# Patient Record
Sex: Male | Born: 1950 | Race: White | Hispanic: No | Marital: Married | Smoking: Former smoker
Health system: Southern US, Community
[De-identification: ages and names within clinical notes are randomized; demographics above are authoritative.]

## PROBLEM LIST (undated history)

## (undated) DIAGNOSIS — G8220 Paraplegia, unspecified: Secondary | ICD-10-CM

## (undated) DIAGNOSIS — I4891 Unspecified atrial fibrillation: Secondary | ICD-10-CM

## (undated) DIAGNOSIS — S24103A Unspecified injury at T7-T10 level of thoracic spinal cord, initial encounter: Secondary | ICD-10-CM

## (undated) DIAGNOSIS — I509 Heart failure, unspecified: Secondary | ICD-10-CM

## (undated) DIAGNOSIS — I2510 Atherosclerotic heart disease of native coronary artery without angina pectoris: Secondary | ICD-10-CM

## (undated) DIAGNOSIS — I10 Essential (primary) hypertension: Secondary | ICD-10-CM

## (undated) HISTORY — PX: BACK SURGERY: SHX140

## (undated) HISTORY — PX: APPENDECTOMY: SHX54

## (undated) HISTORY — PX: CHOLECYSTECTOMY: SHX55

## (undated) HISTORY — PX: HERNIA REPAIR: SHX51

## (undated) HISTORY — PX: VARICOSE VEIN SURGERY: SHX832

---

## 2004-06-18 ENCOUNTER — Inpatient Hospital Stay (HOSPITAL_COMMUNITY): Admission: EM | Admit: 2004-06-18 | Discharge: 2004-06-20 | Payer: Self-pay | Admitting: Emergency Medicine

## 2004-06-20 ENCOUNTER — Encounter (INDEPENDENT_AMBULATORY_CARE_PROVIDER_SITE_OTHER): Payer: Self-pay | Admitting: *Deleted

## 2004-07-09 ENCOUNTER — Ambulatory Visit (HOSPITAL_COMMUNITY): Admission: RE | Admit: 2004-07-09 | Discharge: 2004-07-10 | Payer: Self-pay | Admitting: Cardiology

## 2004-11-15 ENCOUNTER — Inpatient Hospital Stay (HOSPITAL_COMMUNITY): Admission: EM | Admit: 2004-11-15 | Discharge: 2004-11-16 | Payer: Self-pay | Admitting: Emergency Medicine

## 2005-02-18 ENCOUNTER — Ambulatory Visit (HOSPITAL_COMMUNITY): Admission: RE | Admit: 2005-02-18 | Discharge: 2005-02-18 | Payer: Self-pay | Admitting: Neurosurgery

## 2005-10-11 ENCOUNTER — Ambulatory Visit (HOSPITAL_COMMUNITY): Admission: RE | Admit: 2005-10-11 | Discharge: 2005-10-11 | Payer: Self-pay | Admitting: Neurosurgery

## 2006-10-01 ENCOUNTER — Emergency Department (HOSPITAL_COMMUNITY): Admission: EM | Admit: 2006-10-01 | Discharge: 2006-10-01 | Payer: Self-pay | Admitting: Emergency Medicine

## 2007-01-21 ENCOUNTER — Observation Stay (HOSPITAL_COMMUNITY): Admission: EM | Admit: 2007-01-21 | Discharge: 2007-01-23 | Payer: Self-pay | Admitting: Emergency Medicine

## 2007-04-09 DIAGNOSIS — G8220 Paraplegia, unspecified: Secondary | ICD-10-CM

## 2007-04-09 HISTORY — DX: Paraplegia, unspecified: G82.20

## 2007-09-21 ENCOUNTER — Ambulatory Visit: Payer: Self-pay | Admitting: Physical Medicine & Rehabilitation

## 2007-09-21 ENCOUNTER — Inpatient Hospital Stay (HOSPITAL_COMMUNITY)
Admission: RE | Admit: 2007-09-21 | Discharge: 2007-10-13 | Payer: Self-pay | Admitting: Physical Medicine & Rehabilitation

## 2007-11-11 ENCOUNTER — Inpatient Hospital Stay (HOSPITAL_COMMUNITY): Admission: AD | Admit: 2007-11-11 | Discharge: 2007-12-08 | Payer: Self-pay | Admitting: Internal Medicine

## 2007-11-12 ENCOUNTER — Encounter (INDEPENDENT_AMBULATORY_CARE_PROVIDER_SITE_OTHER): Payer: Self-pay | Admitting: Surgery

## 2008-02-09 ENCOUNTER — Inpatient Hospital Stay (HOSPITAL_COMMUNITY): Admission: EM | Admit: 2008-02-09 | Discharge: 2008-02-10 | Payer: Self-pay | Admitting: Emergency Medicine

## 2008-07-25 ENCOUNTER — Ambulatory Visit (HOSPITAL_COMMUNITY): Admission: RE | Admit: 2008-07-25 | Discharge: 2008-07-25 | Payer: Self-pay | Admitting: Plastic Surgery

## 2009-02-01 ENCOUNTER — Encounter (HOSPITAL_BASED_OUTPATIENT_CLINIC_OR_DEPARTMENT_OTHER): Admission: RE | Admit: 2009-02-01 | Discharge: 2009-04-26 | Payer: Self-pay | Admitting: Internal Medicine

## 2009-04-27 ENCOUNTER — Encounter (HOSPITAL_BASED_OUTPATIENT_CLINIC_OR_DEPARTMENT_OTHER): Admission: RE | Admit: 2009-04-27 | Discharge: 2009-07-04 | Payer: Self-pay | Admitting: Internal Medicine

## 2009-07-26 ENCOUNTER — Encounter (HOSPITAL_BASED_OUTPATIENT_CLINIC_OR_DEPARTMENT_OTHER): Admission: RE | Admit: 2009-07-26 | Discharge: 2009-10-24 | Payer: Self-pay | Admitting: Internal Medicine

## 2009-10-26 ENCOUNTER — Encounter (HOSPITAL_BASED_OUTPATIENT_CLINIC_OR_DEPARTMENT_OTHER): Admission: RE | Admit: 2009-10-26 | Discharge: 2010-01-08 | Payer: Self-pay | Admitting: Internal Medicine

## 2010-01-09 ENCOUNTER — Ambulatory Visit (HOSPITAL_COMMUNITY): Admission: RE | Admit: 2010-01-09 | Discharge: 2010-01-09 | Payer: Self-pay | Admitting: Internal Medicine

## 2010-01-10 ENCOUNTER — Encounter (HOSPITAL_BASED_OUTPATIENT_CLINIC_OR_DEPARTMENT_OTHER)
Admission: RE | Admit: 2010-01-10 | Discharge: 2010-03-29 | Payer: Self-pay | Source: Home / Self Care | Attending: Internal Medicine | Admitting: Internal Medicine

## 2010-04-18 ENCOUNTER — Encounter (HOSPITAL_BASED_OUTPATIENT_CLINIC_OR_DEPARTMENT_OTHER)
Admission: RE | Admit: 2010-04-18 | Discharge: 2010-05-08 | Payer: Self-pay | Source: Home / Self Care | Attending: Internal Medicine | Admitting: Internal Medicine

## 2010-04-29 ENCOUNTER — Encounter: Payer: Self-pay | Admitting: Orthopedic Surgery

## 2010-04-29 ENCOUNTER — Encounter: Payer: Self-pay | Admitting: Neurosurgery

## 2010-05-24 ENCOUNTER — Encounter (HOSPITAL_BASED_OUTPATIENT_CLINIC_OR_DEPARTMENT_OTHER): Payer: 59 | Attending: Internal Medicine

## 2010-05-24 DIAGNOSIS — L89109 Pressure ulcer of unspecified part of back, unspecified stage: Secondary | ICD-10-CM | POA: Insufficient documentation

## 2010-05-24 DIAGNOSIS — L89899 Pressure ulcer of other site, unspecified stage: Secondary | ICD-10-CM | POA: Insufficient documentation

## 2010-05-24 DIAGNOSIS — L8990 Pressure ulcer of unspecified site, unspecified stage: Secondary | ICD-10-CM | POA: Insufficient documentation

## 2010-06-08 ENCOUNTER — Other Ambulatory Visit: Payer: Self-pay | Admitting: Family Medicine

## 2010-06-08 DIAGNOSIS — N2889 Other specified disorders of kidney and ureter: Secondary | ICD-10-CM

## 2010-06-13 ENCOUNTER — Ambulatory Visit
Admission: RE | Admit: 2010-06-13 | Discharge: 2010-06-13 | Disposition: A | Discharge status: Home / Self Care | Payer: 59 | Source: Ambulatory Visit | Attending: Family Medicine | Admitting: Family Medicine

## 2010-06-13 ENCOUNTER — Encounter (HOSPITAL_BASED_OUTPATIENT_CLINIC_OR_DEPARTMENT_OTHER): Payer: 59 | Attending: Internal Medicine

## 2010-06-13 DIAGNOSIS — L8990 Pressure ulcer of unspecified site, unspecified stage: Secondary | ICD-10-CM | POA: Insufficient documentation

## 2010-06-13 DIAGNOSIS — L89109 Pressure ulcer of unspecified part of back, unspecified stage: Secondary | ICD-10-CM | POA: Insufficient documentation

## 2010-06-13 DIAGNOSIS — N2889 Other specified disorders of kidney and ureter: Secondary | ICD-10-CM

## 2010-06-13 DIAGNOSIS — L89899 Pressure ulcer of other site, unspecified stage: Secondary | ICD-10-CM | POA: Insufficient documentation

## 2010-06-21 LAB — BASIC METABOLIC PANEL
BUN: 7 mg/dL (ref 6–23)
Calcium: 8.9 mg/dL (ref 8.4–10.5)
Creatinine, Ser: 0.47 mg/dL (ref 0.4–1.5)
GFR calc non Af Amer: >60 mL/min (ref >60)
Glucose, Bld: 114 mg/dL — ABNORMAL HIGH (ref 70–99)
Potassium: 3.1 mEq/L — ABNORMAL LOW (ref 3.5–5.1)

## 2010-07-19 ENCOUNTER — Encounter (HOSPITAL_BASED_OUTPATIENT_CLINIC_OR_DEPARTMENT_OTHER): Payer: 59 | Attending: Internal Medicine

## 2010-07-19 DIAGNOSIS — Z7982 Long term (current) use of aspirin: Secondary | ICD-10-CM | POA: Insufficient documentation

## 2010-07-19 DIAGNOSIS — I10 Essential (primary) hypertension: Secondary | ICD-10-CM | POA: Insufficient documentation

## 2010-07-19 DIAGNOSIS — L8990 Pressure ulcer of unspecified site, unspecified stage: Secondary | ICD-10-CM | POA: Insufficient documentation

## 2010-07-19 DIAGNOSIS — Z8673 Personal history of transient ischemic attack (TIA), and cerebral infarction without residual deficits: Secondary | ICD-10-CM | POA: Insufficient documentation

## 2010-07-19 DIAGNOSIS — Z79899 Other long term (current) drug therapy: Secondary | ICD-10-CM | POA: Insufficient documentation

## 2010-07-19 DIAGNOSIS — L89609 Pressure ulcer of unspecified heel, unspecified stage: Secondary | ICD-10-CM | POA: Insufficient documentation

## 2010-07-19 DIAGNOSIS — Z7902 Long term (current) use of antithrombotics/antiplatelets: Secondary | ICD-10-CM | POA: Insufficient documentation

## 2010-07-19 DIAGNOSIS — L89899 Pressure ulcer of other site, unspecified stage: Secondary | ICD-10-CM | POA: Insufficient documentation

## 2010-08-09 ENCOUNTER — Encounter (HOSPITAL_BASED_OUTPATIENT_CLINIC_OR_DEPARTMENT_OTHER): Payer: 59 | Attending: Internal Medicine

## 2010-08-09 DIAGNOSIS — I10 Essential (primary) hypertension: Secondary | ICD-10-CM | POA: Insufficient documentation

## 2010-08-09 DIAGNOSIS — Z7902 Long term (current) use of antithrombotics/antiplatelets: Secondary | ICD-10-CM | POA: Insufficient documentation

## 2010-08-09 DIAGNOSIS — L8990 Pressure ulcer of unspecified site, unspecified stage: Secondary | ICD-10-CM | POA: Insufficient documentation

## 2010-08-09 DIAGNOSIS — L89899 Pressure ulcer of other site, unspecified stage: Secondary | ICD-10-CM | POA: Insufficient documentation

## 2010-08-09 DIAGNOSIS — L89609 Pressure ulcer of unspecified heel, unspecified stage: Secondary | ICD-10-CM | POA: Insufficient documentation

## 2010-08-09 DIAGNOSIS — Z8673 Personal history of transient ischemic attack (TIA), and cerebral infarction without residual deficits: Secondary | ICD-10-CM | POA: Insufficient documentation

## 2010-08-09 DIAGNOSIS — Z7982 Long term (current) use of aspirin: Secondary | ICD-10-CM | POA: Insufficient documentation

## 2010-08-09 DIAGNOSIS — Z79899 Other long term (current) drug therapy: Secondary | ICD-10-CM | POA: Insufficient documentation

## 2010-08-21 NOTE — H&P (Signed)
NAMEFARID, [name]                  ACCOUNT NO.:  [id number]   MEDICAL RECORD NO.:  [id number]          PATIENT TYPE:  INP   LOCATION:  1616                         FAC[facility]  [facility]   PHYSICIAN:  Corinna L. Lendell Caprice, MDDATE OF BIRTH:  07/31/1950   DATE OF ADMISSION:  02/09/2008  DATE OF DISCHARGE:                              HISTORY & PHYSICAL   CHIEF COMPLAINT:  Weakness, altered mental status, vomiting, and  diarrhea.   HISTORY OF PRESENT ILLNESS:  Mr. [name] is a 60 year old white male with  a very complex medical history who presented to the emergency room via  EMS with the above complaints.  Much of the history is per the wife.  I  am familiar with the patient from previous hospitalization.  He  reportedly had some nausea the night before admission, which got better  with Compazine.  He takes Senokot and just to be manually disimpacted  daily due to spinal cord injury.  He had a scheduled visit to the  surgeon today to evaluate a chronic buttock decubitus and was being  transported via EMS as usual.  Apparently, he was in the parking lot,  was incontinent of loose stool.  He vomited.  He felt very weak, and he  had decreased level of consciousness, and therefore asked to be sent to  the emergency room rather than to see the surgeon.  He denies any  fevers, chills, cough, rhinorrhea, headache, and myalgias other than his  usual chronic pain.  He has had no sick contacts that he knows of.  He  apparently was quite sleepy in the emergency room and had a neurologic  workup as well.   PAST MEDICAL HISTORY:  1. 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  2. 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  3. Neurogenic bowel and bladder.  4. Chronic anemia.  5. Hypertension.  6. History of hyponatremia.  7. Multiple back surgeries.  8. Chronic sinusitis.  9. Chronic  peripheral edema.  10.Gastroesophageal reflux disease.  11.Coronary artery disease with stent.  12.Morbid obesity.  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   MEDICATIONS:  The patient is currently tapering down/off his prednisone.  He had been on a total of 60 mg a day and currently is taking 20 mg a  day.  He has tapered off Lyrica.  He and his wife preferred to do this  due to his swelling.  He is no longer on Plavix.  He is no longer on  triamterene hydrochlorothiazide.  He continues to takes [place] roughly 4  times a day and Tylenol #4, 3 tablets 4 times a day.  He has been on  Augmentin 1000 mg twice a day since September.  Apparently, he takes  this as needed for symptoms, but it is unclear to me, how he is able to  determine his prostatitis symptoms.  Diazepam 10 mg nightly, Avapro 300  mg a day, Nexium 40  mg twice a day, benazepril 40 mg a day, terazosin 4  mg nightly, Cartia XT they believe 180 mg a day, Carafate 1 g usually in  the morning but also q.i.d. as needed, Imodium as needed, Compazine as  needed, hydrocodone/aspirin as needed, Zyrtec 10 mg as needed,  amlodipine 5 mg daily, and Lasix 40 mg twice a day.   SOCIAL HISTORY:  Reviewed and as per previous.   FAMILY HISTORY:  Noncontributory.   REVIEW OF SYSTEMS:  As above, otherwise negative.  Although, history is  somewhat difficult to obtain as the patient is still slightly somnolent.   PHYSICAL EXAMINATION:  VITAL SIGNS:  Temperature is 98.3, blood pressure  ranged 88/52 to 94/42 in the emergency room, heart rate 84, respiratory  rate 22, and oxygen saturation 99% on 2 L nasal cannula.  GENERAL:  The patient is an obese white male who is sleepy but will  arouse to voice and answer a few questions but quickly falls back  asleep.  HEENT:  Normocephalic, atraumatic.  Pupils equal, round, and reactive to  light.  Sclerae nonicteric.  Moist mucous membranes.  NECK:  Supple.  No   lymphadenopathy.  LUNGS:  Clear to auscultation anteriorly without wheezes, rhonchi, or  rales.  CARDIOVASCULAR:  Regular rate and rhythm without murmurs, gallops, or  rubs.  ABDOMEN:  Normal bowel sounds, soft, nontender, and nondistended.  Obese.  GU:  He has a Foley catheter draining concentrated amber urine without  any sediment.  RECTAL:  Deferred.  SKIN:  He has a large stage IV decubitus over his left buttock, which  has a clean base and surrounding erythema, which apparently is improved  from previous.  I did not measure it with a ruler but it appears  approximately 8 x 8 cm and appears cleaner than upon discharge several  months ago.  EXTREMITIES:  He has 3+ pitting edema.  NEUROLOGIC:  Sleepy oriented upper motor strength intact.  Lower  extremity strength not tested.  PSYCHIATRIC:  The patient is cooperative.   LABORATORY DATA:  White blood cell count is 17,000 with 80% neutrophils,  12% lymphocytes, hemoglobin 11.3, hematocrit 35, and platelet count 266.  ABG on 2 L nasal cannula oxygen shows a pH of 7.32, pCO2 of 51, and pO2  of 68.8.  Sodium 133, glucose 124, BUN 3.3, creatinine 1.37, albumin  2.9, otherwise unremarkable.  Acetaminophen less than 10.  Urine drug  screen positive for benzos and opiates.  The urinalysis is orange  turbid, specific gravity 1.030, trace ketones, small bilirubin, moderate  blood, 100 protein, large leukocyte esterase, calcium oxalate crystals,  white count 21-50, many bacteria.  CT of the brain shows nothing acute,  motion degradation, one view of the chest shows cardiomegaly and  pulmonary vascular congestion.  EKG shows normal sinus rhythm.   ASSESSMENT AND PLAN:  1. Vomiting and diarrhea with altered mental status and borderline low      blood pressures:  The patient will be admitted to Telemetry.  I      will hold all his antihypertensives, and he will get supportive      care.  Check stool for C. diff as he is currently on  Augmentin.  2. Altered mental status secondary to above, plus medications.      Apparently, he has received Dilaudid and antiemetic here in the      emergency room.  3. Bacteriuria in a patient with a chronic indwelling Foley catheter:  This is difficult to interpret.  However, I will place him on      empiric vancomycin and cefepime for now.  I will order urine      culture and monitor her white count.  4. Chronic steroids:  Certainly, this could be contributing to some of      his symptoms, particularly the relatively low blood pressure.  He      has received a dose of 125 mg of IV Solu-Medrol here in the      emergency room.  For now I will discontinue his prednisone at 20 mg      and monitor pressures.  5. Hypertension.  See above.  6. Incomplete T12 spinal cord injury with neurogenic bowel and      bladder.  7. Chronic pain.  8. Peripheral edema.  Continue Lasix.  9. History of coronary artery disease with stent.  The patient has not      had any chest pain.  10.Chronic anemia.  11.Morbid obesity.  12.Chronic stage IV buttock decubitus:  For now, he will get wet-to-      dry dressings.  Apparently, this is improved from previous, and      there is no purulent drainage.  He does have some erythema      surrounding the wound, but this appears more to be maceration      rather than cellulitis.  13.Gastroesophageal reflux disease:  Continue outpatient medications.      Corinna L. Lendell Caprice, MD  Electronically Signed     CLS/MEDQ  D:  02/10/2008  T:  02/11/2008  Job:  [id number]   cc:   Otilio Connors. Gerri Spore, M.D.  Wilmon Arms. Tsuei, M.D.

## 2010-08-21 NOTE — Op Note (Signed)
[name], [name]                  ACCOUNT NO.:  [id number]   MEDICAL RECORD NO.:  [id number]          PATIENT TYPE:  INP   LOCATION:  5038                         FACILITY:  [facility]   PHYSICIAN:  Wilmon Arms. Corliss Skains, M.D. DATE OF BIRTH:  06/13/1950   DATE OF PROCEDURE:  11/12/2007  DATE OF DISCHARGE:                               OPERATIVE REPORT   PREOPERATIVE DIAGNOSIS:  Sacral decubitus ulcer.   POSTOPERATIVE DIAGNOSIS:  Sacral decubitus ulcer.   PROCEDURE PERFORMED:  Debridement of sacral decubitus ulcer 13 x 13 x  2.5 cm.   SURGEON:  Wilmon Arms. Corliss Skains, MD   ASSISTANT:  Letha Cape, PA-C   ANESTHESIA:  General endotracheal.   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  He has developed a large ulcer on  the left side of his sacrum.  We are asked to consult for possible  debridement.  The thick eschar on the top of ulcer measures about 13 cm  in diameter.   DESCRIPTION OF PROCEDURE:  The patient was brought to the operating  room, placed in supine position on a stretcher.  After an adequate level  of general anesthesia was obtained, the patient's position was prone  position on the bed.  His buttocks were prepped with Betadine and draped  in sterile fashion.  A time-out was taken to assure the proper patient  and proper procedure.  We then used the cautery to remove the entire  thickness of the eschar circumferentially.  We then dissected down  underneath all of the necrotic tissue with cautery.  We removed all the  tissue down to the gluteus muscle.  The gluteus muscle contracted  somewhat with cautery, but did not overall appear healthy.  However, was  not frankly necrotic or nonviable, so was left alone.  There was one  area superiorly and laterally that seemed to track a little bit deeper  in this area was debrided back to healthy adipose tissue.  We then pulse  lavaged the entire wound thoroughly.  The wound  was then packed with a  thick layer of saline-soaked Kerlix.  ABD dressing was applied.  The  patient was then extubated, brought to recovery in stable condition.  All sponge, instrument, and needle counts were correct.      Wilmon Arms. Tsuei, M.D.  Electronically Signed     MKT/MEDQ  D:  11/12/2007  T:  11/13/2007  Job:  [id number]

## 2010-08-21 NOTE — Discharge Summary (Signed)
[name], [name]                  ACCOUNT NO.:  [id number]   MEDICAL RECORD NO.:  [id number]          PATIENT TYPE:  IPS   LOCATION:  4005                         FACILITY:  [facility]   PHYSICIAN:  Ranelle Oyster, M.D.DATE OF BIRTH:  11/11/1950   DATE OF ADMISSION:  09/21/2007  DATE OF DISCHARGE:  10/13/2007                               DISCHARGE SUMMARY   DISCHARGE DIAGNOSES:  1. 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  2. Hypertension.  3. Chronic pain issues.  4. Neurogenic bowel and bladder.  5. Acute blood loss anemia.  6. Upper respiratory infection, resolved.  7. Sacral decubitus.  8. Wound prophylaxis.   HISTORY OF PRESENT ILLNESS:  [name] is a 60 year old male with  history of 11 prior back surgeries and initially referred to [facility] in [place] for cervical stenosis and myelopathy.  The patient with history of declining mobility with progressive lower  extremity weakness and inability to walk since December 2008.  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  The  patient was admitted for workup and monitoring approximately 12 hours,  as he showed no improvement in his lower extremity strength.  He was  taken to OR on June 4 for partial laminectomy of T12 with takedown of  T12 fusion and PLIF from T9-T12 for T11-T12 paraplegia.  The patient has  had some return of sensation and has been able to have flicker of  movement in the right foot.  He currently continues to have issues with  pain management and has been assisting on IM Demerol and IV Valium for  pain control.  He reports these have made symptoms tolerable.  Cardiology was consulted for workup of bradycardia and the  patient's  beta blocker dose was decreased.  Neurology was consulted for input on  paraplegia and questionable etiology of the patient's acute paraparesis.  Currently PT/OT is ongoing with the patient working on balance at the  edge of the bed with assist.   PAST MEDICAL HISTORY:  1. Coronary artery disease with PTCA.  2. Laparoscopic cholecystectomy.  3. Ventral hernia repair x7.  4. Back surgeries x11.  5. Chronic pain.  6. History of prostatitis.  7. Falls.  8. Insomnia.  9. Excision of basal cell cancer.  10.Presbyopia.  11.GERD.   ALLERGIES:  Intolerance to CATAPRES, LIPITOR, DARVOCET, DECADRON, and  CIPRO.   FAMILY HISTORY:  Questionable for coronary artery disease.   SOCIAL HISTORY:  The patient is married, has been disabled since 1982,  lives in 3-level home with 11 steps at entry, and wife is currently  unemployed.  The patient is to smoke half pack per day and rare use of  alcohol.  Has to use cane walker but decrease in ambulating distance to  household distances since November 2008.  Currently still drives.   HOSPITAL COURSE:  [name] was admitted to rehab on September 21, 2007  for inpatient therapies to consist of PT and OT daily.  At time of  admission, long discussion regarding pain management was carried out.  We reported a preference not to use IM Demerol.  The patient initially  stated his OxyContin CR was giving him no effectual relief due to back  pain.  He preferred going back to his home regimen of Tylenol #4 with  Percocet.  This was initially tried in addition to Lyrica 75 mg t.i.d.  and Soma 350 mg 3 pills 4 times a day.  Dr. Marya Amsler office was  contacted regarding the patient's prior pain management to confirm doses  of his pain medicines.  The patient continued to have issues regarding  pain management consistently rating his pain at 8-9 secondary to  excessive Tylenol use.  He was changed out from Tylenol #4 to OxyContin  CR and dose was  slowly titrated to 80 mg q.12 h with oxycodone 30 mg to  be used q. 6-8 h p.r.n. pain.  The patient's back incision has been  monitored along during this stay at time of admission.  One-third of the  incision stood staples in place with some serous drainage on dressing  with some gapping edges.  Lower two-thirds of incision was intact.  The  patient's drainage did resolve in 24-48 hours.  On June 28, the patient  was noted to have recurrent drainage from back incision with large  amount of serous drainage on dressing.  He was started on Keflex for  wound prophylaxis on June 28.  Wound drainage was cultured.  Wound  culture had shown multiple organisms without predominance and no staph  or strep noted.  By the time of discharge, the patient's staples had  been removed and wound drainage had resolved with some mild dehiscence  of wound medially.  The patient has been instructed regarding pressure  relief measures by nursing as well as PT and OT.  He prefers to lie on  his back despite multiple instructions for side lying for pressure  relief measures.  He is also encouraged to boost while in chair.  The  patient was noted to have an abrasion to left buttock while  transferring.  This was initially treated with Mepilex and air-mattress  overlay.  Follow up of wound prior to discharge shows 6 x 8 cm 100%  necrotic area without any odor, minimal exudate.  We also recommended  Santyl to deal and debride the wound as well as repositioning of the  area.  Home health RN has been arranged for wound care and wound  monitoring past discharge.  The patient does have a hospital bed ordered  to use at home.  An air-mattress overlay has also been recommended,  however, both the patient and wife declined this secondary to heat and  concerns regarding its use.   The patient was noted to be hypotensive at time of admission with blood  pressures in 90s/50s.  Question related to his Demerol and IV Valium   use.  His BP has been monitored on b.i.d. basis and BP meds have slowly  been resumed.  At time of discharge, blood pressures were ranging from  120-130 systolics, 60s-70s diastolic.  Heart rate averaging from 60s-80s  range.  The patient's p.o. intake has been good.  Labs were done at  admission revealing H&H at 9.2 and 26.7, white count was noted be  elevated at 20.3,  platelets at 227.  Check of lytes revealed sodium 133,  potassium 4.6, chloride 96, CO2 28, BUN 40, creatinine 1.62, glucose  103.  LFTs showed elevated alkaline phos at 100, SGOT 74, SGPT 77, total  protein 2.3, albumin 2.7.  The patient's leukocytosis has been  monitored.  It was felt that this was due to his prednisone 20 mg b.i.d.  Dr. Laural Benes was contacted regarding prednisone use and he recommended  tapering this.  This was done with large dose of prednisone on June 7  prior to discharge.  The patient has had serial CBCs during his stay and  subcu Lovenox has been used for DVT prophylaxis and also additionally to  make sure the patient did not have blood loss.  Repeat CBC on July 3  showed some drop with H&H at 8.3 and 24.6.  The patient was transfused  with 2 units of packed red blood cells.  Last CBC was checked on July 7  prior to discharge.  This revealed hemoglobin 9.9, hematocrit 29.4,  white count 5.0, platelets 123.  Last check of lytes from July 6 reveals  sodium 131, potassium 4.1, chloride 97, CO2 28, BUN 8, creatinine 0.58,  glucose 106.  The patient was started on bowel program initially to help  with elimination.  He reported history of constipation.  Noted to have  early ileus at time of admission.  He was started on Reglan as well as  soapsuds enema first 2 days past admission.  At time of discharge, the  patient's bowel program consisted of Senokot-S two p.o. b.i.d., in  addition to digi stim in the morning followed by Dulcolax suppository if  no results at this.  He is noted to have some hard stools.   MiraLax 17  grams daily additionally was added at the time of discharge.  The  patient's Foley was discontinued on July 1.  The patient was noted to  have a neurogenic bladder, has not had any voids, or any accidents.  He  has required in-and-out caths q.4 h.  He is noted to have fluid intake  of 3-4 L a day.  He has been instructed regarding cutting down on his  fluid intake to help with cath volume.  He has decreased cath volumes  and also to help spread out caths.  Currently, his volumes are ranging  from 400-500 mL.  He at time of discharge was advised to cut down his  p.o. fluid intake to 2 L a day to help with bladder program.  Both the  patient and wife have been instructed regarding in-and-out cath by  nursing.  They have also been instructed regarding bowel program and  pressure relief measures.  Option of having a Foley placed for an  indwelling Foley placed to help with ease of care was brought up to  them, however, both the patient and wife have declined this.  The  patient has been advised to set up a time of q.4 h to remember cath  schedule.   The patient did participate in 3 hours of therapy 5 days a week during  this stay.  He was noted to have some upper respiratory type infections  on June 30 and July 1.  PT/OT worked with the patient to provide in-room  therapies to help secondary to the patient's poor tolerance.  Chest x-  ray was done showing decreased lung volumes with mild bibasilar  atelectasis.  CBC check on July 1 showed white count  on downward trend  at 10.8.  He was noted to have low-grade temperature of 100.7.  I expect  the patient symptoms secondary to upper respiratory symptoms.  The  patient had been started on Keflex on June 28 and expected this to cover  any type of respiratory infection as well as wound prophylaxis.  The  patient was also started on Allegra-D to help with his congestion as  patient with history of allergic rhinitis.  The patient's  respiratory  symptoms were improved by the time of discharge.  The patient does  continue with dense paraparesis.  Leg loops were made to help the  patient maneuver, bilateral lower extremities.  Strength at time of  discharge remains at 0/5 bilaterally.  At time of admission, the patient  was noted to have decrease in sitting balance score as well as for poor  core as well as poor truncal control with decreased endurance and was  max to total assist for functional mobility.  The patient made steady  progress towards goals.  A home evaluation was additionally completed  with modifications recommended regarding concrete pathway, a ramp as  well as widened doorways for access to rooms in first level.  Hospital  bed with trapeze was also ordered to help with transfers.  The patient  and wife participated in extensive education regarding positioning,  wheelchair use, pressure relief measures, transfers, bed mobility,  safety, and precautions.  At time of discharge, the patient was min to  mod assist for bed mobility, min assist for bed-chair transfers, and  modified independent for use of wheelchair on unit.  He requires  intermittent assistance for ramp and entry into van.  Supervision for  dynamic sitting balance.  OT has been working with the patient regarding  self-care, toileting, endurance, core, and balance issues.  The patient  has made slow steady gait gains requiring mod assist with exception of  supervision for upper body dressing, min assist for toilet transfers,  and max assist for simulated shower transfers.  The patient has been  instructed and taught about providing instructions regarding directing  his self-care.  He will continue to receive further followup home health  PT, OT by [facility] past discharge.  Home health RN has been  arranged for wound monitoring as well for further assistance with bowel  and bladder program as needed.  On October 13, 2007, the patient  was  discharged to home.   DISCHARGE MEDICATIONS:  1. Soma 350 mg 3 pills p.o. q.6 h.  2. Mobic 7.5 mg a day.  3. Cardura 2 mg at bedtime.  4. Nexium 40 mg a day.  5. Protonix 40 mg a day.  6. Carafate 1 gram before meals at bedtime.  7. Cardizem CD 180 mg a day.  8. Lovenox 40 mg subcu daily for at least 2 months.  9. Lyrica 75 mg q.8 h.  10.Norvasc 5 mg a day.  11.Plavix 75 mg a day.  12.Valium 10 mg b.i.d.  13.Allegra D one p.o. b.i.d.  14.OxyContin CR 80 mg q.12 h. #60 prescribed.  15.Oxycodone IR 30 mg one q.6-8 h. p.r.n. pain #90 prescribed.  16.MiraLax 17 grams in 8 ounces fluid per day.  17.Senokot-S two p.o. b.i.d.  18.Keflex 250 mg p.o. q.6 h. x2 additional weeks.  19.Mucinex LA 600 mg b.i.d.  20.Flonase 2 squirts in each nostril b.i.d.  21.ProctoFoam HC to rectum b.i.d. p.r.n. irritation or bleeding.  22.Multivitamin one per day.   DIET:  Regular.  Limit fluid intake to 2 L daily.   WOUND CARE:  Keep back incision clean and dry.  Wash with antibacterial  soap and water.  Pat dry.  Apply dry dressing.  Left buttock wound to be  treated with Santyl with damp to dry dressing changes daily.   SPECIAL INSTRUCTIONS:  A 24-hour supervision assistance.  No alcohol, no  smoking, no driving cath q.4 h at time.  Needs to be boosted in chair  every 30 minutes or turned q.2 h when in bed.  PRAFOs on feet at night.  Digi stim q.6 h for bowel program with suppositories if no results.  Do  not use Benicar, lisinopril, Maxzide, Prevacid, or Tylenol No. 3.  May  resume aspirin 81 mg p.o. per day.  Monitor for any signs of bleeding.  [facility] to provide PT, OT, and RN.   FOLLOWUP:  The patient is to follow up with Dr. Riley Kill, August 5 at  9:20.  Follow up with Dr. Gerri Spore for routine check in the next  couple weeks.  Follow up with Dr. Laural Benes in the next 2 weeks.      Greg Cutter, P.A.      Ranelle Oyster, M.D.  Electronically Signed    PP/MEDQ   D:  10/13/2007  T:  10/14/2007  Job:  [id number]   cc:   Otilio Connors. Gerri Spore, M.D.  Armanda Magic, M.D.

## 2010-08-21 NOTE — Discharge Summary (Signed)
NAMENAHSIR, VENEZIA                  ACCOUNT NO.:  [id number]   MEDICAL RECORD NO.:  [id number]          PATIENT TYPE:  INP   LOCATION:  4737                         FACILITY:  [facility]   PHYSICIAN:  Kela Millin, M.D.DATE OF BIRTH:  11-19-1950   DATE OF ADMISSION:  01/21/2007  DATE OF DISCHARGE:  01/21/2007                               DISCHARGE SUMMARY   CORRECTION : THIS IS THE ADMISSION HISTORY AND PHYSICAL   PRIMARY CARE PHYSICIAN:  Dr. Otilio Connors. Gerri Spore, M.D.   CHIEF COMPLAINT:  Altered mental status.   HISTORY OF PRESENT ILLNESS:  The patient is a 60 year old white male  with past medical history significant for chronic back and neck pain,  status post low back surgeries on chronic pain medications. Also history  of hypertension, coronary artery disease - status post stent, GERD,  dyslipidemia and history of Bell's palsy who presents with the above  complaints.  His wife is at the bedside assisting with a history.  The  patient states that he was in his usual state of health until today when  he saw his primary care physician at the office and was given a  prescription for prednisone. He states that after filling the  prescription he went home and took three of the tablets after lunch as  the pharmacist had advised him to do.  He reports that he took the three  prednisones along with his usual three Tylenol #4 and three 350 mg Soma  tablets. He states that he did well until about 4-5 hours later when his  next dose of the Tylenol #4 and Soma were due, and he took the same  amounts as already mentioned.  His wife states that shortly after he had  taken the second dose of his pain medications for that afternoon she  came back and found that he was out of it.  She reports that he was  sleepy and his breathing was shallow and he was trying to talk to her  but not making much sense appeared to be having difficulty finding his  words.  She called EMS and upon arrival EMS  personnel reported altered  level of consciousness and they gave him a dose of Narcan.  Per the ER  staff the patient was mildly aroused by a sternal rub initially upon  arrival to the ER, and per the ER physician he was given another dose of  Narcan.  The patient became progressively more alert following this  second dose and at the time I examined the patient in the ER he was  alert and oriented, answering questions appropriately, and giving much  of the history.  The patient had a CT scan of his head done which showed  atrophy with small-vessel ischemic changes, no acute intracranial  findings.  A chest x-ray was done which showed a new right perihilar  infiltrate, possible aspiration per radiologist.  It is noted that  besides the Soma and Tylenol #4 mentioned, the patient's medication list  also includes diazepam and Endodan.  His urine drug screen was positive  for  benzodiazepines and opiates.  Urinalysis negative for infection.  He  is admitted for further evaluation and management.   PAST MEDICAL HISTORY:  As above.   MEDICATIONS:  1. Soma 350 mg p.r.n.  2. Tylenol #4 three tablets q.i.d.  3. Maxzide 75/50 mg daily.  4. Augmentin XR b.i.d.  5. Diazepam 10 mg p.r.n.  6. Avapro 300 mg daily.  7. Plavix 75 mg daily.  8. Nexium 40 mg daily.  9. Benazepril 40 mg daily.  10.Terazosin 2 mg daily.  11.Cardia XT daily.  12.Sucralfate 1 gram p.o. q.i.d.  13.Imodium p.r.n.  14.Prochlorperazine 10 mg p.r.n.  15.Endodan 4.83/32 three t.i.d.  16.Zyrtec 10 mg  17.Norvasc 5 mg daily.  18.Zetia 10 mg daily.   ALLERGIES:  DARVOCET, DARVON, DECADRON, INTOLERANCE TO CLONIDINE, CIPRO  AND LIPITOR.  THE PATIENT STATES THAT HE HAS TAKEN PREDNISONE PREVIOUSLY  WITHOUT ANY REACTIONS.   SOCIAL HISTORY:  He denies tobacco.  He denies alcohol.   FAMILY HISTORY:  Mother, melanoma and heart disease.   REVIEW OF SYSTEMS:  As per HPI. Other review of systems negative.   PHYSICAL EXAM:   GENERAL:  In general the patient is a middle-aged white  male, he looks older than his stated age, in no respiratory distress,  alert and oriented x3.  VITAL SIGNS:  His temperature 97.9 with a blood pressure of 132/70,  pulse of 66, respiratory rate is 18, O2 sat of 98%.  HEENT: PERRLA, EOMI, sclerae anicteric, moist mucous membranes.  No oral  exudates.  NECK:  Supple, no adenopathy, no thyromegaly and no JVD.  LUNGS:  Scattered right-sided wheezes, no crackles.  CARDIOVASCULAR:  Regular rate and rhythm.  Normal S1-S2.  ABDOMEN:  Soft, bowel sounds present, nontender, nondistended. No  organomegaly, and no masses palpable.  EXTREMITIES:  No cyanosis and no edema.  NEUROLOGICAL:  He is alert and oriented x3.  Cranial nerves II through  XII were grossly intact. Decreased sensory in both lower extremities  (the patient states that this has been longstanding), strength is about  4-5/5 and symmetric.   LABORATORY DATA:  A CT scan of head and chest x-ray are as per HPI. His  sodium is 138 with a potassium of 4.1, chloride 103, BUN 17, glucose of  127, pH of 7.4, pCO2 of 38.9, hemoglobin is 12.9 with a hematocrit of  38. Point of care markers negative x 1.  Urine drug screen positive for  opiates and benzodiazepines. Otherwise negative.  Alcohol level is less  than 5. Creatinine is 1.0.  Urinalysis was negative for infection.   ASSESSMENT:  1. Altered mental status - likely secondary to medications, possibly      resulting in an aspiration pneumonia.  As noted above the patient      is more alert status post Narcan x2.  He states he also took      prednisone earlier in the day but this would be the less likely      cause.  A CT scan of head is negative.  Urinalysis also negative.      We will hold off sedating medications for now, follow.  2. Hypertension - monitor blood pressures and manage as appropriate.  3. Coronary artery disease - obtain EKG and cardiac enzymes. Continue       outpatient medications.  4. Chronic back pain - Toradol/Skelaxin p.r.n. for now until follow-      up.  5. Gastroesophageal reflux disease - continue proton pump inhibitor.  6.  History of Bell's palsy.      Kela Millin, M.D.  Electronically Signed     ACV/MEDQ  D:  01/22/2007  T:  01/22/2007  Job:  [id number]   cc:   Otilio Connors. Gerri Spore, M.D.

## 2010-08-21 NOTE — Discharge Summary (Signed)
[name], [name]                  ACCOUNT NO.:  [id number]   MEDICAL RECORD NO.:  [id number]          PATIENT TYPE:  INP   LOCATION:  5038                         FACILITY:  [facility]   PHYSICIAN:  Corinna L. Lendell Caprice, MDDATE OF BIRTH:  03/06/1951   DATE OF ADMISSION:  11/11/2007  DATE OF DISCHARGE:  12/08/2007                               DISCHARGE SUMMARY   DISCHARGE DIAGNOSES:  1. Large stage IV left buttock decubitus.  2. Stage II right buttock decubitus.  3. 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  4. Chronic pain.  5. Neurogenic bowel and bladder.  6. Chronic anemia, multifactorial.  7. Hypertension.  8. Noncompliance with activity instructions, daily weights, and fluid      restricted diet.  9. Hyponatremia, secondary to hydrochlorothiazide as well as      polydipsia.  10.Multiple back surgeries.  11.Chronic sinusitis.  12.History of peripheral edema, none currently.  13.Coronary artery disease with stent.  14.Gastroesophageal reflux disease.  15.Morbid obesity.   DISCHARGE MEDICATIONS:  1. Stop Maxzide.  2. Stop benazepril and hydrochlorothiazide.  3. He may continue Tylenol No. 4 one to two p.o. q.6 h. p.r.n. or      Percocet 10/325 one p.o. q.6 h. p.r.n.  4. Continue Soma as previous, which I believe is 350 mg three tablets      every 6 hours as needed.  5. Lyrica 75 mg a day.  6. Compazine as needed.  7. Zyrtec 10 mg a day.  8. Lasix 20 mg as needed for swelling.  9. Rhinocort daily.  10.Albuterol MDI 2 puffs q.i.d. as needed for wheeze.  11.Nitroglycerin sublingually as needed for chest pain.  12.Potassium Chloride 20 mEq daily as needed.  13.Aspirin 325 mg a day.  14.Iron sulfate 325 mg three times a day.  15.Multivitamins a day.  16.B6 vitamin daily.  17.Senokot 2 tablets daily.  18.Mobic 7.5 mg daily.  19.Nexium 40 mg a day.  20.Carafate 1 g daily.  21.Plavix 75 mg a day.  22.Avapro 300 mg a day.  23.Valium 10 mg nightly as  needed.  24.Terazosin 4 mg nightly.  25.Diltiazem CD 180 mg a day.   CONDITION:  Stable.   ACTIVITY:  He is to remain on his side while in bed as much as possible.  Fall precautions.  Wound care.  Home health has been arranged for wet-to-  dry dressings to sacral wound b.i.d. and packed with normal saline-  moistened Kerlix and covered with dry gauze and ABD pad.  Continue using  Montgomery straps as needed.  Apply Mepilex to the right buttock where  skin breakdown is located, once a day.   DIET:  Fluid restricted less than 2 L a day.   Air mattress overlay has been arranged.  Foley is to remain in place  upon transfer and then can be discontinued and he may resume periodic in-  and-out Self-Caths as previous.   CONDITION:  Stable.   CONSULTATIONS:  Surgery.   PROCEDURES:  Multiple sharp debridements of the left buttock wound.  Also, psychiatry consult to rule  out depression, which was essentially  ruled out.   PERTINENT LABORATORIES:  CBC on admission was significant for a normal  white count, hemoglobin was 10.3, hematocrit was 30, and normal platelet  count.  On November 20, 2007, his white count increased to 19,000 with 85%  neutrophils, 10% lymphocytes and his hemoglobin was 8.  At discharge,  his white count is normal, hemoglobin 8.4, and hematocrit 25.  Complete  metabolic panel on admission significant for a sodium of 132, a BUN of  25, and albumin of 3.0.  His sodium dropped to 117 on November 25, 2007,  with a chloride of 79, and at discharge his sodium is 136, off diuretics  and on fluid restriction.  Serum osmolality on the November 25, 2007, was  246, which is low.  BNP was 47, TSH 0.582, and cortisol 20.  Urine  osmolality 407.  Urine sodium 47.  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  Protein C, antithrombin III, and protein S unremarkable.  INR 1.0.  Urinalysis, essentially negative.  Homocystine 9.  Anticardiolipin  antibody negative.  Beta-2 glycoprotein negative.   Wound culture grew  out Pseudomonas, which was pansensitive.  No anaerobes grew out.   SPECIAL STUDY/RADIOLOGY:  EKG showed normal sinus rhythm with right axis  deviation and pulmonary disease pattern, nonspecific changes.  Chest x-  ray showed atelectasis.  X-ray of thoracic spine showed nothing acute.   HISTORY AND HOSPITAL COURSE:  [name] is an unfortunate 60 year old  white male who was admitted to the hospital with a sacral decubitus.  He  has a history of incomplete T12 injury.  He has been unable to walk and  had been at [facility] inpatient rehab.  He was evaluated as an  outpatient a day prior to admission and it was recommended that the  patient be admitted to the hospital for incision and drainage, but be  admitted to the Hospitalist Service.  Surgery was consulted and the  patient initially had an unstageable ulcer.  He had this debrided by Dr.  Corliss Skains  and it was done under general anesthesia.  The ulcer was  reportedly 13 x 13 x 2.5 cm.  The patient was started on antibiotics  initially.  He received local wound care including pulse lavage.  Unfortunately, the patient was very noncompliant with activity.   INSTRUCTIONS:  He was instructed to remain off his buttocks and stay on  his side as much as possible, but he essentially lays in bed on his  buttocks every time, I or the nurses entered his room.  He also refused  an air overlay mattress.  He also refused to get out of bed to work with  physical therapy multiple times.  He refused daily weights.  Because of  this, he did develop some maceration and a stage II decubitus over the  right buttock.  He voiced understanding that his lack of compliance with  the air overlay mattress and remaining off the buttocks was contributing  to this, but he reported that his back pain was so severe.  He was  unable to tolerate any of these measures.  This was despite increasing  his pain medications.  At this point, we have been trying  to get his  wound to the point where he could not have a wound VAC and be discharged  home due to his noncompliance, however and breakdown of the other  buttock, it is felt that a wound VAC is not going to be  appropriate any  times soon and surgery as recommended that he be discharged home with  b.i.d. dressing changes.  He will follow up with Dr. Corliss Skains in a week.  At this point, he has been off antibiotics for several weeks and has  been stable with respect to labs and temperatures.   The patient did develop hyponatremia during his hospitalization and had  his diuretics stopped.  He was put on fluid restricted diet and he was  very noncompliant with this, but at this point, his sodium has remained  stable.  I have recommended that he stop his diuretics indefinitely.   He has a history of anemia, which continued during the hospitalization,  but has been stable.  He will need followup with Dr. Gerri Spore for  check on his sodium, hemoglobin, blood pressure, and any other medical  issues.   TOTAL TIME ON THE DAY OF DISCHARGE:  45 minutes.      Corinna L. Lendell Caprice, MD  Electronically Signed     CLS/MEDQ  D:  12/08/2007  T:  12/09/2007  Job:  [id number]   cc:   Otilio Connors. Gerri Spore, M.D.  Wilmon Arms. Tsuei, M.D.

## 2010-08-21 NOTE — H&P (Signed)
NAMECHRISTHOPER, BUSBEE                  ACCOUNT NO.:  [id number]   MEDICAL RECORD NO.:  [id number]          PATIENT TYPE:  INP   LOCATION:  5038                         FACILITY:  [facility]   PHYSICIAN:  Hollice Espy, M.D.DATE OF BIRTH:  29-Oct-1950   DATE OF ADMISSION:  11/11/2007  DATE OF DISCHARGE:                              HISTORY & PHYSICAL   PRIMARY CARE PHYSICIAN:  Dr. Otilio Connors. Westermann.   CONSULTANTS:  [facility], Dr. Jamey Ripa.   CHIEF COMPLAINT:  Sacral ulcer.   HISTORY OF PRESENT ILLNESS:  The patient is a 60 year old white male  with past medical history of myelopathy, who had previously been having  problems with severe lower back pain, declining mobility and progressive  lower extremity weakness since December 2008.  He had been referred to  be [facility] in [place] for a CT myelogram on  September 09, 2007, when he developed bilateral lower extremity weakness,  postprocedure stenosis, cord atrophy and arachnoiditis and ended up  having a spinal cord injury with a T11 tear and T12 myelopathy.  Since  that time, he has been paralyzed and unable to walk.  He was transferred  to [facility], where he spent a month in rehabilitation.  During  that time near the end of his rehabilitation stay, the patient had a  tear on his lower back from an extended hook near his bed when moving,  which caught and caused him to have some superficial laceration.  This  wound worsened and progressed to the point where now he has a severe  stage IV decubitus ulcer, which is several inches wide any even longer  across.  The patient was evaluated by Dr. Butler Denmark, Dr. Jamey Ripa  in the office 1 day prior to admission, who recommended the patient to  come in for a surgical debridement.  At their request, the patient was  put on the medicine service.  Upon arrival to the floor, the patient was  seen and labs were ordered.  He currently is doing okay.  He  complains  of his chronic pain, but otherwise is doing well.  He denies any  headaches, vision changes or dysphagia.  No chest pain, palpitations, no  shortness of breath.  No wheezing or coughing.  No abdominal pain.  No  hematuria or dysuria.  He regularly straight caths himself with a Foley.  He denies any changes in focal extremity numbness, weakness or pain  other than of course his bilateral lower extremity paraplegia.  His  review of systems are otherwise negative.   PAST MEDICAL HISTORY:  1. An incomplete spinal cord injury with T11 tear, T12 myelopathy and      secondary paralysis, which has occurred in the last few months.  2. Hypertension.  3. Worsening wound, now is a large stage IV decubitus ulcer.  4. Chronic pain issues.  5. Neurogenic bowel and bladder.  6. History of blood loss anemia.  7. Obesity.  8. Hypertension.  9. GERD.  10.History of CAD, status post stent.   MEDICATIONS:  1.  Multivitamin daily.  2. B6 daily.  3. Senokot 2 tablets daily.  4. Soma 350 three tablets p.o. q.6 h.  5. Lyrica 75 p.o. t.i.d.  6. Prednisone 20 p.o. t.i.d.  7. Compazine 1 p.o. daily p.r.n.  8. Zyrtec 10 p.o. daily p.r.n.  9. Lasix 20 p.o. daily.  10.Rhinocort p.o. daily.  11.Albuterol MDI 2 puffs four times a day p.r.n.  12.Nitro-Dur 0.4 mg sublingual p.r.n.  13.K-Dur 20 mEq p.o. daily p.r.n.  14.Aspirin 325 p.o. daily.  15.Iron 325 p.o. t.i.d.  16.Nexium 40.  17.Carafate 1 gm p.o. daily.  18.Plavix 75 p.o. daily.  The patient last took it on November 10, 2007.  19.Triamterene/hydrochlorothiazide 75/50 p.o. daily.  20.Benazepril 40 p.o. daily.  21.Avapro 300 p.o. daily.  22.Valium p.o. nightly.  23.Percocet 10/325 two tabs p.o. nightly.  24.Terazosin 4 mg p.o. nightly.  25.Cardizem 180 p.o. daily.  26.Norvasc 5 p.o. daily.  27.Tylenol #4 three tablets p.o. q.6 h.   ALLERGIES:  HE IS ALLERGIC TO DARVOCET, DECADRON AND CIPRO, ALTHOUGH  THERE IS A QUESTION OF WHAT EXACTLY  IN TERMS OF DECADRON HE CAN AND  CANNOT TAKE.  HE ALSO REPORTS INTOLERANCE TO CATAPRES AND LIPITOR.   SOCIAL HISTORY:  He used to smoke half pack per day, but has quit.  No  alcohol use.   FAMILY HISTORY:  Noncontributory.   PHYSICAL EXAMINATION:  VITAL SIGNS:  On admission, temperature 97.3,  heart rate 67, blood pressure 132/69, respirations 20, O2 sat 94% on  room air.  GENERAL:  He is alert and oriented x3.  No apparent distress.  HEENT:  Normocephalic, atraumatic.  Mucous membranes are moist.  NECK:  He has no carotid bruits.  HEART:  Regular rate and rhythm.  S1-S2.  LUNGS:  Clear to auscultation bilaterally.  Decreased breath sounds  secondary to body habitus.  ABDOMEN:  Soft, obese and nontender.  Positive bowel sounds.  BACK:  His back side, I able to see a limited picture, but he has a  large stage IV decub ulcer which is about 3 inches vertically and across  the majority of his backside horizontally.  Limited my view because of  him being bedridden.  EXTREMITIES:  Show no clubbing or cyanosis.  Trace pitting edema.  He is  paralyzed from the waist down.   LABORATORY DATA:  Sodium 132, potassium 4.5, chloride 97, bicarb 28, BUN  25, creatinine 0.5, glucose 126.  White count 8.1, hemoglobin and  hematocrit 10.3 and 31, MCV of 85, platelet count 164.  I have ordered  coags, they are pending.   ASSESSMENT/PLAN:  1. Stage IV large sacral decubitus ulcer.  [facility] to      see.  They are debating whether or not they will debride him in the      operating room tonight or tomorrow.  His Plavix has been on hold      and this may be the deciding factor.  In the meantime, he is n.p.o.  2. 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  Elevate his feet.  He has PAS      hose.  Continue medications for pain.  3. Hypertension and coronary artery disease.  Continue p.o. meds.      Hollice Espy, M.D.  Electronically Signed     SKK/MEDQ  D:  11/11/2007  T:   11/11/2007  Job:  [id number]   cc:   Otilio Connors. Gerri Spore, M.D.  Wilmon Arms. Tsuei, M.D.  Currie Paris, M.D.

## 2010-08-21 NOTE — Consult Note (Signed)
NAMESEVEN, DOLLENS NO.:  [id number]   MEDICAL RECORD NO.:  [id number]          PATIENT TYPE:  INP   LOCATION:  5038                         FACILITY:  [facility]   PHYSICIAN:  Antonietta Breach, M.D.  DATE OF BIRTH:  [birth year]   DATE OF CONSULTATION:  11/27/2007  DATE OF DISCHARGE:                                 CONSULTATION   REQUESTING PHYSICIAN:  [facility].   REASON FOR CONSULTATION:  Rule out depression.   HISTORY OF PRESENT ILLNESS:  Mr. [name] is a 60 year old male who  has suffered catastrophic medical conditions.  Please see the discussion  below.  Mr. [name] has been experiencing some reduction in energy and  decreased concentration.  He has had some transient depressed mood which  is now resolved.  He maintains interest in TV as well as fishing.  He  has already discussed with his family how provisions will be made to  help him that fish and also be able to target shoot.  He does not have  any thoughts of harming himself or others.  He has no hallucinations or  delusions.   His memory function is intact.  His orientation function is intact.  He  has been experiencing decreased energy and mild decreased concentration  for approximately 3 weeks.   PAST PSYCHIATRIC HISTORY:  In review of the past medical record, Mr.  [name] did have a period of delirium in October of 2008.  He does recall  that he had a period of confusion then that was secondary to medication.  He denies any other psychiatric or mental status symptoms in the past.   FAMILY PSYCHIATRIC HISTORY:  None known.   SOCIAL HISTORY:  Mr. [name] is originally from [place].  He grew up in  [place] and spent some time also [place] of [place] after he was  married.  He states that that is the closest that his wife was willing  to go back towards [place].   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  He has no children.  He has rare use of  alcohol.  He  originally injured his back while cutting down trees for the [facility] and  has gone on to have progressive back and myelopathy problems.  Please  see below.  He does not use alcohol or illegal drugs.  He has strong  support from his religion and church.   PAST MEDICAL HISTORY:  Myelopathy, coronary artery disease with a stent,  severe low back pain, gastroesophageal reflux disease, obesity,  neurogenic bowel and bladder, severe decubitus ulcer, hypertension.   Mr. [name] suffered from a T11 tear on top of T12 myelopathy and over the  past few months has progressed to paralysis.   MEDICATIONS:  The MAR is reviewed.  Notable medications include Valium  10 mg q.h.s., multivitamin daily, prednisone 20 mg t.i.d., B6 100 mg  daily.   MR. [name] IS ALLERGIC TO PROPOXYPHENE NAPSYLATE, DECADRON,  CIPROFLOXACIN, LIPITOR, KLONOPIN, AND __________.   LABORATORY DATA:  Sodium 117, BUN 14, creatinine 0.43, glucose 143, WBC  10.4, hemoglobin 7.9, platelet count 187.  TSH within normal limits.  SGOT 22, SGPT 38.   REVIEW OF SYSTEMS:  Constitutional, head, eyes, ears, nose, throat,  mouth, neurologic, psychiatric, cardiovascular, respiratory,  gastrointestinal, genitourinary, skin, musculoskeletal, hematologic,  lymphatic, endocrine, metabolic all unremarkable.   EXAMINATION:  VITAL SIGNS:  Temperature 96.9, pulse 60, respiratory rate  20, blood pressure 149/72, O2 saturation on room air 96%.  GENERAL APPEARANCE:  Mr. [name] is a middle-aged male lying in a supine  position in his hospital bed with no abnormal involuntary movements.   MENTAL STATUS EXAM:  Mr. [name] is alert.  His eye contact is good.  His  concentration is mildly decreased.  His mood is within normal limits.  His affect is mildly flat at baseline but he does have some appropriate  smiling as the conversation proceeds.  He has intact memory to immediate  recent and remote.  He is oriented to all spheres.  His fund of   knowledge and intelligence are within normal limits.  Speech involves  normal rate and prosody without dysarthria.  Thought process logical,  coherent, goal-directed.  No looseness of associations.  Thought  content:  No thoughts of harming himself.  No thoughts of harming  others.  No delusions.  No hallucinations.  Insight is intact.  Judgment  is intact.   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  (Mr. [name] does appear to have some decreased energy and concentration  that are secondary to his general medical condition that required  treatment.  He may have also had some short-lived reactive depressive  symptoms which have now resolved.)  AXIS II:  None.  AXIS III:  See past medical history.  AXIS IV:  General medical.  AXIS V:  55.   Mr. [name] is not at risk to harm himself or others.  He agrees to call  emergency services immediately for any thoughts of harming himself,  thoughts of harming others or other psychiatric emergency symptoms.   The undersigned provided ego supportive psychotherapy and education.   RECOMMENDATIONS:  Mr. [name] does not require psychotropic medication at  this time.   If decreased energy continues and is compromising his ability to proceed  through a course of recovery medically, would reconsult psychiatry.   Also would reconsult psychiatry if Mr. [name] develops additional  symptoms that would be consistent with major depression.      Antonietta Breach, M.D.  Electronically Signed     JW/MEDQ  D:  11/28/2007  T:  11/28/2007  Job:  [id number]

## 2010-08-21 NOTE — Consult Note (Signed)
NAMEILAN, KAHRS                  ACCOUNT NO.:  [id number]   MEDICAL RECORD NO.:  [id number]          PATIENT TYPE:  INP   LOCATION:  5038                         FACILITY:  [facility]   PHYSICIAN:  Wilmon Arms. Corliss Skains, M.D. DATE OF BIRTH:  December 21, 1950   DATE OF CONSULTATION:  11/11/2007  DATE OF DISCHARGE:                                 CONSULTATION   REQUESTING PHYSICIAN:  Dr. Rito Ehrlich with [facility] hospitalist.   CONSULTING SURGEON:  Wilmon Arms. Tsuei, MD.   TIME OF CONSULTATION:  1620 hours.   REASON FOR CONSULTATION:  Left buttock unstageable decubitus ulcer.   HISTORY OF PRESENT ILLNESS:  Mr. Wingard is a 60 year old white male with  a history of hypertension, hyperlipidemia, and multiple back surgeries.  His most recent back surgery was in June 2009.  This surgery was  performed in [place], [place] where his orthopedic surgeon is  and later transferred up here to [facility] in the rehab unit.  While in  the rehabilitation unit, the patient developed a left buttock decubitus  ulcer.  At the time of discharge, the patient was sent home with Santyl  cream, which the wife changed daily.  Over the next several weeks, this  area began to improve and last week the wife states that this area was  approximately pea-to-nickel size with black eschar, but improving.  However, since last week, this improvement has made a dramatic turn and  has progressively worsened to approximately an 8 x 12 large size eschar  covered left buttock decubitus ulcer now.  Because of this, the patient  went to see Dr. Jamey Ripa at [facility] in the regional  office yesterday and at that time, he felt that this needed to be  debrided.  Therefore, at this time he allowed the patient to return home  as his wish and be admitted today.  At the time of admission, [facility]  hospitalist admitted the patient and we were consulted for debridement  of this ulcer.   REVIEW OF SYSTEMS:  1. The patient is an  incomplete paraplegic due to a myelogram that was      done several years ago.  However, he does state that slowly he is      beginning to get some sensation back.  2. Chronic Foley usage.  3. Chronic back pain.  4. Chronic seasonal allergies.  Otherwise, the patient does not      complain of any chest pain or shortness of breath and see HPI for      further details.  Otherwise, all other systems are negative.   FAMILY HISTORY:  Noncontributory.   PAST MEDICAL HISTORY:  Significant for:  1. Chronic back pain resulting in multiple surgeries.  2. Chronic allergies.  3. Incomplete paraplegia.  4. Hypertension.  5. GERD.   PAST SURGICAL HISTORY:  1. Status post 7 exploratory laparotomies and 4 repeat hernia repairs.  2. Status post appendectomy.  3. Status post cholecystectomy.  4. Status post 12 back surgeries.  5. Status post left breast lumpectomy.  6. Vein stripping.  7. Status post  rotator cuff repair of the right shoulder.   SOCIAL HISTORY:  The patient has been married for 36 years.  He does not  have any children.  He is currently retired and on disability from [employer] of the [employer].  Otherwise, the patient admits to  smoking approximately 1-2 cigars a day.  Very rarely does he ever drink  due to the multiple number of medications that he is on.   ALLERGIES:  1. DECADRON, which has an anaphylactic reaction.  2. METHYLPREDNISOLONE.  3. DARVOCET.  4. LIPITOR.  5. CIPRO.  6. CLONIDINE.  7. __________.   MEDICATIONS:  1. Nexium 40 mg daily.  2. Carafate daily.  3. Plavix 75 mg daily.  4. Triamterene/hydrochlorothiazide 75/50 daily.  5. Benazepril hydrochloride 40 mg daily.  6. Avapro 300 mg daily.  7. Valium 10 mg daily.  8. Percocet 10/325 two tablets daily.  9. Terazosin 2 mg two tablets daily.  10.Diltiazem 180 mg daily.  11.Amlodipine 5 mg daily.  12.Tylenol No. 4 three tablets q.6 h.  13.Soma 350 mg 3 tablets q.6 h.  14.Lyrica 75 mg 3  times a day.  15.Prednisone 20 mg 3 times a day.  16.Compazine 1 mg p.r.n.  17.Zyrtec p.r.n.  18.Lasix p.r.n.  19.Rhinocort p.r.n.  20.Albuterol p.r.n.  21.Nitroglycerin p.r.n.  22.Potassium p.r.n.  23.Aspirin 325 mg daily.  24.Iron tablet 3 times a day.  25.Men's daily vitamin daily.  26.B6 daily.  27.Senokot-S 2 tablets daily at 2 p.m.  28.Apparently, recently, the patient has also been taking Augmentin      unsure of the dose.  29.Lovenox.  I would assume 40 mg subcu daily that is not on the      sheet; however, that is reported by the patient and his wife.   PHYSICAL EXAMINATION:  GENERAL:  This is a very pleasant well-developed  and well-nourished 60 year old white male, who is lying in bed,  currently in no acute distress.  VITAL SIGNS:  Temperature 97.3, pulse 67, respirations 20, and blood  pressure 132/69.  EYES:  Sclerae noninjected.  Pupils are equal, round, and reactive to  light.  EARS, NOSE, MOUTH, AND THROAT:  Ears and nose, no obvious masses or  lesions.  No rhinorrhea.  Mouth is pink and moist.  Throat shows no  exudate.  NECK:  Supple.  Trachea is midline.  No thyromegaly.  HEART:  Regular rate and rhythm.  Normal S1 and S2.  No murmurs,  gallops, or rubs are noted, +2 carotid and pedal and radial pulses  bilaterally.  LUNGS:  Slight wheezes bilaterally with anterior auscultation.  Otherwise, they are clear to auscultation bilaterally with no other  rales or rhonchi.  Respiratory effort is nonlabored.  CHEST:  Symmetrical.  ABDOMEN:  Soft, nontender, and nondistended, but obese with active bowel  sounds.  He has multiple ecchymoses from his Lovenox injections.  He  does have a large midline incision from a prior multiple exploratory  laparotomies and Gore-Tex mesh is able to be felt underneath the skin.  MUSCULOSKELETAL:  All 4 extremities are symmetrical.  The patient does  have some edema in his bilateral lower extremities.  Otherwise, he does  not have  any cyanosis or clubbing.  SKIN:  He has a large 8 x 12 left buttock unstageable decubitus ulcer.  This is 100% eschar with some surrounding erythema in the still living  tissue.  The eschar is beginning to track upwards at the 12 o'clock  position.  Otherwise, there is no overwhelmingly obvious odor.  This  area does not feel indurated, but it does feel fluctuant.  Also of note  on the gauze,  this area is turning the gauze blue, which may indicate  that this is probably a Pseudomonal infection.  NEUROLOGIC:  Cranial nerves II through XII appear to be grossly intact.  Deep tendon reflex exam is deferred at this time.  However, of note, the  patient is an incomplete paraplegic and uses a wheelchair to get around.  PSYCH:  The patient is alert and oriented x3 with an appropriate affect.   LABORATORY DATA AND DIAGNOSTICS:  Sodium 132, potassium 4.5, glucose  126, BUN 25, and creatinine 0.54.  WBCs 8100, hemoglobin is 10.3,  hematocrit 30.8, and platelet count is 164,000.  No diagnostics have  been done.   IMPRESSION:  1. Left buttock unstageable decubitus ulcer.  2. Incomplete paraplegia.  3. Hypertension.  4. Gastroesophageal reflux disease.  5. Chronic allergies.  6. Chronic back pain.   PLAN:  At this time, we will allow the patient to have a regular diet  tonight; however, after midnight he needs to be kept NPO for surgical  debridement tomorrow in the OR this area.  Otherwise, we will go ahead  and start the patient on a broad-spectrum antibiotic such as Zosyn 3.375  mg IV q.8 h.  Otherwise, the patient held his Plavix yesterday.  However, due to the length that it would take for this to be completely  reversed is so significant that allowing the patient to continue this  should not make that large of a deal; therefore, we will allow the  patient to continue taking his Plavix.  However, if the patient does  have Lovenox order, we will hold that tomorrow morning prior to OR.   Otherwise, at this time Dr. Corliss Skains has examined the patient with me and  gone over the procedure as well as risk and benefits with the patient  and his wife, and at this time they wish to proceed with surgical  intervention tomorrow morning.      Letha Cape, PA      Wilmon Arms. Tsuei, M.D.  Electronically Signed    KEO/MEDQ  D:  11/11/2007  T:  11/12/2007  Job:  [id number]   cc:   Dr. Rito Ehrlich

## 2010-08-21 NOTE — Discharge Summary (Signed)
[name], [name]                  ACCOUNT NO.:  [id number]   MEDICAL RECORD NO.:  [id number]          PATIENT TYPE:  INP   LOCATION:  1616                         [facility]AC[facility]   PHYSICIAN:  Corinna L. Lendell Caprice, MDDATE OF BIRTH:  Feb 18, 1951   DATE OF ADMISSION:  02/09/2008  DATE OF DISCHARGE:  02/10/2008                               DISCHARGE SUMMARY   DISCHARGE DIAGNOSES:  1. Resolved vomiting and diarrhea.  2. Resolved hypotension.  3. Bacteriuria, suspect urinary tract infection.  4. Chronic indwelling Foley catheter.  5. Stage IV sacral decubitus of the left buttock.  6. Morbid obesity.  7. Chronic peripheral edema.  8. Hypertension.  9. T12 incomplete spinal cord injury.  10.Neurogenic bowel and bladder, chronic Foley catheter.  11.Chronic anemia.  12.Chronic sinusitis.  13.Coronary artery disease with stent.  14.History of hyponatremia.  15.Gastroesophageal reflux disease.   DISCHARGE MEDICATIONS:  Stop Augmentin and instead take Bactrim Double  Strength 1 p.o. b.i.d. for another week.  He is to hold benazepril  Avapro, terazosin, Cartia XT, amlodipine until systolic blood pressure  above 130 or as per Dr. Clyde Canterbury.  He may continue Some, Tylenol #4,  diazepam, Nexium, sucralfate, Imodium, Compazine, hydrocodone/Tylenol,  Zyrtec, Lasix and prednisone as previous.   Please see H and P, medicine reconciliation form reviewed.   CONDITION:  Stable.   CONSULTATIONS:  None.   Follow up with Dr. Harlon Flor when able to reschedule for his wound check.  Follow up with Dr. Clyde Canterbury within a week to check blood pressure.   DIET:  Should be low-salt.   ACTIVITY:  Decubitus precautions.  Continue home physical therapy,  occupational therapy, wound VAC to the buttock.   LABS:  White count on admission 17,000, at discharge is 14,000.  Hemoglobin on admission 11.3, at discharge 9.6.  ABG on 2 liters nasal  cannula showed a pH of 7.332, PCO2 of 51, PO2 68, bicarbonate 26,  oxygen  saturation 90%.  Complete metabolic panel significant for a BUN of 33,  total protein of 5.7, albumin 2.9, acetaminophen level less than 10.  Urine drug screen positive for benzos and opiates.  Urinalysis showed  small bilirubin, trace ketones, moderate blood, 100 protein, negative  nitrite, large leukocyte esterase, calcium oxalate crystals, 21-50 white  cells, many bacteria.  Urine culture is pending.   SPECIAL STUDIES/RADIOLOGY:  1. CT of the brain showed nothing acute, motion degradation.  2. Chest x-ray showed cardiomegaly and pulmonary vascular congestion.  3. EKG showed normal sinus rhythm.   HISTORY AND HOSPITAL COURSE:  Mr. Moss is a 60 year old white male with  multiple medical problems who presented with weakness, altered mental  status, vomiting and diarrhea.  Please see H and P for complete  admission details.  He was admitted to the floor where his mental status  improved.  He had no further vomiting or diarrhea.  His blood pressure  stabilized. At the time of discharge his systolic blood pressure is 100  off all his antihypertensives.  His Lasix was continued as he did have  3+ pitting edema.  He was started  on vancomycin and cefepime. I suspect  he has chronic bacteriuria secondary to the chronic indwelling Foley  catheter and neurogenic bladder.  He did improve very quickly and so it  is questionable as to whether this is colonization or in fact a urinary  tract infection.  He had been on Augmentin for about a month for  recurrent prostatitis, I have switched him to Bactrim for a week to err  on the side of caution.  His wife reports that he has been tapering off  prednisone due to swelling.  His hypotension and other symptoms may  simply be secondary to secondary adrenal insufficiency and I recommended  that they monitor his blood pressure and hold his antihypertensives  until his blood pressure comes back up.  His other medical problems  remained stable.  please see H and P.  Total time on the day of discharge  is 45 minutes.      Corinna L. Lendell Caprice, MD  Electronically Signed     CLS/MEDQ  D:  02/10/2008  T:  02/10/2008  Job:  [id number]   cc:   Otilio Connors. Gerri Spore, M.D.  Fax: [phone]   Wilmon Arms. Corliss Skains, M.D.  [address] [place] [address] [address]  [place]

## 2010-08-24 NOTE — Cardiovascular Report (Signed)
NAMEREQUAN, HARDGE                  ACCOUNT NO.:  [id number]   MEDICAL RECORD NO.:  [id number]          PATIENT TYPE:  OIB   LOCATION:  6523                         FACILITY:  [facility]   PHYSICIAN:  Lyn Records III, M.D.DATE OF BIRTH:  1950/07/26   DATE OF PROCEDURE:  07/09/2004  DATE OF DISCHARGE:                              CARDIAC CATHETERIZATION   INDICATIONS:  Class IV angina, abnormal Cardiolite, high-grade ostial first  obtuse marginal.   REFERRING PHYSICIAN:  Carola J. Gerri Spore, M.D., at [facility].   PROCEDURES PERFORMED:  1.  Angioplasty of the distal obtuse marginal II.  2.  Angioplasty and CYPHER drug-eluting stent implantation in the proximal      portion of ostial obtuse marginal II.  3.  AngioSeal arteriotomy closure.   DESCRIPTION:  I came into this case after being called by Dr. Mayford Knife. She  had demonstrated high-grade first obtuse marginal disease near the ostium  from the circumflex and also in the distal vessel. After reviewing the  cineangiograms, we felt that the distal right coronary and a borderline  lesion and that it was most likely that symptoms were coming from the  patient's second obtuse marginal. After discussing the nature of PCI and  stenting with the patient and his wife, we decided to proceed. He did  understand the risk of bleeding, surgery, myocardial infarction, among other  complications.   We used a CLS #4 7-French guide catheter and a BMW wire to cross the lesion.  We predilated distally with a 15 mm long x 2.0 mm diameter balloon and then  predilated with this balloon in the proximal vessel as well. We then stented  using a 23 x 2.5 mm diameter CYPHER balloon. We then post dilated the  proximal two thirds of this stent with a 2.75 mm diameter Quantum 15 mm long  balloon. The follow-up angiographic results was felt to be quite nice. TIMI  grade 3 flow was noted. No complications occurred. AngioSeal arteriotomy  closure was performed. Minimal oozing was noted post procedure.   The patient received weight-based heparin and double bolus followed by an  infusion of Integrilin. There was 300 mg of oral Plavix given during the  procedure.   CONCLUSION:  1.  Successful angioplasty as outlined above with reduction and proximal OM      II stenosis from greater than 90% to 0%.  2.  Successful angioplasty of the distal OM II from 90% to less than 50%.  3.  Successful Angio-Seal.   PLAN:  Aspirin and Plavix. Plavix should be continued least six months.  Further management per Dr. Mayford Knife      HWS/MEDQ  D:  07/09/2004  T:  07/09/2004  Job:  [id number]   cc:   Otilio Connors. Gerri Spore, M.D.  [address]  [place]  [place] [address]  Fax: [phone]

## 2010-10-04 ENCOUNTER — Encounter (HOSPITAL_BASED_OUTPATIENT_CLINIC_OR_DEPARTMENT_OTHER): Payer: 59 | Attending: Internal Medicine

## 2010-10-04 DIAGNOSIS — L8993 Pressure ulcer of unspecified site, stage 3: Secondary | ICD-10-CM | POA: Insufficient documentation

## 2010-10-04 DIAGNOSIS — L8990 Pressure ulcer of unspecified site, unspecified stage: Secondary | ICD-10-CM | POA: Insufficient documentation

## 2010-10-04 DIAGNOSIS — L89609 Pressure ulcer of unspecified heel, unspecified stage: Secondary | ICD-10-CM | POA: Insufficient documentation

## 2010-10-04 DIAGNOSIS — L89899 Pressure ulcer of other site, unspecified stage: Secondary | ICD-10-CM | POA: Insufficient documentation

## 2010-11-01 ENCOUNTER — Encounter (HOSPITAL_BASED_OUTPATIENT_CLINIC_OR_DEPARTMENT_OTHER): Payer: 59 | Attending: Internal Medicine

## 2010-11-01 DIAGNOSIS — L89609 Pressure ulcer of unspecified heel, unspecified stage: Secondary | ICD-10-CM | POA: Insufficient documentation

## 2010-11-01 DIAGNOSIS — L89899 Pressure ulcer of other site, unspecified stage: Secondary | ICD-10-CM | POA: Insufficient documentation

## 2010-11-01 DIAGNOSIS — L8990 Pressure ulcer of unspecified site, unspecified stage: Secondary | ICD-10-CM | POA: Insufficient documentation

## 2010-11-01 DIAGNOSIS — L8993 Pressure ulcer of unspecified site, stage 3: Secondary | ICD-10-CM | POA: Insufficient documentation

## 2011-01-02 ENCOUNTER — Encounter (HOSPITAL_BASED_OUTPATIENT_CLINIC_OR_DEPARTMENT_OTHER): Payer: 59

## 2011-01-03 LAB — URINE MICROSCOPIC-ADD ON

## 2011-01-03 LAB — DIFFERENTIAL
Basophils Absolute: 0
Basophils Absolute: 0
Basophils Absolute: 0
Basophils Relative: 0
Basophils Relative: 0
Eosinophils Absolute: 0
Eosinophils Absolute: 0.1
Eosinophils Relative: 0
Lymphocytes Relative: 15
Lymphocytes Relative: 12
Lymphs Abs: 1.3
Monocytes Absolute: 0.9
Monocytes Absolute: 0.4
Monocytes Relative: 5
Neutro Abs: 16.1 — ABNORMAL HIGH
Neutro Abs: 3.2
Neutrophils Relative %: 82 — ABNORMAL HIGH

## 2011-01-03 LAB — WOUND CULTURE

## 2011-01-03 LAB — BASIC METABOLIC PANEL
CO2: 31
CO2: 28
CO2: 29
Calcium: 8.7
Calcium: 8.4
Chloride: 95 — ABNORMAL LOW
Creatinine, Ser: 0.66
Creatinine, Ser: 0.58
GFR calc Af Amer: >60
GFR calc Af Amer: >60
GFR calc non Af Amer: >60
Glucose, Bld: 106 — ABNORMAL HIGH
Sodium: 131 — ABNORMAL LOW

## 2011-01-03 LAB — CROSSMATCH

## 2011-01-03 LAB — COMPREHENSIVE METABOLIC PANEL
ALT: 74 — ABNORMAL HIGH
AST: 30
AST: 74 — ABNORMAL HIGH
Albumin: 2.7 — ABNORMAL LOW
Alkaline Phosphatase: 100
BUN: 40 — ABNORMAL HIGH
BUN: 15
CO2: 28
CO2: 28
Calcium: 8.4
Chloride: 99
Chloride: 93 — ABNORMAL LOW
Creatinine, Ser: 0.67
GFR calc Af Amer: 53 — ABNORMAL LOW
GFR calc Af Amer: >60
GFR calc non Af Amer: >60
GFR calc non Af Amer: >60
Glucose, Bld: 123 — ABNORMAL HIGH
Potassium: 4.6
Sodium: 135
Total Bilirubin: 0.2 — ABNORMAL LOW
Total Bilirubin: 0.6
Total Protein: 5.3 — ABNORMAL LOW

## 2011-01-03 LAB — CBC
HCT: 26.7 — ABNORMAL LOW
HCT: 24.6 — ABNORMAL LOW
HCT: 26.8 — ABNORMAL LOW
HCT: 28.2 — ABNORMAL LOW
Hemoglobin: 8.3 — ABNORMAL LOW
Hemoglobin: 9 — ABNORMAL LOW
Hemoglobin: 9.5 — ABNORMAL LOW
Hemoglobin: 9.9 — ABNORMAL LOW
Hemoglobin: 9.9 — ABNORMAL LOW
MCHC: 34.1
MCHC: 33.4
MCHC: 33.6
MCV: 85.4
Platelets: 227
Platelets: 244
Platelets: 108 — ABNORMAL LOW
Platelets: 123 — ABNORMAL LOW
RBC: 3.25 — ABNORMAL LOW
RBC: 3.38 — ABNORMAL LOW
RBC: 3.14 — ABNORMAL LOW
RDW: 17.4 — ABNORMAL HIGH
RDW: 17.3 — ABNORMAL HIGH
RDW: 17.7 — ABNORMAL HIGH
WBC: 16.2 — ABNORMAL HIGH
WBC: 4.3
WBC: 5.2

## 2011-01-03 LAB — ABO/RH: ABO/RH(D): A POS

## 2011-01-03 LAB — URINALYSIS, ROUTINE W REFLEX MICROSCOPIC
Glucose, UA: NEGATIVE
Glucose, UA: NEGATIVE
Nitrite: NEGATIVE
Nitrite: NEGATIVE
Protein, ur: NEGATIVE
Protein, ur: 30 — AB
Urobilinogen, UA: 1
pH: 7

## 2011-01-03 LAB — URINE CULTURE
Colony Count: NO GROWTH
Culture: NO GROWTH
Culture: NO GROWTH

## 2011-01-04 LAB — CBC
HCT: 23.8 — ABNORMAL LOW
HCT: 30.8 — ABNORMAL LOW
Hemoglobin: 10.3 — ABNORMAL LOW
Hemoglobin: 8.3 — ABNORMAL LOW
MCHC: 33.4
MCHC: 33.5
MCV: 85.1
Platelets: 277
RDW: 16.3 — ABNORMAL HIGH
RDW: 17.1 — ABNORMAL HIGH
RDW: 17.1 — ABNORMAL HIGH
WBC: 19.5 — ABNORMAL HIGH

## 2011-01-04 LAB — COMPREHENSIVE METABOLIC PANEL
BUN: 25 — ABNORMAL HIGH
CO2: 28
Calcium: 9.2
GFR calc non Af Amer: >60
Glucose, Bld: 126 — ABNORMAL HIGH
Total Protein: 6.1

## 2011-01-04 LAB — FACTOR 5 LEIDEN

## 2011-01-04 LAB — WOUND CULTURE
Gram Stain: NONE SEEN
Gram Stain: NONE SEEN

## 2011-01-04 LAB — DIFFERENTIAL
Basophils Absolute: 0
Lymphocytes Relative: 10 — ABNORMAL LOW
Lymphs Abs: 2
Neutro Abs: 16.6 — ABNORMAL HIGH
Neutrophils Relative %: 85 — ABNORMAL HIGH

## 2011-01-04 LAB — ANAEROBIC CULTURE

## 2011-01-04 LAB — PROTIME-INR
INR: 1
Prothrombin Time: 13.7

## 2011-01-04 LAB — CARDIOLIPIN ANTIBODIES, IGG, IGM, IGA
Anticardiolipin IgA: <7 — ABNORMAL LOW (ref <13)
Anticardiolipin IgG: <7 — ABNORMAL LOW (ref <11)
Anticardiolipin IgM: 9 — ABNORMAL LOW (ref <10)

## 2011-01-04 LAB — PROTEIN S ACTIVITY: Protein S Activity: 71 % (ref 69–129)

## 2011-01-04 LAB — BETA-2-GLYCOPROTEIN I ABS, IGG/M/A: Beta-2-Glycoprotein I IgM: <4 U/mL (ref <10)

## 2011-01-04 LAB — LUPUS ANTICOAGULANT PANEL
PTTLA 4:1 Mix: 51.5 — ABNORMAL HIGH (ref 36.3–48.8)
PTTLA Confirmation: 29 — ABNORMAL HIGH (ref <8.0)

## 2011-01-04 LAB — PROTEIN C, TOTAL: Protein C, Total: 106 % (ref 70–140)

## 2011-01-04 LAB — HOMOCYSTEINE: Homocysteine: 9

## 2011-01-04 LAB — PROTHROMBIN GENE MUTATION

## 2011-01-04 LAB — PROTEIN C ACTIVITY: Protein C Activity: 193 % — ABNORMAL HIGH (ref 75–133)

## 2011-01-08 LAB — DIFFERENTIAL
Basophils Absolute: 0.5 — ABNORMAL HIGH
Basophils Relative: 3 — ABNORMAL HIGH
Eosinophils Absolute: 0.1
Monocytes Relative: 5
Neutro Abs: 14.3 — ABNORMAL HIGH
Neutrophils Relative %: 80 — ABNORMAL HIGH

## 2011-01-08 LAB — BLOOD GAS, ARTERIAL
Drawn by: 295031
O2 Content: 0.2
Patient temperature: 98.6
TCO2: 24.4
pCO2 arterial: 51.7 — ABNORMAL HIGH
pH, Arterial: 7.332 — ABNORMAL LOW

## 2011-01-08 LAB — COMPREHENSIVE METABOLIC PANEL
ALT: 26
Alkaline Phosphatase: 94
BUN: 33 — ABNORMAL HIGH
CO2: 29
GFR calc non Af Amer: 54 — ABNORMAL LOW
Glucose, Bld: 124 — ABNORMAL HIGH
Potassium: 3.9
Sodium: 133 — ABNORMAL LOW
Total Bilirubin: 0.5
Total Protein: 5.7 — ABNORMAL LOW

## 2011-01-08 LAB — CBC
HCT: 35 — ABNORMAL LOW
Hemoglobin: 11.3 — ABNORMAL LOW
Hemoglobin: 9.6 — ABNORMAL LOW
MCV: 85.8
RBC: 3.4 — ABNORMAL LOW
RBC: 4.06 — ABNORMAL LOW
RDW: 17.8 — ABNORMAL HIGH
WBC: 14.1 — ABNORMAL HIGH

## 2011-01-08 LAB — URINALYSIS, ROUTINE W REFLEX MICROSCOPIC
Nitrite: NEGATIVE
Protein, ur: 100 — AB
Specific Gravity, Urine: 1.03
Urobilinogen, UA: 1

## 2011-01-08 LAB — RAPID URINE DRUG SCREEN, HOSP PERFORMED
Barbiturates: NOT DETECTED
Benzodiazepines: POSITIVE — AB
Cocaine: NOT DETECTED

## 2011-01-08 LAB — URINE CULTURE

## 2011-01-08 LAB — BASIC METABOLIC PANEL
CO2: 29
Calcium: 8.4
Chloride: 98
Creatinine, Ser: 0.75
GFR calc Af Amer: >60
Sodium: 134 — ABNORMAL LOW

## 2011-01-08 LAB — URINE MICROSCOPIC-ADD ON

## 2011-01-08 LAB — AMYLASE: Amylase: 44

## 2011-01-08 LAB — LIPASE, BLOOD: Lipase: 12

## 2011-01-08 LAB — ACETAMINOPHEN LEVEL: Acetaminophen (Tylenol), Serum: <10 — ABNORMAL LOW

## 2011-01-16 LAB — CARDIAC PANEL(CRET KIN+CKTOT+MB+TROPI)
CK, MB: 1.4
Relative Index: INVALID
Total CK: 36

## 2011-01-16 LAB — I-STAT 8, (EC8 V) (CONVERTED LAB)
Acid-base deficit: 1
HCT: 38 — ABNORMAL LOW
Hemoglobin: 12.9 — ABNORMAL LOW
Operator id: 270111
Potassium: 4.1
Sodium: 138
TCO2: 25

## 2011-01-16 LAB — CBC
MCHC: 33.2
MCV: 80.9
RBC: 3.7 — ABNORMAL LOW
RBC: 3.99 — ABNORMAL LOW
RDW: 14.6 — ABNORMAL HIGH
WBC: 12 — ABNORMAL HIGH

## 2011-01-16 LAB — URINALYSIS, ROUTINE W REFLEX MICROSCOPIC
Glucose, UA: NEGATIVE
Hgb urine dipstick: NEGATIVE
Ketones, ur: NEGATIVE
Protein, ur: NEGATIVE
Urobilinogen, UA: 0.2

## 2011-01-16 LAB — RAPID URINE DRUG SCREEN, HOSP PERFORMED
Amphetamines: NOT DETECTED
Barbiturates: NOT DETECTED
Benzodiazepines: POSITIVE — AB
Cocaine: NOT DETECTED

## 2011-01-16 LAB — COMPREHENSIVE METABOLIC PANEL
ALT: 14
AST: 14
CO2: 27
Chloride: 101
GFR calc Af Amer: >60
GFR calc non Af Amer: >60
Sodium: 135
Total Bilirubin: 0.4

## 2011-01-16 LAB — POCT CARDIAC MARKERS
CKMB, poc: 1.1
Troponin i, poc: <0.05

## 2011-01-16 LAB — BASIC METABOLIC PANEL
CO2: 27
Calcium: 8.7
Chloride: 101
GFR calc Af Amer: >60
Potassium: 3.9
Sodium: 135

## 2011-01-16 LAB — DIFFERENTIAL
Basophils Absolute: 0
Basophils Relative: 0
Lymphocytes Relative: 12
Monocytes Absolute: 0.6
Neutro Abs: 9.7 — ABNORMAL HIGH
Neutrophils Relative %: 81 — ABNORMAL HIGH

## 2011-01-16 LAB — CK TOTAL AND CKMB (NOT AT ARMC): Total CK: 50

## 2011-01-24 ENCOUNTER — Encounter (HOSPITAL_BASED_OUTPATIENT_CLINIC_OR_DEPARTMENT_OTHER): Payer: 59 | Attending: Internal Medicine

## 2011-01-24 DIAGNOSIS — L8990 Pressure ulcer of unspecified site, unspecified stage: Secondary | ICD-10-CM | POA: Insufficient documentation

## 2011-01-24 DIAGNOSIS — L89609 Pressure ulcer of unspecified heel, unspecified stage: Secondary | ICD-10-CM | POA: Insufficient documentation

## 2011-01-24 DIAGNOSIS — L8993 Pressure ulcer of unspecified site, stage 3: Secondary | ICD-10-CM | POA: Insufficient documentation

## 2011-01-24 DIAGNOSIS — L89899 Pressure ulcer of other site, unspecified stage: Secondary | ICD-10-CM | POA: Insufficient documentation

## 2011-01-30 ENCOUNTER — Encounter (HOSPITAL_BASED_OUTPATIENT_CLINIC_OR_DEPARTMENT_OTHER): Payer: 59

## 2011-02-21 ENCOUNTER — Encounter (HOSPITAL_BASED_OUTPATIENT_CLINIC_OR_DEPARTMENT_OTHER): Payer: 59 | Attending: Internal Medicine

## 2011-02-21 DIAGNOSIS — L89609 Pressure ulcer of unspecified heel, unspecified stage: Secondary | ICD-10-CM | POA: Insufficient documentation

## 2011-02-21 DIAGNOSIS — L8990 Pressure ulcer of unspecified site, unspecified stage: Secondary | ICD-10-CM | POA: Insufficient documentation

## 2011-02-21 DIAGNOSIS — L89899 Pressure ulcer of other site, unspecified stage: Secondary | ICD-10-CM | POA: Insufficient documentation

## 2011-02-21 DIAGNOSIS — L8993 Pressure ulcer of unspecified site, stage 3: Secondary | ICD-10-CM | POA: Insufficient documentation

## 2011-03-12 ENCOUNTER — Other Ambulatory Visit: Payer: Self-pay | Admitting: Family Medicine

## 2011-03-12 DIAGNOSIS — N281 Cyst of kidney, acquired: Secondary | ICD-10-CM

## 2011-03-19 ENCOUNTER — Ambulatory Visit
Admission: RE | Admit: 2011-03-19 | Discharge: 2011-03-19 | Disposition: A | Discharge status: Home / Self Care | Payer: 59 | Source: Ambulatory Visit | Attending: Family Medicine | Admitting: Family Medicine

## 2011-03-19 DIAGNOSIS — N281 Cyst of kidney, acquired: Secondary | ICD-10-CM

## 2011-03-28 ENCOUNTER — Encounter (HOSPITAL_BASED_OUTPATIENT_CLINIC_OR_DEPARTMENT_OTHER): Payer: 59 | Attending: Internal Medicine

## 2011-03-28 DIAGNOSIS — L89899 Pressure ulcer of other site, unspecified stage: Secondary | ICD-10-CM | POA: Insufficient documentation

## 2011-03-28 DIAGNOSIS — Z8673 Personal history of transient ischemic attack (TIA), and cerebral infarction without residual deficits: Secondary | ICD-10-CM | POA: Insufficient documentation

## 2011-03-28 DIAGNOSIS — L89309 Pressure ulcer of unspecified buttock, unspecified stage: Secondary | ICD-10-CM | POA: Insufficient documentation

## 2011-03-28 DIAGNOSIS — L8990 Pressure ulcer of unspecified site, unspecified stage: Secondary | ICD-10-CM | POA: Insufficient documentation

## 2011-03-28 DIAGNOSIS — L89609 Pressure ulcer of unspecified heel, unspecified stage: Secondary | ICD-10-CM | POA: Insufficient documentation

## 2011-03-28 DIAGNOSIS — Z79899 Other long term (current) drug therapy: Secondary | ICD-10-CM | POA: Insufficient documentation

## 2011-04-25 ENCOUNTER — Encounter (HOSPITAL_BASED_OUTPATIENT_CLINIC_OR_DEPARTMENT_OTHER): Payer: 59

## 2011-06-13 ENCOUNTER — Emergency Department (HOSPITAL_COMMUNITY): Payer: 59

## 2011-06-13 ENCOUNTER — Inpatient Hospital Stay (HOSPITAL_COMMUNITY)
Admission: EM | Admit: 2011-06-13 | Discharge: 2011-06-21 | DRG: 579 | Disposition: A | Discharge status: Home Health | Payer: 59 | Attending: Internal Medicine | Admitting: Internal Medicine

## 2011-06-13 ENCOUNTER — Other Ambulatory Visit: Payer: Self-pay

## 2011-06-13 ENCOUNTER — Encounter (HOSPITAL_COMMUNITY): Payer: Self-pay | Admitting: Adult Health

## 2011-06-13 DIAGNOSIS — Z7401 Bed confinement status: Secondary | ICD-10-CM

## 2011-06-13 DIAGNOSIS — L0231 Cutaneous abscess of buttock: Secondary | ICD-10-CM

## 2011-06-13 DIAGNOSIS — M86179 Other acute osteomyelitis, unspecified ankle and foot: Secondary | ICD-10-CM

## 2011-06-13 DIAGNOSIS — I10 Essential (primary) hypertension: Secondary | ICD-10-CM

## 2011-06-13 DIAGNOSIS — L89609 Pressure ulcer of unspecified heel, unspecified stage: Secondary | ICD-10-CM | POA: Diagnosis present

## 2011-06-13 DIAGNOSIS — B961 Klebsiella pneumoniae [K. pneumoniae] as the cause of diseases classified elsewhere: Secondary | ICD-10-CM | POA: Diagnosis present

## 2011-06-13 DIAGNOSIS — G8929 Other chronic pain: Secondary | ICD-10-CM | POA: Diagnosis present

## 2011-06-13 DIAGNOSIS — D649 Anemia, unspecified: Secondary | ICD-10-CM | POA: Diagnosis present

## 2011-06-13 DIAGNOSIS — L89309 Pressure ulcer of unspecified buttock, unspecified stage: Secondary | ICD-10-CM

## 2011-06-13 DIAGNOSIS — L89109 Pressure ulcer of unspecified part of back, unspecified stage: Principal | ICD-10-CM | POA: Diagnosis present

## 2011-06-13 DIAGNOSIS — N390 Urinary tract infection, site not specified: Secondary | ICD-10-CM | POA: Diagnosis present

## 2011-06-13 DIAGNOSIS — E871 Hypo-osmolality and hyponatremia: Secondary | ICD-10-CM | POA: Diagnosis present

## 2011-06-13 DIAGNOSIS — G8220 Paraplegia, unspecified: Secondary | ICD-10-CM

## 2011-06-13 DIAGNOSIS — L8994 Pressure ulcer of unspecified site, stage 4: Secondary | ICD-10-CM | POA: Diagnosis present

## 2011-06-13 DIAGNOSIS — L03317 Cellulitis of buttock: Secondary | ICD-10-CM

## 2011-06-13 DIAGNOSIS — E46 Unspecified protein-calorie malnutrition: Secondary | ICD-10-CM | POA: Diagnosis present

## 2011-06-13 DIAGNOSIS — E669 Obesity, unspecified: Secondary | ICD-10-CM | POA: Diagnosis present

## 2011-06-13 DIAGNOSIS — I2510 Atherosclerotic heart disease of native coronary artery without angina pectoris: Secondary | ICD-10-CM | POA: Diagnosis present

## 2011-06-13 DIAGNOSIS — L89304 Pressure ulcer of unspecified buttock, stage 4: Secondary | ICD-10-CM

## 2011-06-13 HISTORY — DX: Essential (primary) hypertension: I10

## 2011-06-13 HISTORY — DX: Atherosclerotic heart disease of native coronary artery without angina pectoris: I25.10

## 2011-06-13 LAB — DIFFERENTIAL
Basophils Relative: 0 % (ref 0–1)
Eosinophils Absolute: 0.1 10*3/uL (ref 0.0–0.7)
Monocytes Absolute: 0.8 10*3/uL (ref 0.1–1.0)
Monocytes Relative: 5 % (ref 3–12)

## 2011-06-13 LAB — COMPREHENSIVE METABOLIC PANEL
Albumin: 2.1 g/dL — ABNORMAL LOW (ref 3.5–5.2)
BUN: 8 mg/dL (ref 6–23)
Calcium: 8.6 mg/dL (ref 8.4–10.5)
Creatinine, Ser: 0.59 mg/dL (ref 0.50–1.35)
Total Protein: 6.7 g/dL (ref 6.0–8.3)

## 2011-06-13 LAB — CBC
HCT: 25.7 % — ABNORMAL LOW (ref 39.0–52.0)
Hemoglobin: 7.9 g/dL — ABNORMAL LOW (ref 13.0–17.0)
MCH: 24.1 pg — ABNORMAL LOW (ref 26.0–34.0)
MCHC: 30.7 g/dL (ref 30.0–36.0)
RDW: 15.4 % (ref 11.5–15.5)

## 2011-06-13 LAB — URINALYSIS, ROUTINE W REFLEX MICROSCOPIC
Glucose, UA: NEGATIVE mg/dL
Protein, ur: 30 mg/dL — AB
Specific Gravity, Urine: 1.031 — ABNORMAL HIGH (ref 1.005–1.030)
pH: 5.5 (ref 5.0–8.0)

## 2011-06-13 LAB — LACTIC ACID, PLASMA: Lactic Acid, Venous: 1.3 mmol/L (ref 0.5–2.2)

## 2011-06-13 LAB — ABO/RH: ABO/RH(D): A POS

## 2011-06-13 LAB — URINE MICROSCOPIC-ADD ON

## 2011-06-13 MED ORDER — CARISOPRODOL 350 MG PO TABS
350.0000 mg | ORAL_TABLET | Freq: Once | ORAL | Status: AC
  Administered 2011-06-13: 350 mg via ORAL

## 2011-06-13 MED ORDER — POTASSIUM CHLORIDE IN NACL 20-0.9 MEQ/L-% IV SOLN
INTRAVENOUS | Status: DC
  Administered 2011-06-14 – 2011-06-15 (×2): via INTRAVENOUS
  Filled 2011-06-13 (×5): qty 1000

## 2011-06-13 MED ORDER — IOHEXOL 300 MG/ML  SOLN
100.0000 mL | Freq: Once | INTRAMUSCULAR | Status: AC | PRN
  Administered 2011-06-13: 100 mL via INTRAVENOUS

## 2011-06-13 MED ORDER — ONDANSETRON HCL 4 MG/2ML IJ SOLN
4.0000 mg | Freq: Four times a day (QID) | INTRAMUSCULAR | Status: DC | PRN
  Filled 2011-06-13 (×2): qty 2

## 2011-06-13 MED ORDER — CARISOPRODOL 350 MG PO TABS
700.0000 mg | ORAL_TABLET | Freq: Four times a day (QID) | ORAL | Status: DC
  Administered 2011-06-14 (×2): 700 mg via ORAL
  Filled 2011-06-13 (×2): qty 2

## 2011-06-13 MED ORDER — SODIUM CHLORIDE 0.9 % IJ SOLN: 9.0000 mL | INTRAMUSCULAR | Status: DC | PRN

## 2011-06-13 MED ORDER — CARISOPRODOL 350 MG PO TABS
350.0000 mg | ORAL_TABLET | Freq: Once | ORAL | Status: AC
  Administered 2011-06-13: 700 mg via ORAL

## 2011-06-13 MED ORDER — DIPHENHYDRAMINE HCL 50 MG/ML IJ SOLN: 12.5000 mg | Freq: Four times a day (QID) | INTRAMUSCULAR | Status: DC | PRN

## 2011-06-13 MED ORDER — HYDROMORPHONE HCL PF 1 MG/ML IJ SOLN
1.0000 mg | INTRAMUSCULAR | Status: DC | PRN
  Administered 2011-06-13 – 2011-06-15 (×8): 1 mg via INTRAVENOUS
  Filled 2011-06-13 (×8): qty 1

## 2011-06-13 MED ORDER — ONDANSETRON HCL 4 MG PO TABS
4.0000 mg | ORAL_TABLET | Freq: Four times a day (QID) | ORAL | Status: DC | PRN
  Administered 2011-06-14: 4 mg via ORAL
  Filled 2011-06-13: qty 1

## 2011-06-13 MED ORDER — OXYCODONE-ACETAMINOPHEN 10-325 MG PO TABS: 3.0000 | ORAL_TABLET | ORAL | Status: DC | PRN

## 2011-06-13 MED ORDER — HYDROMORPHONE HCL PF 1 MG/ML IJ SOLN
1.0000 mg | Freq: Once | INTRAMUSCULAR | Status: AC
  Administered 2011-06-13: 1 mg via INTRAVENOUS
  Filled 2011-06-13: qty 1

## 2011-06-13 MED ORDER — ACETAMINOPHEN-CODEINE #4 300-60 MG PO TABS: 3.0000 | ORAL_TABLET | ORAL | Status: DC | PRN

## 2011-06-13 MED ORDER — ONDANSETRON HCL 4 MG/2ML IJ SOLN
4.0000 mg | Freq: Once | INTRAMUSCULAR | Status: AC
  Administered 2011-06-13: 4 mg via INTRAVENOUS

## 2011-06-13 MED ORDER — PIPERACILLIN-TAZOBACTAM 3.375 G IVPB
3.3750 g | Freq: Three times a day (TID) | INTRAVENOUS | Status: DC
  Administered 2011-06-14 – 2011-06-21 (×24): 3.375 g via INTRAVENOUS
  Filled 2011-06-13 (×27): qty 50

## 2011-06-13 MED ORDER — SODIUM CHLORIDE 0.9 % IJ SOLN
3.0000 mL | Freq: Two times a day (BID) | INTRAMUSCULAR | Status: DC
  Administered 2011-06-14 – 2011-06-15 (×4): 3 mL via INTRAVENOUS

## 2011-06-13 MED ORDER — DIPHENHYDRAMINE HCL 12.5 MG/5ML PO ELIX: 12.5000 mg | ORAL_SOLUTION | Freq: Four times a day (QID) | ORAL | Status: DC | PRN

## 2011-06-13 MED ORDER — ONDANSETRON HCL 4 MG/2ML IJ SOLN
4.0000 mg | Freq: Four times a day (QID) | INTRAMUSCULAR | Status: DC | PRN
  Administered 2011-06-14 – 2011-06-21 (×14): 4 mg via INTRAVENOUS
  Filled 2011-06-13 (×14): qty 2

## 2011-06-13 MED ORDER — PIPERACILLIN-TAZOBACTAM 3.375 G IVPB 30 MIN
3.3750 g | INTRAVENOUS | Status: AC
  Administered 2011-06-13: 3.375 g via INTRAVENOUS
  Filled 2011-06-13: qty 50

## 2011-06-13 MED ORDER — OXYCODONE HCL 5 MG PO TABS: 10.0000 mg | ORAL_TABLET | ORAL | Status: DC | PRN

## 2011-06-13 MED ORDER — METOCLOPRAMIDE HCL 5 MG/ML IJ SOLN
10.0000 mg | Freq: Once | INTRAMUSCULAR | Status: AC
  Administered 2011-06-13: 10 mg via INTRAVENOUS

## 2011-06-13 MED ORDER — SODIUM CHLORIDE 0.9 % IV BOLUS (SEPSIS)
2000.0000 mL | Freq: Once | INTRAVENOUS | Status: AC
  Administered 2011-06-13: 1000 mL via INTRAVENOUS

## 2011-06-13 MED ORDER — CARISOPRODOL 350 MG PO TABS
350.0000 mg | ORAL_TABLET | Freq: Once | ORAL | Status: DC
  Filled 2011-06-13: qty 1
  Filled 2011-06-13: qty 2

## 2011-06-13 MED ORDER — NALOXONE HCL 0.4 MG/ML IJ SOLN: 0.4000 mg | INTRAMUSCULAR | Status: DC | PRN

## 2011-06-13 MED ORDER — ADULT MULTIVITAMIN LIQUID CH
5.0000 mL | Freq: Every day | ORAL | Status: DC
  Administered 2011-06-14 – 2011-06-21 (×8): 5 mL via ORAL
  Filled 2011-06-13 (×9): qty 5

## 2011-06-13 MED ORDER — VANCOMYCIN HCL IN DEXTROSE 1-5 GM/200ML-% IV SOLN
1000.0000 mg | INTRAVENOUS | Status: AC
  Administered 2011-06-13: 1000 mg via INTRAVENOUS
  Filled 2011-06-13: qty 200

## 2011-06-13 MED ORDER — HYDROMORPHONE 0.3 MG/ML IV SOLN
INTRAVENOUS | Status: DC
  Filled 2011-06-13 (×2): qty 25

## 2011-06-13 MED ORDER — DIAZEPAM 5 MG PO TABS
10.0000 mg | ORAL_TABLET | Freq: Four times a day (QID) | ORAL | Status: DC | PRN
  Administered 2011-06-14 – 2011-06-20 (×14): 10 mg via ORAL
  Filled 2011-06-13 (×14): qty 2

## 2011-06-13 MED ORDER — VITAMIN B-6 50 MG PO TABS
50.0000 mg | ORAL_TABLET | Freq: Every day | ORAL | Status: DC
  Administered 2011-06-14 – 2011-06-21 (×8): 50 mg via ORAL
  Filled 2011-06-13 (×9): qty 1

## 2011-06-13 MED ORDER — FERROUS SULFATE 325 (65 FE) MG PO TABS
650.0000 mg | ORAL_TABLET | Freq: Every day | ORAL | Status: DC
  Administered 2011-06-14 – 2011-06-21 (×8): 650 mg via ORAL
  Filled 2011-06-13 (×9): qty 2

## 2011-06-13 MED ORDER — ONDANSETRON HCL 4 MG/2ML IJ SOLN
4.0000 mg | Freq: Once | INTRAMUSCULAR | Status: AC
  Administered 2011-06-13: 4 mg via INTRAVENOUS
  Filled 2011-06-13: qty 2

## 2011-06-13 MED ORDER — FLUTICASONE PROPIONATE 50 MCG/ACT NA SUSP
2.0000 | Freq: Every day | NASAL | Status: DC
  Administered 2011-06-14 – 2011-06-21 (×8): 2 via NASAL
  Filled 2011-06-13: qty 16

## 2011-06-13 MED ORDER — ALBUTEROL SULFATE HFA 108 (90 BASE) MCG/ACT IN AERS
2.0000 | INHALATION_SPRAY | Freq: Four times a day (QID) | RESPIRATORY_TRACT | Status: DC | PRN
  Filled 2011-06-13: qty 6.7

## 2011-06-13 MED ORDER — OXYCODONE-ACETAMINOPHEN 5-325 MG PO TABS
1.0000 | ORAL_TABLET | ORAL | Status: DC | PRN
  Administered 2011-06-14 (×2): 1 via ORAL
  Filled 2011-06-13 (×2): qty 1

## 2011-06-13 MED ORDER — ACETAMINOPHEN-CODEINE #4 300-60 MG PO TABS
1.0000 | ORAL_TABLET | ORAL | Status: DC | PRN
  Filled 2011-06-13: qty 1

## 2011-06-13 MED ORDER — SODIUM CHLORIDE 0.9 % IV SOLN: INTRAVENOUS | Status: DC

## 2011-06-13 MED ORDER — ACETAMINOPHEN-CODEINE #4 300-60 MG PO TABS
2.0000 | ORAL_TABLET | ORAL | Status: DC | PRN
  Administered 2011-06-13: 1 via ORAL

## 2011-06-13 MED ORDER — VANCOMYCIN HCL 1000 MG IV SOLR
1500.0000 mg | Freq: Two times a day (BID) | INTRAVENOUS | Status: DC
  Administered 2011-06-14: 1500 mg via INTRAVENOUS
  Filled 2011-06-13 (×2): qty 1500

## 2011-06-13 MED ORDER — METOCLOPRAMIDE HCL 5 MG/ML IJ SOLN
INTRAMUSCULAR | Status: AC
  Filled 2011-06-13: qty 2

## 2011-06-13 MED ORDER — LORATADINE 10 MG PO TABS
10.0000 mg | ORAL_TABLET | Freq: Every day | ORAL | Status: DC
  Administered 2011-06-14 – 2011-06-21 (×8): 10 mg via ORAL
  Filled 2011-06-13 (×9): qty 1

## 2011-06-13 NOTE — H&P (Signed)
PCP:   Garlan Fillers, MD, MD   Chief Complaint:  Bleeding from a ulcer  HPI: 61 year old male who has a history of partial paraplegia from previous myelogram procedure per patient report about 4 years ago who is now chronically bedbound and lives at home and taking care of by his wife. He's had a chronic decubitus wound for years now and this wound has progressively worsened over the last year and he comes in today because it has been bleeding more than normal. He denies any fevers and has been experiencing some nausea and vomited once in the emergency department. He has a chronic indwelling Foley catheter. He denies any chest pain abdominal pain or shortness of breath however he does have a significant amount of chronic back pain. He's also got a ulcer to his left heel. He states he has not been on any recent antibiotics as he has a very hard time taking any oral antibiotics because they cause him to have a lot of side effects and diarrhea. He currently is in the emergency department has received IV vancomycin and Zosyn and has been pan cultured. He has had no mental status changes in his current mental status is normal.  Review of Systems:  Otherwise negative  Past Medical History: History reviewed. No pertinent past medical history. No past surgical history on file.  Medications: Prior to Admission medications   Medication Sig Start Date End Date Taking? Authorizing Provider  acetaminophen-codeine (TYLENOL #4) 300-60 MG per tablet Take 3 tablets by mouth every 4 (four) hours as needed.    Yes Historical Provider, MD  albuterol (PROVENTIL HFA;VENTOLIN HFA) 108 (90 BASE) MCG/ACT inhaler Inhale 2 puffs into the lungs every 6 (six) hours as needed. For shortness of breath   Yes Historical Provider, MD  budesonide (RHINOCORT AQUA) 32 MCG/ACT nasal spray Place 1 spray into the nose daily.   Yes Historical Provider, MD  carisoprodol (SOMA) 350 MG tablet Take 700 mg by mouth every 6 (six)  hours.   Yes Historical Provider, MD  cetirizine (ZYRTEC) 10 MG tablet Take 10 mg by mouth daily.   Yes Historical Provider, MD  clopidogrel (PLAVIX) 75 MG tablet Take 75 mg by mouth daily.   Yes Historical Provider, MD  collagenase (SANTYL) ointment Apply 1 application topically daily.   Yes Historical Provider, MD  diazepam (VALIUM) 5 MG tablet Take 10 mg by mouth every 6 (six) hours as needed.   Yes Historical Provider, MD  ferrous sulfate 325 (65 FE) MG tablet Take 650 mg by mouth daily.   Yes Historical Provider, MD  furosemide (LASIX) 20 MG tablet Take 40 mg by mouth 2 (two) times daily as needed.    Yes Historical Provider, MD  Multiple Vitamin (MULITIVITAMIN) LIQD Take 5 mLs by mouth daily.   Yes Historical Provider, MD  nitroGLYCERIN (NITROSTAT) 0.4 MG SL tablet Place 0.4 mg under the tongue every 5 (five) minutes as needed.   Yes Historical Provider, MD  oxyCODONE-acetaminophen (PERCOCET) 10-325 MG per tablet Take 3 tablets by mouth every 4 (four) hours as needed. For pain   Yes Historical Provider, MD  prochlorperazine (COMPAZINE) 10 MG tablet Take 10 mg by mouth every 6 (six) hours as needed.   Yes Historical Provider, MD  pyridOXINE (VITAMIN B-6) 50 MG tablet Take 50 mg by mouth daily.   Yes Historical Provider, MD  sucralfate (CARAFATE) 1 G tablet Take 1 g by mouth 3 (three) times daily as needed.   Yes Historical Provider, MD  Allergies:   Allergies  Allergen Reactions  . Ciprofloxacin   . Clonidine Derivatives   . Darvocet (Propoxyphene N-Acetaminophen)   . Decadron (Dexamethasone)   . Lipitor (Atorvastatin Calcium)   . Prednisolone     Social History:  does not have a smoking history on file. He does not have any smokeless tobacco history on file. His alcohol and drug histories not on file.   Family History: No family history on file.  Physical Exam: Filed Vitals:   06/13/11 1452 06/13/11 1630 06/13/11 1915 06/13/11 1936  BP: 156/66   150/71  Pulse: 92 88  91    Temp: 99.6 F (37.6 C)   99 F (37.2 C)  TempSrc: Oral     Resp: 20 26 25 27   SpO2: 93% 98%  98%   BP 150/71  Pulse 91  Temp(Src) 99 F (37.2 C) (Oral)  Resp 27  SpO2 98% General appearance: alert, cooperative and no distress nontoxic-appearing with normal mentation Lungs: clear to auscultation bilaterally no respiratory distress Heart: regular rate and rhythm, S1, S2 normal, no murmur, click, rub or gallop Abdomen: soft, non-tender; bowel sounds normal; no masses,  no organomegaly Extremities: extremities normal, atraumatic, no cyanosis or edema Pulses: 2+ and symmetric Skin: Skin color, texture, turgor normal. No rashes or lesions very large stage IV foul-smelling decubitus ulcer sacral area    Labs on Admission:   [facility] 06/13/11 1647  NA 130*  K 4.0  CL 95*  CO2 26  GLUCOSE 114*  BUN 8  CREATININE 0.59  CALCIUM 8.6  MG --  PHOS --    Basename 06/13/11 1647  AST 19  ALT 17  ALKPHOS 143*  BILITOT 0.1*  PROT 6.7  ALBUMIN 2.1*    Basename 06/13/11 1647  WBC 15.7*  NEUTROABS 13.3*  HGB 7.9*  HCT 25.7*  MCV 78.4  PLT 350    Radiological Exams on Admission: Ct Abdomen Pelvis W Contrast  06/13/2011  *RADIOLOGY REPORT*  Clinical Data: Bleeding sacral decubitus ulcer.  CT ABDOMEN AND PELVIS WITH CONTRAST  Technique:  Multidetector CT imaging of the abdomen and pelvis was performed following the standard protocol during bolus administration of intravenous contrast.  Contrast: OMNIPAQUE IOHEXOL 300 MG/ML IJ SOLN  Comparison: CT scan dated 02/18/2005  Findings: The liver, spleen, adrenal glands, and left kidney are normal.  Gallbladder has been removed.  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  There is an 11.6 cm simple appearing cyst in the lateral aspect of the right kidney.  The bowel appears normal. Foley catheter is in place in the bladder.  There is a prominent sacral decubitus ulcer in the midline.   There is adjacent cellulitis.  Air is seen in the soft tissues in the right buttock adjacent to the gluteal crease consistent with infection in the soft tissues.  There is also air in the subcutaneous fat of the perineum on images 97 through 99.  There is an abscess which tracks into the right buttock superficial to the right gluteus medius muscle.  There is air and a tiny amount of pus within this abscess. This appears to extend to the sacral decubitus ulcer.  Chronic sclerosis of the posterior aspect of the sacrum without discrete osteomyelitis.  IMPRESSION:  1.  Prominent sacral decubitus ulcer.  There is an abscess was extends from the right side of the sacral decubitus ulcer into the right buttock with a small amount of  air and pus within the abscess.  This is approximately 10 cm in length with only a 18 mm in diameter. 2. No acute intra-abdominal abnormality.  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 These are usually not aggressive lesions.  Original Report Authenticated By: Gwynn Burly, M.D.    Assessment/Plan Present on Admission:  61 year old male with sacral decubitus ulcer with what appears to be abscess formation  .Decubitus ulcer of buttock, stage 4 .Hyponatremia .UTI (lower urinary tract infection) .Anemia .Malnourished  Surgery has been called Dr. Ezzard Standing will see the patient either later tonight or in the morning to address whether or not there is any surgical intervention that is needed with this wound/abscess. He's had urine cultures blood cultures and a culture from the wound already. We'll place him on IV vancomycin and Zosyn. His lactic acid level is normal going to also add on a pro-calcitonin level. We'll place him on IV fluids overnight and hold his diuretics for now as he is mildly dehydrated. His albumin level is also low and we'll send for prealbumin level. Obtain wound care consultation along with physical  therapy consultation. Will place on telemetry floor for now. Monitor closely. He is mildly hypoxic in the emergency department going to add on a chest x-ray. Provide supportive oxygen as needed. Also sent off for an anemia panel. He may need blood transfusion during this hospitalization.   Khanh Tanori A [phone] 06/13/2011, 9:06 PM

## 2011-06-13 NOTE — ED Notes (Signed)
Family feels like he is loosing blood out of the wound and think he needs his HGB checked per EMS, there was only a tinge of blood on the sheet

## 2011-06-13 NOTE — ED Notes (Signed)
ZOX:[id number]<AV> Expected date:06/13/11<BR> Expected time: 2:39 PM<BR> Means of arrival:Ambulance<BR> Comments:<BR> M10. 61 yo m. req eval for wound check and b/p. Hx of same. 25 mins

## 2011-06-13 NOTE — ED Provider Notes (Signed)
History     CSN: [id number]  Arrival date & time 06/13/11  1448   First MD Initiated Contact with Patient 06/13/11 1528      Chief Complaint  Patient presents with  . Wound Check    Pt has a large decub on his sacral area odor is foul,. family wanted him seen here he goes to the wound care clinic   PCP Windy Carina, Eber Jones  (Consider location/radiation/quality/duration/timing/severity/associated sxs/prior treatment) HPI This 61 year old male is a paraplegic who has a chronic decubitus ulcer on his left buttocks region for the last 4 years which has waxed and waned in its size. Patient states of the last month has ulcer has become larger training intermittently both blood and pus with a foul odor. He also has had fevers to 104 multiple times over the last month. He has also had vomiting every couple of days or so over the last month. He has also had worse generalized weakness than usual the last month. He is no confusion chest pain or shortness of breath or cough. He has a chronic indwelling Foley catheter has chronic bowel and bladder incontinence. He has been bedridden for the last month however prior to that was able to transfer between a wheelchair to bed back and forth. He has not seen his doctor the last month and is going to her doctor's not seen his buttocks ulcer in the last 4 years. There's been no treatment prior to arrival. His pain is severe his pain is in his buttocks as well as his back he does have chronic back pain for years it is always severe. Past Medical History  Diagnosis Date  . Hypertension   . Coronary artery disease    Paraplegia s/p trauma Chronic indwelling Foley catheter.  5. Stage IV sacral decubitus of the left buttock.  6. Morbid obesity.  7. Chronic peripheral edema.  8. Hypertension.  9. T12 incomplete spinal cord injury.  10.Neurogenic bowel and bladder, chronic Foley catheter.  11.Chronic anemia.  12.Chronic sinusitis.  13.Coronary artery  disease with stent.  14.History of hyponatremia.  15.Gastroesophageal reflux disease.  Past Surgical History  Procedure Date  . Back surgery    Multiple back surgeries No family history on file.  History  Substance Use Topics  . Smoking status: Never Smoker   . Smokeless tobacco: Not on file  . Alcohol Use: Not on file      Review of Systems  Constitutional: Positive for fever.       10 Systems reviewed and are negative for acute change except as noted in the HPI.  HENT: Negative for congestion.   Eyes: Negative for discharge and redness.  Respiratory: Negative for cough and shortness of breath.   Cardiovascular: Negative for chest pain.  Gastrointestinal: Positive for nausea and vomiting. Negative for abdominal pain and diarrhea.  Musculoskeletal: Positive for back pain.  Skin: Positive for wound. Negative for rash.  Neurological: Negative for syncope, numbness and headaches.  Psychiatric/Behavioral:       No behavior change.    Allergies  Ciprofloxacin; Clonidine derivatives; Darvocet; Decadron; Lipitor; and Prednisolone  Home Medications   No current outpatient prescriptions on file.  BP 172/70  Pulse 69  Temp(Src) 97.8 F (36.6 C) (Oral)  Resp 20  Ht 6\' 7"  (2.007 m)  Wt 310 lb 4.8 oz (140.751 kg)  BMI 34.96 kg/m2  SpO2 98%  Physical Exam  Nursing note and vitals reviewed. Constitutional: He is oriented to person, place, and time.  Awake, alert, nontoxic appearance.  HENT:  Head: Atraumatic.  Eyes: Conjunctivae are normal. Pupils are equal, round, and reactive to light. Right eye exhibits no discharge. Left eye exhibits no discharge.  Neck: Neck supple.  Cardiovascular: Normal rate and regular rhythm.   No murmur heard. Pulmonary/Chest: Effort normal and breath sounds normal. No respiratory distress. He has no wheezes. He has no rales. He exhibits no tenderness.  Abdominal: Soft. Bowel sounds are normal. He exhibits no mass. There is no  tenderness. There is no rebound and no guarding.  Genitourinary:       Large full-thickness left sacral and gluteal region decubitus ulcer approximately 8-10 inches more in diameter and several cm deep with foul odor and purulent drainage as well as surrounding cellulitis/tenderness  Musculoskeletal: He exhibits no tenderness.       Baseline ROM, no obvious new focal weakness. Pt with good movement of arms and baseline paralysis/numbness of left leg and baseline minimal sensation and movement of right leg  Neurological: He is alert and oriented to person, place, and time.       Mental status and motor strength appears baseline for patient and situation.  Skin: No rash noted.  Psychiatric: He has a normal mood and affect.    ED Course  Procedures (including critical care time) ECG: Sinus rhythm, ventricular rate 80, normal axis, normal intervals, no acute ischemic changes noted, impression normal ECG, no significant change compared with November 2009 Labs Reviewed  CBC - Abnormal; Notable for the following:    WBC 15.7 (*)    RBC 3.28 (*)    Hemoglobin 7.9 (*)    HCT 25.7 (*)    MCH 24.1 (*)    All other components within normal limits  DIFFERENTIAL - Abnormal; Notable for the following:    Neutrophils Relative 85 (*)    Neutro Abs 13.3 (*)    Lymphocytes Relative 10 (*)    All other components within normal limits  COMPREHENSIVE METABOLIC PANEL - Abnormal; Notable for the following:    Sodium 130 (*)    Chloride 95 (*)    Glucose, Bld 114 (*)    Albumin 2.1 (*)    Alkaline Phosphatase 143 (*)    Total Bilirubin 0.1 (*)    All other components within normal limits  URINALYSIS, ROUTINE W REFLEX MICROSCOPIC - Abnormal; Notable for the following:    APPearance CLOUDY (*)    Specific Gravity, Urine 1.031 (*)    Protein, ur 30 (*)    Nitrite POSITIVE (*)    Leukocytes, UA MODERATE (*)    All other components within normal limits  URINE MICROSCOPIC-ADD ON - Abnormal; Notable for  the following:    Bacteria, UA MANY (*)    Crystals URIC ACID CRYSTALS (*)    All other components within normal limits  IRON AND TIBC - Abnormal; Notable for the following:    Iron <10 (*)    All other components within normal limits  VITAMIN B12 - Abnormal; Notable for the following:    Vitamin B-12 1079 (*)    All other components within normal limits  BASIC METABOLIC PANEL - Abnormal; Notable for the following:    Sodium 133 (*)    Creatinine, Ser 0.49 (*)    All other components within normal limits  CBC - Abnormal; Notable for the following:    WBC 11.5 (*)    RBC 3.11 (*)    Hemoglobin 7.5 (*)    HCT 24.7 (*)  MCH 24.1 (*)    All other components within normal limits  PRO B NATRIURETIC PEPTIDE - Abnormal; Notable for the following:    Pro B Natriuretic peptide (BNP) 228.1 (*)    All other components within normal limits  PREALBUMIN - Abnormal; Notable for the following:    Prealbumin 4.0 (*)    All other components within normal limits  LACTIC ACID, PLASMA  TYPE AND SCREEN  WOUND CULTURE  POCT I-STAT TROPONIN I  ABO/RH  FERRITIN  TSH  PROCALCITONIN  CULTURE, BLOOD (ROUTINE X 2)  CULTURE, BLOOD (ROUTINE X 2)  URINE CULTURE  FOLATE RBC  CBC  DIFFERENTIAL  BASIC METABOLIC PANEL   Ct Abdomen Pelvis W Contrast  06/13/2011  *RADIOLOGY REPORT*  Clinical Data: Bleeding sacral decubitus ulcer.  CT ABDOMEN AND PELVIS WITH CONTRAST  Technique:  Multidetector CT imaging of the abdomen and pelvis was performed following the standard protocol during bolus administration of intravenous contrast.  Contrast: OMNIPAQUE IOHEXOL 300 MG/ML IJ SOLN  Comparison: CT scan dated 02/18/2005  Findings: The liver, spleen, adrenal glands, and left kidney are normal.  Gallbladder has been removed.  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  There is an 11.6 cm simple appearing cyst in the lateral aspect of the right kidney.  The bowel appears  normal. Foley catheter is in place in the bladder.  There is a prominent sacral decubitus ulcer in the midline.  There is adjacent cellulitis.  Air is seen in the soft tissues in the right buttock adjacent to the gluteal crease consistent with infection in the soft tissues.  There is also air in the subcutaneous fat of the perineum on images 97 through 99.  There is an abscess which tracks into the right buttock superficial to the right gluteus medius muscle.  There is air and a tiny amount of pus within this abscess. This appears to extend to the sacral decubitus ulcer.  Chronic sclerosis of the posterior aspect of the sacrum without discrete osteomyelitis.  IMPRESSION:  1.  Prominent sacral decubitus ulcer.  There is an abscess was extends from the right side of the sacral decubitus ulcer into the right buttock with a small amount of air and pus within the abscess.  This is approximately 10 cm in length with only a 18 mm in diameter. 2. No acute intra-abdominal abnormality.  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 These are usually not aggressive lesions.  Original Report Authenticated By: Gwynn Burly, M.D.   Dg Chest Port 1 View  06/13/2011  *RADIOLOGY REPORT*  Clinical Data: Shortness of breath and cough.  PORTABLE CHEST - 1 VIEW  Comparison: 02/09/2008  Findings: Heart size and vascularity are normal and the lungs are clear.  No osseous abnormality.  IMPRESSION: Normal chest.  Original Report Authenticated By: Gwynn Burly, M.D.     1. Decubitus ulcer of buttock   2. Cellulitis, gluteal   3. Gluteal abscess       MDM  The patient appears reasonably stabilized for admission considering the current resources, flow, and capabilities available in the ED at this time, and I doubt any other [facility] requiring further screening and/or treatment in the ED prior to admission.I doubt septic shock in ED.Pt remains awake, alert, nontoxic on  repeat rechecks.Feel Med admit and rec Med will consult Surg for wound care.  Hurman Horn, MD 06/14/11 2108

## 2011-06-13 NOTE — ED Notes (Signed)
Gave pt cup for gold colored necklace with cross pendant. Pt has necklace in his possession. Pt label on cup.

## 2011-06-13 NOTE — ED Notes (Signed)
Spoke with wife about pt care and pt situation.

## 2011-06-14 ENCOUNTER — Other Ambulatory Visit (HOSPITAL_COMMUNITY): Payer: 59

## 2011-06-14 ENCOUNTER — Encounter (HOSPITAL_COMMUNITY): Payer: Self-pay | Admitting: *Deleted

## 2011-06-14 LAB — IRON AND TIBC: UIBC: 134 ug/dL (ref 125–400)

## 2011-06-14 LAB — VITAMIN B12: Vitamin B-12: 1079 pg/mL — ABNORMAL HIGH (ref 211–911)

## 2011-06-14 LAB — FERRITIN: Ferritin: 212 ng/mL (ref 22–322)

## 2011-06-14 LAB — CBC
HCT: 24.7 % — ABNORMAL LOW (ref 39.0–52.0)
Hemoglobin: 7.5 g/dL — ABNORMAL LOW (ref 13.0–17.0)
MCH: 24.1 pg — ABNORMAL LOW (ref 26.0–34.0)
MCV: 79.4 fL (ref 78.0–100.0)
Platelets: 300 10*3/uL (ref 150–400)
RBC: 3.11 MIL/uL — ABNORMAL LOW (ref 4.22–5.81)

## 2011-06-14 LAB — BASIC METABOLIC PANEL
CO2: 27 mEq/L (ref 19–32)
Calcium: 8.5 mg/dL (ref 8.4–10.5)
Glucose, Bld: 99 mg/dL (ref 70–99)
Sodium: 133 mEq/L — ABNORMAL LOW (ref 135–145)

## 2011-06-14 MED ORDER — SODIUM CHLORIDE 0.9 % IV SOLN
500.0000 mg | Freq: Every day | INTRAVENOUS | Status: DC
  Administered 2011-06-15: 500 mg via INTRAVENOUS
  Filled 2011-06-14 (×4): qty 10

## 2011-06-14 MED ORDER — OXYCODONE HCL 5 MG PO TABS
10.0000 mg | ORAL_TABLET | ORAL | Status: DC | PRN
  Administered 2011-06-14 – 2011-06-21 (×32): 10 mg via ORAL
  Filled 2011-06-14 (×33): qty 2

## 2011-06-14 MED ORDER — CARISOPRODOL 350 MG PO TABS
700.0000 mg | ORAL_TABLET | Freq: Four times a day (QID) | ORAL | Status: DC
  Administered 2011-06-14 – 2011-06-16 (×9): 700 mg via ORAL
  Filled 2011-06-14 (×2): qty 2
  Filled 2011-06-14: qty 1
  Filled 2011-06-14 (×6): qty 2
  Filled 2011-06-14: qty 1

## 2011-06-14 MED ORDER — SODIUM CHLORIDE 0.9 % IV SOLN
25.0000 mg | Freq: Once | INTRAVENOUS | Status: AC
  Administered 2011-06-15: 25 mg via INTRAVENOUS
  Filled 2011-06-14 (×2): qty 0.5

## 2011-06-14 MED ORDER — VANCOMYCIN HCL 1000 MG IV SOLR
1250.0000 mg | Freq: Two times a day (BID) | INTRAVENOUS | Status: DC
  Administered 2011-06-14 – 2011-06-17 (×6): 1250 mg via INTRAVENOUS
  Filled 2011-06-14 (×7): qty 1250

## 2011-06-14 MED ORDER — CHLORHEXIDINE GLUCONATE 4 % EX LIQD
1.0000 "application " | Freq: Once | CUTANEOUS | Status: AC
  Administered 2011-06-15: 1 via TOPICAL
  Filled 2011-06-14 (×2): qty 15

## 2011-06-14 MED ORDER — CHLORHEXIDINE GLUCONATE 4 % EX LIQD
1.0000 "application " | Freq: Once | CUTANEOUS | Status: DC
  Filled 2011-06-14: qty 15

## 2011-06-14 MED ORDER — OXYCODONE-ACETAMINOPHEN 5-325 MG PO TABS
2.0000 | ORAL_TABLET | ORAL | Status: DC | PRN
  Administered 2011-06-14 – 2011-06-21 (×36): 2 via ORAL
  Filled 2011-06-14 (×37): qty 2

## 2011-06-14 NOTE — Consult Note (Signed)
WOC consult Note Reason for Consult: Pressure Ulcer Sacrum Patient not seen today as MD has simultaneously requested both WOC and Surgical Consults.  I will stand by and defer to CCS for examination and opinion on the recommended POC for this patient. I will not follow unless requested/directed. Thanks, Ladona Mow, MSN, RN, GNP, CWOCN [phone])

## 2011-06-14 NOTE — Progress Notes (Signed)
  Pharmacy Note (Brief) IV Iron Dosing to Target Hgb =10  Ht=6'7" Wt= 140.8kg (dosing wt=93.7kg) Current Hgb=7.5 Target Hgb=10  61 yo M to receive IV iron to target Hgb of 10. Will order Iron Dextran: 25mg  test dose, followed by 500mg  IV today, then 500mg  IV tomorrow. Will check daily CBC. Pharmacy will continue to follow.  Darrol Angel, PharmD Pager: [phone] 06/14/2011 Thomasena Edis, Cathlean Cower

## 2011-06-14 NOTE — Progress Notes (Signed)
Attempt to asses pt's sacral area not receptive at this time.c/o of IV soreness. Site red removed IV and IV team notified to restart and assess previous site.cont with plan of care

## 2011-06-14 NOTE — Progress Notes (Addendum)
PT/OT/ST Cancellation Note  ___Treatment cancelled today due to medical issues with patient which prohibited therapy  ___ Treatment cancelled today due to patient receiving procedure or test   ___ Treatment cancelled today due to patient's refusal to participate   _x__ Treatment cancelled today due to pt awaiting surgery consult and wound care consult for decubitus ulcer/abscess. Also pt's HGB 7.5 and pt is on strict bedrest. Will hold therapy today. Will check back another day.    Signature: Rebeca Alert, PT [phone]

## 2011-06-14 NOTE — Consult Note (Signed)
Reason for Consult:Sacral decubitus Referring Physician: Dr. Tarry Kos  [name] is an 61 y.o. male.  HPI: Pt is s/p BLE paresis following myelogram in 2009. Developed a sacral hematoma during a move in inpatient rehab and has had a wound there ever since. Had been doing well and had the wound down to the size of a quarter or so when about a month ago it started worsening. Has had some significant bleeding from the wound in the past month and has had some presyncopal symptoms and weakness that he attributes to that. Has been treating the wound with enzymatic debriders and dressings every other day on average. Has had problems with fecal soilage during bowel regimen. Is sensate there and requires topical lidocaine for dressing changes. 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  Past Medical History  Diagnosis Date  . Hypertension   . Coronary artery disease     Past Surgical History  Procedure Date  . Back surgery     No family history on file.  Social History:  reports that he has never smoked. He does not have any smokeless tobacco history on file. His alcohol and drug histories not on file.  Allergies:  Allergies  Allergen Reactions  . Ciprofloxacin   . Clonidine Derivatives   . Darvocet (Propoxyphene N-Acetaminophen)   . Decadron (Dexamethasone)   . Lipitor (Atorvastatin Calcium)   . Prednisolone     Medications: I have reviewed the patient's current medications.  Results for orders placed during the hospital encounter of 06/13/11 (from the past 48 hour(s))  LACTIC ACID, PLASMA     Status: Normal   Collection Time   06/13/11  4:47 PM      Component Value Range Comment   Lactic Acid, Venous 1.3  0.5 - 2.2 (mmol/L)   TYPE AND SCREEN     Status: Normal   Collection Time   06/13/11  4:47 PM      Component Value Range Comment   ABO/RH(D) A POS      Antibody Screen NEG      Sample Expiration 06/16/2011     CBC     Status: Abnormal   Collection Time     06/13/11  4:47 PM      Component Value Range Comment   WBC 15.7 (*) 4.0 - 10.5 (K/uL)    RBC 3.28 (*) 4.22 - 5.81 (MIL/uL)    Hemoglobin 7.9 (*) 13.0 - 17.0 (g/dL)    HCT 04.5 (*) 40.9 - 52.0 (%)    MCV 78.4  78.0 - 100.0 (fL)    MCH 24.1 (*) 26.0 - 34.0 (pg)    MCHC 30.7  30.0 - 36.0 (g/dL)    RDW 81.1  91.4 - 78.2 (%)    Platelets 350  150 - 400 (K/uL)   DIFFERENTIAL     Status: Abnormal   Collection Time   06/13/11  4:47 PM      Component Value Range Comment   Neutrophils Relative 85 (*) 43 - 77 (%)    Neutro Abs 13.3 (*) 1.7 - 7.7 (K/uL)    Lymphocytes Relative 10 (*) 12 - 46 (%)    Lymphs Abs 1.6  0.7 - 4.0 (K/uL)    Monocytes Relative 5  3 - 12 (%)    Monocytes Absolute 0.8  0.1 - 1.0 (K/uL)    Eosinophils Relative 0  0 - 5 (%)    Eosinophils Absolute 0.1  0.0 - 0.7 (  K/uL)    Basophils Relative 0  0 - 1 (%)    Basophils Absolute 0.0  0.0 - 0.1 (K/uL)   COMPREHENSIVE METABOLIC PANEL     Status: Abnormal   Collection Time   06/13/11  4:47 PM      Component Value Range Comment   Sodium 130 (*) 135 - 145 (mEq/L)    Potassium 4.0  3.5 - 5.1 (mEq/L)    Chloride 95 (*) 96 - 112 (mEq/L)    CO2 26  19 - 32 (mEq/L)    Glucose, Bld 114 (*) 70 - 99 (mg/dL)    BUN 8  6 - 23 (mg/dL)    Creatinine, Ser 1.61  0.50 - 1.35 (mg/dL)    Calcium 8.6  8.4 - 10.5 (mg/dL)    Total Protein 6.7  6.0 - 8.3 (g/dL)    Albumin 2.1 (*) 3.5 - 5.2 (g/dL)    AST 19  0 - 37 (U/L)    ALT 17  0 - 53 (U/L)    Alkaline Phosphatase 143 (*) 39 - 117 (U/L)    Total Bilirubin 0.1 (*) 0.3 - 1.2 (mg/dL)    GFR calc non Af Amer >90  >90 (mL/min)    GFR calc Af Amer >90  >90 (mL/min)   WOUND CULTURE     Status: Normal (Preliminary result)   Collection Time   06/13/11  4:48 PM      Component Value Range Comment   Specimen Description PERIRECTAL      Special Requests NONE      Gram Stain        Value: NO WBC SEEN     NO SQUAMOUS EPITHELIAL CELLS SEEN     RARE GRAM POSITIVE COCCI IN PAIRS     RARE GRAM NEGATIVE  RODS   Culture NO GROWTH      Report Status PENDING     URINALYSIS, ROUTINE W REFLEX MICROSCOPIC     Status: Abnormal   Collection Time   06/13/11  5:06 PM      Component Value Range Comment   Color, Urine YELLOW  YELLOW     APPearance CLOUDY (*) CLEAR     Specific Gravity, Urine 1.031 (*) 1.005 - 1.030     pH 5.5  5.0 - 8.0     Glucose, UA NEGATIVE  NEGATIVE (mg/dL)    Hgb urine dipstick NEGATIVE  NEGATIVE     Bilirubin Urine NEGATIVE  NEGATIVE     Ketones, ur NEGATIVE  NEGATIVE (mg/dL)    Protein, ur 30 (*) NEGATIVE (mg/dL)    Urobilinogen, UA 1.0  0.0 - 1.0 (mg/dL)    Nitrite POSITIVE (*) NEGATIVE     Leukocytes, UA MODERATE (*) NEGATIVE    POCT I-STAT TROPONIN I     Status: Normal   Collection Time   06/13/11  5:06 PM      Component Value Range Comment   Troponin i, poc 0.01  0.00 - 0.08 (ng/mL)    Comment 3            URINE MICROSCOPIC-ADD ON     Status: Abnormal   Collection Time   06/13/11  5:06 PM      Component Value Range Comment   Squamous Epithelial / LPF RARE  RARE     WBC, UA TOO NUMEROUS TO COUNT  <3 (WBC/hpf)    Bacteria, UA MANY (*) RARE     Crystals URIC ACID CRYSTALS (*) NEGATIVE    ABO/RH  Status: Normal   Collection Time   06/13/11  7:21 PM      Component Value Range Comment   ABO/RH(D) A POS     IRON AND TIBC     Status: Abnormal   Collection Time   06/13/11 11:49 PM      Component Value Range Comment   Iron <10 (*) 42 - 135 (ug/dL)    TIBC Not calculated due to Iron <10.  215 - 435 (ug/dL)    Saturation Ratios Not calculated due to Iron <10.  20 - 55 (%)    UIBC 134  125 - 400 (ug/dL)   FERRITIN     Status: Normal   Collection Time   06/13/11 11:49 PM      Component Value Range Comment   Ferritin 212  22 - 322 (ng/mL)   VITAMIN B12     Status: Abnormal   Collection Time   06/13/11 11:49 PM      Component Value Range Comment   Vitamin B-12 1079 (*) 211 - 911 (pg/mL)   TSH     Status: Normal   Collection Time   06/13/11 11:49 PM      Component Value  Range Comment   TSH 1.087  0.350 - 4.500 (uIU/mL)   PRO B NATRIURETIC PEPTIDE     Status: Abnormal   Collection Time   06/13/11 11:49 PM      Component Value Range Comment   Pro B Natriuretic peptide (BNP) 228.1 (*) 0 - 125 (pg/mL)   PREALBUMIN     Status: Abnormal   Collection Time   06/13/11 11:49 PM      Component Value Range Comment   Prealbumin 4.0 (*) 17.0 - 34.0 (mg/dL)   PROCALCITONIN     Status: Normal   Collection Time   06/13/11 11:49 PM      Component Value Range Comment   Procalcitonin 0.15     BASIC METABOLIC PANEL     Status: Abnormal   Collection Time   06/14/11  4:49 AM      Component Value Range Comment   Sodium 133 (*) 135 - 145 (mEq/L)    Potassium 3.7  3.5 - 5.1 (mEq/L)    Chloride 100  96 - 112 (mEq/L)    CO2 27  19 - 32 (mEq/L)    Glucose, Bld 99  70 - 99 (mg/dL)    BUN 6  6 - 23 (mg/dL)    Creatinine, Ser 1.61 (*) 0.50 - 1.35 (mg/dL)    Calcium 8.5  8.4 - 10.5 (mg/dL)    GFR calc non Af Amer >90  >90 (mL/min)    GFR calc Af Amer >90  >90 (mL/min)   CBC     Status: Abnormal   Collection Time   06/14/11  4:49 AM      Component Value Range Comment   WBC 11.5 (*) 4.0 - 10.5 (K/uL)    RBC 3.11 (*) 4.22 - 5.81 (MIL/uL)    Hemoglobin 7.5 (*) 13.0 - 17.0 (g/dL)    HCT 09.6 (*) 04.5 - 52.0 (%)    MCV 79.4  78.0 - 100.0 (fL)    MCH 24.1 (*) 26.0 - 34.0 (pg)    MCHC 30.4  30.0 - 36.0 (g/dL)    RDW 40.9  81.1 - 91.4 (%)    Platelets 300  150 - 400 (K/uL)     Ct Abdomen Pelvis W Contrast  06/13/2011  *RADIOLOGY REPORT*  Clinical Data: Bleeding sacral  decubitus ulcer.  CT ABDOMEN AND PELVIS WITH CONTRAST  Technique:  Multidetector CT imaging of the abdomen and pelvis was performed following the standard protocol during bolus administration of intravenous contrast.  Contrast: OMNIPAQUE IOHEXOL 300 MG/ML IJ SOLN  Comparison: CT scan dated 02/18/2005  Findings: The liver, spleen, adrenal glands, and left kidney are normal.  Gallbladder has been removed.  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  There is an 11.6 cm simple appearing cyst in the lateral aspect of the right kidney.  The bowel appears normal. Foley catheter is in place in the bladder.  There is a prominent sacral decubitus ulcer in the midline.  There is adjacent cellulitis.  Air is seen in the soft tissues in the right buttock adjacent to the gluteal crease consistent with infection in the soft tissues.  There is also air in the subcutaneous fat of the perineum on images 97 through 99.  There is an abscess which tracks into the right buttock superficial to the right gluteus medius muscle.  There is air and a tiny amount of pus within this abscess. This appears to extend to the sacral decubitus ulcer.  Chronic sclerosis of the posterior aspect of the sacrum without discrete osteomyelitis.  IMPRESSION:  1.  Prominent sacral decubitus ulcer.  There is an abscess was extends from the right side of the sacral decubitus ulcer into the right buttock with a small amount of air and pus within the abscess.  This is approximately 10 cm in length with only a 18 mm in diameter. 2. No acute intra-abdominal abnormality.  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 These are usually not aggressive lesions.  Original Report Authenticated By: Gwynn Burly, M.D.   Dg Chest Port 1 View  06/13/2011  *RADIOLOGY REPORT*  Clinical Data: Shortness of breath and cough.  PORTABLE CHEST - 1 VIEW  Comparison: 02/09/2008  Findings: Heart size and vascularity are normal and the lungs are clear.  No osseous abnormality.  IMPRESSION: Normal chest.  Original Report Authenticated By: Gwynn Burly, M.D.    Review of Systems  Constitutional: Negative for fever and chills.  Musculoskeletal: Positive for back pain.  Neurological: Positive for dizziness and weakness.   Blood pressure 172/75, pulse 88, temperature 98.9 F (37.2 C),  temperature source Oral, resp. rate 20, height 6\' 7"  (2.007 m), weight 140.751 kg (310 lb 4.8 oz), SpO2 97.00%. Physical Exam  Constitutional: He appears well-developed and well-nourished.  Cardiovascular: Normal rate and regular rhythm.   Respiratory: Effort normal and breath sounds normal. No respiratory distress. He has no wheezes. He has no rales.  GI: Soft. Bowel sounds are normal.  Musculoskeletal:       Back:  Psychiatric: He has a normal mood and affect.    Assessment/Plan: Stage 4 sacral decub -- Though no obvious e/o osteomyelitis on CT it is certainly possible given the location of the abscess adjacent to the right bony pelvis. An MR may be warranted to rule this out. The wound itself is likely going to require surgical debribement in the OR (I don't think he will tolerate it bedside). We did discuss the possiblity of diverting colostomy given its proximity to the anus though that is premature at this point. Agree with Zosyn as last cultures of this wound (2009) grew P. Aeruginosa susceptible to Zosyn. Will d/w MD.  Freeman Caldron.  06/14/2011, 12:10 PM

## 2011-06-14 NOTE — Progress Notes (Signed)
ANTIBIOTIC CONSULT NOTE - INITIAL  Pharmacy Consult for Vancomycin and Zosyn  Indication: Decub sarcal  Allergies  Allergen Reactions  . Ciprofloxacin   . Clonidine Derivatives   . Darvocet (Propoxyphene N-Acetaminophen)   . Decadron (Dexamethasone)   . Lipitor (Atorvastatin Calcium)   . Prednisolone     Patient Measurements: Height: 6\' 7"  (200.7 cm) Weight: 306 lb 14.4 oz (139.209 kg) IBW/kg (Calculated) : 93.7  Adjusted Body Weight:   Vital Signs: Temp: 98.3 F (36.8 C) (03/07 2257) Temp src: Oral (03/07 2257) BP: 110/66 mmHg (03/07 2257) Pulse Rate: 88  (03/07 2257) Intake/Output from previous day: 03/07 0701 - 03/08 0700 In: 3 [I.V.:3] Out: 700 [Urine:700] Intake/Output from this shift: Total I/O In: 3 [I.V.:3] Out: 700 [Urine:700]  Labs:  [facility] 06/13/11 1647  WBC 15.7*  HGB 7.9*  PLT 350  LABCREA --  CREATININE 0.59   Estimated Creatinine Clearance: 155.4 ml/min (by C-G formula based on Cr of 0.59). No results found for this basename: VANCOTROUGH:2,VANCOPEAK:2,VANCORANDOM:2,GENTTROUGH:2,GENTPEAK:2,GENTRANDOM:2,TOBRATROUGH:2,TOBRAPEAK:2,TOBRARND:2,AMIKACINPEAK:2,AMIKACINTROU:2,AMIKACIN:2, in the last 72 hours   Microbiology: No results found for this or any previous visit (from the past 720 hour(s)).  Medical History: Past Medical History  Diagnosis Date  . Hypertension   . Coronary artery disease     Medications:  Anti-infectives     Start     Dose/Rate Route Frequency Ordered Stop   06/14/11 0000   vancomycin (VANCOCIN) 1,500 mg in sodium chloride 0.9 % 500 mL IVPB        1,500 mg 250 mL/hr over 120 Minutes Intravenous Every 12 hours 06/13/11 2313     06/13/11 2315   piperacillin-tazobactam (ZOSYN) IVPB 3.375 g        3.375 g 12.5 mL/hr over 240 Minutes Intravenous 3 times per day 06/13/11 2313     06/13/11 1700   vancomycin (VANCOCIN) IVPB 1000 mg/200 mL premix        1,000 mg 200 mL/hr over 60 Minutes Intravenous To Emergency Dept  06/13/11 1549 06/13/11 1745   06/13/11 1700   piperacillin-tazobactam (ZOSYN) IVPB 3.375 g        3.375 g 100 mL/hr over 30 Minutes Intravenous To Emergency Dept 06/13/11 1549 06/13/11 1714         Assessment: Patient with decubitus wound, chronic.  First dose of antibiotics already given in ED.    Goal of Therapy:  Vancomycin trough level 15-20 mcg/ml Zosyn based on renal function   Plan:  Measure antibiotic drug levels at steady state Follow up culture results Vancomycin 1500mg  iv q12hr ordered and 1st dose given, but will change to 1250mg  iv q12hr, due to CrCl may be falsely increased due to low CrCl due to paraplegia Zosyn 3.375g IV Q8H infused over 4hrs.   Darlina Guys, Jacquenette Shone Crowford 06/14/2011,3:32 AM

## 2011-06-14 NOTE — Consult Note (Signed)
WOC consult Note Contacted by Staff RN for Bariatric bed with air overlay for treatment for Stage IV Pressure Ulcer.  RN also requested trapeze to assist patient in repositioning maneuvers since he has one at home. I have ordered both. Thanks, Ladona Mow, MSN, RN, [facility], CWOCN [phone])

## 2011-06-14 NOTE — Progress Notes (Signed)
Subjective: Patient states that he's been under the care of the wound care center for both a little in his back and his leg. He states that in the last 2 weeks he's had significant bleeding from the wound on his back and has not been seen by clinician since the bleeding started. On yesterday his wife took photographs and set into the physician at the Wound Care Center who advised that he come to the emergency room for further evaluation and management. The patient states that the wound has been dressed with multiple dressings including silver alginate and occlusive dressings. The patient is awake alert and oriented x3 in no acute distress. His only complaint is that his pain medications are not being administered as as they usually are at home.  Objective: Filed Vitals:   06/13/11 1936 06/13/11 2257 06/14/11 0457 06/14/11 1537  BP: 150/71 110/66 172/75 172/70  Pulse: 91 88 88 69  Temp: 99 F (37.2 C) 98.3 F (36.8 C) 98.9 F (37.2 C) 97.8 F (36.6 C)  TempSrc:  Oral Oral Oral  Resp: 27 22 20 20   Height:  6\' 7"  (2.007 m)    Weight:  139.209 kg (306 lb 14.4 oz) 140.751 kg (310 lb 4.8 oz)   SpO2: 98% 93% 97% 98%   Weight change:   Intake/Output Summary (Last 24 hours) at 06/14/11 1923 Last data filed at 06/14/11 1400  Gross per 24 hour  Intake 2203.67 ml  Output   1500 ml  Net 703.67 ml    General: Alert, awake, oriented x3, in no acute distress.  HEENT: [place]/AT PEERL, EOMI Neck: Trachea midline,  no masses, no thyromegal,y no JVD, no carotid bruit OROPHARYNX:  Moist, No exudate/ erythema/lesions.  Heart: Regular rate and rhythm, without murmurs, rubs, gallops, PMI non-displaced, no heaves or thrills on palpation.  Lungs: Clear to auscultation, no wheezing or rhonchi noted. No increased vocal fremitus resonant to percussion  Abdomen: Soft, nontender, nondistended, positive bowel sounds, no masses no hepatosplenomegaly noted..  Neuro: No focal neurological deficits noted cranial nerves  II through XII grossly intact. DTRs 2+ bilaterally upper and lower extremities. Strength 5/5 in bilateral upper extremities. Pt is paraplegic. Musculoskeletal: No warm swelling or erythema around joints, no spinal tenderness noted. Skin: Large stage IV decubitus ulcer at the sacral region. Patient also has a stage IV pressure ulcer on the left heel. Lab Results:  [facility] 06/14/11 0449 06/13/11 1647  NA 133* 130*  K 3.7 4.0  CL 100 95*  CO2 27 26  GLUCOSE 99 114*  BUN 6 8  CREATININE 0.49* 0.59  CALCIUM 8.5 8.6  MG -- --  PHOS -- --    Basename 06/13/11 1647  AST 19  ALT 17  ALKPHOS 143*  BILITOT 0.1*  PROT 6.7  ALBUMIN 2.1*   No results found for this basename: LIPASE:2,AMYLASE:2 in the last 72 hours  Basename 06/14/11 0449 06/13/11 1647  WBC 11.5* 15.7*  NEUTROABS -- 13.3*  HGB 7.5* 7.9*  HCT 24.7* 25.7*  MCV 79.4 78.4  PLT 300 350   No results found for this basename: CKTOTAL:3,CKMB:3,CKMBINDEX:3,TROPONINI:3 in the last 72 hours No components found with this basename: POCBNP:3 No results found for this basename: DDIMER:2 in the last 72 hours No results found for this basename: HGBA1C:2 in the last 72 hours No results found for this basename: CHOL:2,HDL:2,LDLCALC:2,TRIG:2,CHOLHDL:2,LDLDIRECT:2 in the last 72 hours  Basename 06/13/11 2349  TSH 1.087  T4TOTAL --  T3FREE --  THYROIDAB --    Alvira Philips  06/13/11 2349  VITAMINB12 1079*  FOLATE --  FERRITIN 212  TIBC Not calculated due to Iron <10.  IRON <10*  RETICCTPCT --    Micro Results: Recent Results (from the past 240 hour(s))  WOUND CULTURE     Status: Normal (Preliminary result)   Collection Time   06/13/11  4:48 PM      Component Value Range Status Comment   Specimen Description PERIRECTAL   Final    Special Requests NONE   Final    Gram Stain     Final    Value: NO WBC SEEN     NO SQUAMOUS EPITHELIAL CELLS SEEN     RARE GRAM POSITIVE COCCI IN PAIRS     RARE GRAM NEGATIVE RODS   Culture NO  GROWTH   Final    Report Status PENDING   Incomplete     Studies/Results: Ct Abdomen Pelvis W Contrast  06/13/2011  *RADIOLOGY REPORT*  Clinical Data: Bleeding sacral decubitus ulcer.  CT ABDOMEN AND PELVIS WITH CONTRAST  Technique:  Multidetector CT imaging of the abdomen and pelvis was performed following the standard protocol during bolus administration of intravenous contrast.  Contrast: OMNIPAQUE IOHEXOL 300 MG/ML IJ SOLN  Comparison: CT scan dated 02/18/2005  Findings: The liver, spleen, adrenal glands, and left kidney are normal.  Gallbladder has been removed.  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  There is an 11.6 cm simple appearing cyst in the lateral aspect of the right kidney.  The bowel appears normal. Foley catheter is in place in the bladder.  There is a prominent sacral decubitus ulcer in the midline.  There is adjacent cellulitis.  Air is seen in the soft tissues in the right buttock adjacent to the gluteal crease consistent with infection in the soft tissues.  There is also air in the subcutaneous fat of the perineum on images 97 through 99.  There is an abscess which tracks into the right buttock superficial to the right gluteus medius muscle.  There is air and a tiny amount of pus within this abscess. This appears to extend to the sacral decubitus ulcer.  Chronic sclerosis of the posterior aspect of the sacrum without discrete osteomyelitis.  IMPRESSION:  1.  Prominent sacral decubitus ulcer.  There is an abscess was extends from the right side of the sacral decubitus ulcer into the right buttock with a small amount of air and pus within the abscess.  This is approximately 10 cm in length with only a 18 mm in diameter. 2. No acute intra-abdominal abnormality.  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 These are usually not aggressive lesions.  Original Report Authenticated By:  Gwynn Burly, M.D.   Dg Chest Port 1 View  06/13/2011  *RADIOLOGY REPORT*  Clinical Data: Shortness of breath and cough.  PORTABLE CHEST - 1 VIEW  Comparison: 02/09/2008  Findings: Heart size and vascularity are normal and the lungs are clear.  No osseous abnormality.  IMPRESSION: Normal chest.  Original Report Authenticated By: Gwynn Burly, M.D.    Medications: I have reviewed the patient's current medications. Scheduled Meds:   . carisoprodol  700 mg Oral Q6H  . chlorhexidine  1 application Topical Once  . chlorhexidine  1 application Topical Once  . ferrous sulfate  650 mg Oral Daily  . fluticasone  2 spray Each Nare Daily  .  iron dextran (INFED/DEXFERRUM) IVPB (TEST DOSE)  25 mg Intravenous Once  . iron dextran (INFED/DEXFERRRUM) 500 MG IVPB  500 mg Intravenous Daily  . loratadine  10 mg Oral Daily  . metoCLOPramide (REGLAN) injection  10 mg Intravenous Once  . mulitivitamin  5 mL Oral Daily  . ondansetron (ZOFRAN) IV  4 mg Intravenous Once  . piperacillin-tazobactam (ZOSYN)  IV  3.375 g Intravenous Q8H  . pyridOXINE  50 mg Oral Daily  . sodium chloride  3 mL Intravenous Q12H  . vancomycin  1,250 mg Intravenous Q12H  . DISCONTD: carisoprodol  350 mg Oral Once  . DISCONTD: carisoprodol  700 mg Oral Q6H  . DISCONTD: vancomycin  1,500 mg Intravenous Q12H   Continuous Infusions:   . 0.9 % NaCl with KCl 20 mEq / L 100 mL/hr at 06/14/11 0002  . DISCONTD: sodium chloride     PRN Meds:.albuterol, diazepam, HYDROmorphone, ondansetron (ZOFRAN) IV, ondansetron, oxyCODONE, oxyCODONE-acetaminophen, DISCONTD: acetaminophen-codeine, DISCONTD: ondansetron (ZOFRAN) IV, DISCONTD: oxyCODONE, DISCONTD: oxyCODONE-acetaminophen, DISCONTD: oxyCODONE-acetaminophen Assessment/Plan: Patient Active Hospital Problem List: Decubitus ulcer of buttock, stage 4 (06/13/2011)   Assessment: Pt will be seen by Surgery and evaluated for surgical debridement. I suspect this patient may have osteomyelitis  and may require an MRI however due to his mental implants he is unlikely a candidate for an MRI. I will await evaluation by surgery. Continue vancomycin and Zosyn and local wound dressings.    UTI (lower urinary tract infection) (06/13/2011)   Assessment: Urine culture pending patient currently on vancomycin and Zosyn we'll continue   Anemia (06/13/2011)   Assessment: Serum iron less than 10. Will order IV iron per pharmacy consult    Hyponatremia (06/13/2011)   Assessment: This is improved with IV fluids.    Paraplegia following spinal cord injury (06/13/2011)   Assessment: Chronic condition contributing to his decubitus ulcers.    Malnourished (06/13/2011)   Assessment: A dietary consult.     LOS: 1 day

## 2011-06-14 NOTE — Consult Note (Signed)
Pt needs surgical debridement with tissue necrosis.  Given sensitivity, would be best to do in OR - probably Sat AM Agree with MRI to evaluate sacrum. Would benefit from fecal diversion but hold off for now until infection/cellulitits under better control

## 2011-06-15 LAB — BASIC METABOLIC PANEL
Calcium: 8.2 mg/dL — ABNORMAL LOW (ref 8.4–10.5)
Creatinine, Ser: 0.56 mg/dL (ref 0.50–1.35)
GFR calc non Af Amer: >90 mL/min (ref >90)
Sodium: 136 mEq/L (ref 135–145)

## 2011-06-15 LAB — CBC
MCH: 24 pg — ABNORMAL LOW (ref 26.0–34.0)
MCHC: 29.9 g/dL — ABNORMAL LOW (ref 30.0–36.0)
Platelets: 297 10*3/uL (ref 150–400)
RDW: 15.3 % (ref 11.5–15.5)

## 2011-06-15 LAB — DIFFERENTIAL
Eosinophils Absolute: 0.1 10*3/uL (ref 0.0–0.7)
Lymphs Abs: 2 10*3/uL (ref 0.7–4.0)
Monocytes Absolute: 0.6 10*3/uL (ref 0.1–1.0)
Neutrophils Relative %: 73 % (ref 43–77)

## 2011-06-15 LAB — PREPARE RBC (CROSSMATCH)

## 2011-06-15 MED ORDER — SODIUM CHLORIDE 0.9 % IV SOLN
500.0000 mg | Freq: Once | INTRAVENOUS | Status: AC
  Administered 2011-06-16: 500 mg via INTRAVENOUS
  Filled 2011-06-15: qty 10

## 2011-06-15 MED ORDER — SODIUM CHLORIDE 0.9 % IV SOLN
INTRAVENOUS | Status: DC
  Administered 2011-06-15 – 2011-06-18 (×5): via INTRAVENOUS
  Administered 2011-06-19: 10 mL/h via INTRAVENOUS
  Administered 2011-06-20: 09:00:00 via INTRAVENOUS

## 2011-06-15 MED ORDER — BENAZEPRIL HCL 40 MG PO TABS
40.0000 mg | ORAL_TABLET | Freq: Every day | ORAL | Status: DC
  Administered 2011-06-15 – 2011-06-21 (×7): 40 mg via ORAL
  Filled 2011-06-15 (×9): qty 1

## 2011-06-15 MED ORDER — HYDROMORPHONE HCL PF 1 MG/ML IJ SOLN
2.0000 mg | INTRAMUSCULAR | Status: DC | PRN
  Administered 2011-06-15 – 2011-06-18 (×24): 2 mg via INTRAVENOUS
  Filled 2011-06-15 (×9): qty 2
  Filled 2011-06-15: qty 1
  Filled 2011-06-15 (×13): qty 2
  Filled 2011-06-15: qty 1
  Filled 2011-06-15: qty 2

## 2011-06-15 NOTE — Progress Notes (Signed)
CRITICAL VALUE ALERT  Critical value received:  Hgb 6.7  Date of notification:  06/15/2011  Time of notification:  0600  Critical value read back:yes  Nurse who received alert:  Milbern Doescher, Ok Edwards RN  MD notified (1st page):  Elray Mcgregor NP  Time of first page:  0610  MD notified (2nd page):  Time of second page:  Responding MD:  Elray Mcgregor NP  Time MD responded:  0615 orders entered into [facility]

## 2011-06-15 NOTE — Progress Notes (Signed)
Administered test dose of iron; no reaction 1 hour later. Will continue to monitor. Salaya Holtrop, Ok Edwards RN

## 2011-06-15 NOTE — Progress Notes (Signed)
PT Cancellation Note  Treatment cancelled today due to medical issues with patient which prohibited therapy. Pt still on strict bedrest and has pain 9/10. Waiting surgery tomorrow.  Will check on pt Monday am, unless needed prior.  If so , please page [place] at [phone] (weekend pager)  Marella Bile 06/15/2011, 4:08 PM

## 2011-06-15 NOTE — Progress Notes (Signed)
Subjective: Patient states that his pain is still not adequately controlled and it is a 9/10. Pt and wife have spoken with Careers adviser. Pt is scheduled for surgical debridement tomorrow. Pt is presently receiving transfusion of PRBC's, and received 1st dose of IV iron yesterday.   Objective: Filed Vitals:   06/15/11 1148 06/15/11 1248 06/15/11 1348 06/15/11 1427  BP: 154/95 162/76 148/77 149/72  Pulse: 62 81 70 67  Temp: 98.3 F (36.8 C) 97.4 F (36.3 C) 97.9 F (36.6 C) 98.1 F (36.7 C)  TempSrc: Oral Oral Oral Oral  Resp: 20 22 20 18   Height:      Weight:      SpO2:       Weight change: 1.191 kg (2 lb 10 oz)  Intake/Output Summary (Last 24 hours) at 06/15/11 1452 Last data filed at 06/15/11 1412  Gross per 24 hour  Intake   1275 ml  Output   1975 ml  Net   -700 ml    General: Alert, awake, oriented x3, in no acute distress.  HEENT: Interior/AT PEERL, EOMI Neck: Trachea midline,  no masses, no thyromegal,y no JVD, no carotid bruit Oropharynx:  Moist, No exudate/ erythema/lesions.  Heart: Regular rate and rhythm, without murmurs, rubs, gallops, PMI non-displaced, no heaves or thrills on palpation.  Lungs: Clear to auscultation, no wheezing or rhonchi noted. No increased vocal fremitus resonant to percussion  Abdomen: Soft, nontender, nondistended, positive bowel sounds, no masses no hepatosplenomegaly noted..  Neuro: No focal neurological deficits noted cranial nerves II through XII grossly intact. DTRs 2+ bilaterally upper and lower extremities. Strength 5/5 in bilateral upper extremities. Pt is paraplegic. Musculoskeletal: No warm swelling or erythema around joints, no spinal tenderness noted. Skin: Large stage IV decubitus ulcer at the sacral region. Patient also has a stage IV pressure ulcer on the left heel.  Lab Results:  [facility] 06/15/11 0520 06/14/11 0449  NA 136 133*  K 3.6 3.7  CL 101 100  CO2 31 27  GLUCOSE 90 99  BUN 4* 6  CREATININE 0.56 0.49*  CALCIUM 8.2* 8.5    MG -- --  PHOS -- --    Basename 06/13/11 1647  AST 19  ALT 17  ALKPHOS 143*  BILITOT 0.1*  PROT 6.7  ALBUMIN 2.1*   No results found for this basename: LIPASE:2,AMYLASE:2 in the last 72 hours  Basename 06/15/11 0520 06/14/11 0449 06/13/11 1647  WBC 10.1 11.5* --  NEUTROABS 7.4 -- 13.3*  HGB 6.7* 7.5* --  HCT 22.4* 24.7* --  MCV 80.3 79.4 --  PLT 297 300 --   No results found for this basename: CKTOTAL:3,CKMB:3,CKMBINDEX:3,TROPONINI:3 in the last 72 hours No components found with this basename: POCBNP:3 No results found for this basename: DDIMER:2 in the last 72 hours No results found for this basename: HGBA1C:2 in the last 72 hours No results found for this basename: CHOL:2,HDL:2,LDLCALC:2,TRIG:2,CHOLHDL:2,LDLDIRECT:2 in the last 72 hours  Basename 06/13/11 2349  TSH 1.087  T4TOTAL --  T3FREE --  THYROIDAB --    Basename 06/13/11 2349  VITAMINB12 1079*  FOLATE --  FERRITIN 212  TIBC Not calculated due to Iron <10.  IRON <10*  RETICCTPCT --    Micro Results: Recent Results (from the past 240 hour(s))  CULTURE, BLOOD (ROUTINE X 2)     Status: Normal (Preliminary result)   Collection Time   06/13/11  4:47 PM      Component Value Range Status Comment   Specimen Description BLOOD RIGHT ARM  Final    Special Requests BOTTLES DRAWN AEROBIC AND ANAEROBIC   Final    Culture  Setup Time [id number]   Final    Culture     Final    Value:        BLOOD CULTURE RECEIVED NO GROWTH TO DATE CULTURE WILL BE HELD FOR 5 DAYS BEFORE ISSUING A FINAL NEGATIVE REPORT   Report Status PENDING   Incomplete   CULTURE, BLOOD (ROUTINE X 2)     Status: Normal (Preliminary result)   Collection Time   06/13/11  4:48 PM      Component Value Range Status Comment   Specimen Description BLOOD LEFT ARM   Final    Special Requests BOTTLES DRAWN AEROBIC AND ANAEROBIC   Final    Culture  Setup Time [id number]   Final    Culture     Final    Value:        BLOOD CULTURE RECEIVED NO  GROWTH TO DATE CULTURE WILL BE HELD FOR 5 DAYS BEFORE ISSUING A FINAL NEGATIVE REPORT   Report Status PENDING   Incomplete   WOUND CULTURE     Status: Normal (Preliminary result)   Collection Time   06/13/11  4:48 PM      Component Value Range Status Comment   Specimen Description PERIRECTAL   Final    Special Requests NONE   Final    Gram Stain     Final    Value: NO WBC SEEN     NO SQUAMOUS EPITHELIAL CELLS SEEN     RARE GRAM POSITIVE COCCI IN PAIRS     RARE GRAM NEGATIVE RODS   Culture Culture reincubated for better growth   Final    Report Status PENDING   Incomplete   URINE CULTURE     Status: Normal (Preliminary result)   Collection Time   06/13/11  5:06 PM      Component Value Range Status Comment   Specimen Description URINE, CATHETERIZED   Final    Special Requests NONE   Final    Culture  Setup Time [id number]   Final    Colony Count 4,000 COLONIES/ML   Final    Culture GRAM NEGATIVE RODS   Final    Report Status PENDING   Incomplete     Studies/Results: Ct Abdomen Pelvis W Contrast  06/13/2011  *RADIOLOGY REPORT*  Clinical Data: Bleeding sacral decubitus ulcer.  CT ABDOMEN AND PELVIS WITH CONTRAST  Technique:  Multidetector CT imaging of the abdomen and pelvis was performed following the standard protocol during bolus administration of intravenous contrast.  Contrast: OMNIPAQUE IOHEXOL 300 MG/ML IJ SOLN  Comparison: CT scan dated 02/18/2005  Findings: The liver, spleen, adrenal glands, and left kidney are normal.  Gallbladder has been removed.  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  There is an 11.6 cm simple appearing cyst in the lateral aspect of the right kidney.  The bowel appears normal. Foley catheter is in place in the bladder.  There is a prominent sacral decubitus ulcer in the midline.  There is adjacent cellulitis.  Air is seen in the soft tissues in the right buttock adjacent to the gluteal crease consistent with  infection in the soft tissues.  There is also air in the subcutaneous fat of the perineum on images 97 through 99.  There is an abscess which tracks into the right buttock superficial to the  right gluteus medius muscle.  There is air and a tiny amount of pus within this abscess. This appears to extend to the sacral decubitus ulcer.  Chronic sclerosis of the posterior aspect of the sacrum without discrete osteomyelitis.  IMPRESSION:  1.  Prominent sacral decubitus ulcer.  There is an abscess was extends from the right side of the sacral decubitus ulcer into the right buttock with a small amount of air and pus within the abscess.  This is approximately 10 cm in length with only a 18 mm in diameter. 2. No acute intra-abdominal abnormality.  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 These are usually not aggressive lesions.  Original Report Authenticated By: Gwynn Burly, M.D.   Dg Chest Port 1 View  06/13/2011  *RADIOLOGY REPORT*  Clinical Data: Shortness of breath and cough.  PORTABLE CHEST - 1 VIEW  Comparison: 02/09/2008  Findings: Heart size and vascularity are normal and the lungs are clear.  No osseous abnormality.  IMPRESSION: Normal chest.  Original Report Authenticated By: Gwynn Burly, M.D.    Medications: I have reviewed the patient's current medications. Scheduled Meds:    . benazepril  40 mg Oral Daily  . carisoprodol  700 mg Oral Q6H  . chlorhexidine  1 application Topical Once  . chlorhexidine  1 application Topical Once  . ferrous sulfate  650 mg Oral Daily  . fluticasone  2 spray Each Nare Daily  . iron dextran (INFED/DEXFERRUM) IVPB (TEST DOSE)  25 mg Intravenous Once  . iron dextran (INFED/DEXFERRRUM) 500 MG IVPB  500 mg Intravenous Once  . loratadine  10 mg Oral Daily  . mulitivitamin  5 mL Oral Daily  . piperacillin-tazobactam (ZOSYN)  IV  3.375 g Intravenous Q8H  . pyridOXINE  50 mg Oral Daily  . sodium  chloride  3 mL Intravenous Q12H  . vancomycin  1,250 mg Intravenous Q12H  . DISCONTD: iron dextran (INFED/DEXFERRRUM) 500 MG IVPB  500 mg Intravenous Daily   Continuous Infusions:    . sodium chloride 75 mL/hr at 06/15/11 0924  . DISCONTD: 0.9 % NaCl with KCl 20 mEq / L 100 mL/hr at 06/15/11 0146   PRN Meds:.albuterol, diazepam, HYDROmorphone, ondansetron (ZOFRAN) IV, ondansetron, oxyCODONE, oxyCODONE-acetaminophen, DISCONTD: HYDROmorphone Assessment/Plan: Patient Active Hospital Problem List: Decubitus ulcer of buttock, stage 4 (06/13/2011)   Assessment: Pt will be seen by Surgery and evaluated for surgical debridement. Pt is scheduled for surgical debridement tomorrow. Will continue vancomycin and Zosyn and local wound care. I will also increase his dilaudid to 2mg  based on pt's weight and pain control.    UTI (lower urinary tract infection) (06/13/2011)   Assessment: Urine culture shows only 4,000 colonies of GNR. This is not diagniostic of a UTI.  Anemia (06/13/2011)   Assessment: Serum iron less than 10. IV iron initiated.    Hyponatremia (06/13/2011)   Assessment: This is improved with IV fluids.    Paraplegia following spinal cord injury (06/13/2011)   Assessment: Chronic condition contributing to his decubitus ulcers.    Malnourished (06/13/2011)   Assessment: A dietary consult.     LOS: 2 days

## 2011-06-15 NOTE — Progress Notes (Signed)
Subjective: Feel weak.   Objective: Vital signs in last 24 hours: Temp:  [97.8 F (36.6 C)-99 F (37.2 C)] 97.8 F (36.6 C) (03/09 1000) Pulse Rate:  [62-76] 62  (03/09 1000) Resp:  [20] 20  (03/09 0456) BP: (160-174)/(70-76) 162/74 mmHg (03/09 1000) SpO2:  [94 %-98 %] 97 % (03/09 1000) Weight:  [309 lb 8.4 oz (140.4 kg)] 309 lb 8.4 oz (140.4 kg) (03/09 0456) Last BM Date: 06/14/11  Intake/Output from previous day: 03/08 0701 - 03/09 0700 In: 2296.7 [P.O.:480; I.V.:1256.7; IV Piggyback:560] Out: 2075 [Urine:2075] Intake/Output this shift: Total I/O In: -  Out: 700 [Urine:700]  Pale, nad Sacral dressing - c/d/i - no blood soaked thru bandage.  Lab Results:   Basename 06/15/11 0520 06/14/11 0449  WBC 10.1 11.5*  HGB 6.7* 7.5*  HCT 22.4* 24.7*  PLT 297 300   BMET  Basename 06/15/11 0520 06/14/11 0449  NA 136 133*  K 3.6 3.7  CL 101 100  CO2 31 27  GLUCOSE 90 99  BUN 4* 6  CREATININE 0.56 0.49*  CALCIUM 8.2* 8.5   PT/INR No results found for this basename: LABPROT:2,INR:2 in the last 72 hours ABG No results found for this basename: PHART:2,PCO2:2,PO2:2,HCO3:2 in the last 72 hours  Studies/Results: Ct Abdomen Pelvis W Contrast  06/13/2011  *RADIOLOGY REPORT*  Clinical Data: Bleeding sacral decubitus ulcer.  CT ABDOMEN AND PELVIS WITH CONTRAST  Technique:  Multidetector CT imaging of the abdomen and pelvis was performed following the standard protocol during bolus administration of intravenous contrast.  Contrast: OMNIPAQUE IOHEXOL 300 MG/ML IJ SOLN  Comparison: CT scan dated 02/18/2005  Findings: The liver, spleen, adrenal glands, and left kidney are normal.  Gallbladder has been removed.  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  There is an 11.6 cm simple appearing cyst in the lateral aspect of the right kidney.  The bowel appears normal. Foley catheter is in place in the bladder.  There is a prominent sacral  decubitus ulcer in the midline.  There is adjacent cellulitis.  Air is seen in the soft tissues in the right buttock adjacent to the gluteal crease consistent with infection in the soft tissues.  There is also air in the subcutaneous fat of the perineum on images 97 through 99.  There is an abscess which tracks into the right buttock superficial to the right gluteus medius muscle.  There is air and a tiny amount of pus within this abscess. This appears to extend to the sacral decubitus ulcer.  Chronic sclerosis of the posterior aspect of the sacrum without discrete osteomyelitis.  IMPRESSION:  1.  Prominent sacral decubitus ulcer.  There is an abscess was extends from the right side of the sacral decubitus ulcer into the right buttock with a small amount of air and pus within the abscess.  This is approximately 10 cm in length with only a 18 mm in diameter. 2. No acute intra-abdominal abnormality.  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 These are usually not aggressive lesions.  Original Report Authenticated By: Gwynn Burly, M.D.   Dg Chest Port 1 View  06/13/2011  *RADIOLOGY REPORT*  Clinical Data: Shortness of breath and cough.  PORTABLE CHEST - 1 VIEW  Comparison: 02/09/2008  Findings: Heart size and vascularity are normal and the lungs are clear.  No osseous abnormality.  IMPRESSION: Normal chest.  Original  Report Authenticated By: Gwynn Burly, M.D.    Anti-infectives: Anti-infectives     Start     Dose/Rate Route Frequency Ordered Stop   06/14/11 1200   vancomycin (VANCOCIN) 1,250 mg in sodium chloride 0.9 % 250 mL IVPB        1,250 mg 166.7 mL/hr over 90 Minutes Intravenous Every 12 hours 06/14/11 0334     06/14/11 0000   vancomycin (VANCOCIN) 1,500 mg in sodium chloride 0.9 % 500 mL IVPB  Status:  Discontinued        1,500 mg 250 mL/hr over 120 Minutes Intravenous Every 12 hours 06/13/11 2313 06/14/11 0334   06/13/11 2315    piperacillin-tazobactam (ZOSYN) IVPB 3.375 g        3.375 g 12.5 mL/hr over 240 Minutes Intravenous 3 times per day 06/13/11 2313     06/13/11 1700   vancomycin (VANCOCIN) IVPB 1000 mg/200 mL premix        1,000 mg 200 mL/hr over 60 Minutes Intravenous To Emergency Dept 06/13/11 1549 06/13/11 1745   06/13/11 1700   piperacillin-tazobactam (ZOSYN) IVPB 3.375 g        3.375 g 100 mL/hr over 30 Minutes Intravenous To Emergency Dept 06/13/11 1549 06/13/11 1714          Assessment/Plan: s/p * No surgery found * hgb down to 6.7  Will postpone debridement since not urgent and pt needs transfusion prior to going to OR.  Tentative debridement in AM. Npo p mn Explained to pt and wife that if other more urgent cases come in his surgery will be delayed to Monday  [name]. Andrey Campanile, MD, FACS General, Bariatric, & Minimally Invasive Surgery [facility], [place]    LOS: 2 days    [name] 06/15/2011

## 2011-06-16 ENCOUNTER — Encounter (HOSPITAL_COMMUNITY): Payer: Self-pay | Admitting: Anesthesiology

## 2011-06-16 LAB — URINE CULTURE
Colony Count: 4000
Culture  Setup Time: 201303080335

## 2011-06-16 LAB — CBC
MCHC: 30.6 g/dL (ref 30.0–36.0)
MCV: 79.9 fL (ref 78.0–100.0)
Platelets: 311 10*3/uL (ref 150–400)
RDW: 15.3 % (ref 11.5–15.5)
WBC: 10.3 10*3/uL (ref 4.0–10.5)

## 2011-06-16 LAB — DIFFERENTIAL
Basophils Absolute: 0 10*3/uL (ref 0.0–0.1)
Basophils Relative: 0 % (ref 0–1)
Eosinophils Relative: 2 % (ref 0–5)
Lymphocytes Relative: 17 % (ref 12–46)
Monocytes Absolute: 0.6 10*3/uL (ref 0.1–1.0)
Neutro Abs: 7.7 10*3/uL (ref 1.7–7.7)

## 2011-06-16 LAB — WOUND CULTURE
Culture: NORMAL
Gram Stain: NONE SEEN

## 2011-06-16 LAB — TYPE AND SCREEN
ABO/RH(D): A POS
Antibody Screen: NEGATIVE
Unit division: 0

## 2011-06-16 LAB — BASIC METABOLIC PANEL
BUN: 3 mg/dL — ABNORMAL LOW (ref 6–23)
Chloride: 101 mEq/L (ref 96–112)
Glucose, Bld: 92 mg/dL (ref 70–99)
Potassium: 3.4 mEq/L — ABNORMAL LOW (ref 3.5–5.1)
Sodium: 135 mEq/L (ref 135–145)

## 2011-06-16 MED ORDER — PRO-STAT SUGAR FREE PO LIQD
30.0000 mL | Freq: Three times a day (TID) | ORAL | Status: DC
  Administered 2011-06-16 – 2011-06-21 (×9): 30 mL via ORAL
  Filled 2011-06-16 (×19): qty 30

## 2011-06-16 MED ORDER — CARISOPRODOL 350 MG PO TABS
700.0000 mg | ORAL_TABLET | Freq: Four times a day (QID) | ORAL | Status: DC
  Administered 2011-06-16 – 2011-06-21 (×19): 700 mg via ORAL
  Filled 2011-06-16 (×2): qty 2
  Filled 2011-06-16: qty 1
  Filled 2011-06-16: qty 2
  Filled 2011-06-16: qty 1
  Filled 2011-06-16 (×9): qty 2
  Filled 2011-06-16 (×2): qty 1
  Filled 2011-06-16 (×5): qty 2

## 2011-06-16 NOTE — Anesthesia Preprocedure Evaluation (Addendum)
Anesthesia Evaluation  Patient identified by MRN, date of birth, ID band Patient awake  General Assessment Comment:61 y.o with h/o partial paraplegia from myelogram procedure about 4 years ago with a chronic decubitus wound that is bleeding more than normal.  Reviewed: Allergy & Precautions, H&P , NPO status , Patient's Chart, lab work & pertinent test results  Airway Mallampati: II TM Distance: >3 FB Neck ROM: Limited    Dental   Missing some teeth. Denies loose teeth. Decreased ROM neck.:   Pulmonary neg pulmonary ROS,  CXR: Normal (06/13/11) breath sounds clear to auscultation  Pulmonary exam normal       Cardiovascular hypertension, Pt. on medications + CAD Rhythm:Regular Rate:Normal  ECG: Sinus rhythm at 88. Echo (2006): EF normal at 55-65%. Anemia. Hgb = 7.9. Received 2 units pRBCs yesterday.  Cardiolite (2006): Negative for ischemia. EF 51%. Cardiac Cath (2001): Non-obstructive CAD.   Neuro/Psych Note results of cervical CT in 2007: severe stenosis of C3-C7  Paraplegia negative psych ROS   GI/Hepatic negative GI ROS, Neg liver ROS,   Endo/Other  negative endocrine ROS  Renal/GU negative Renal ROS  negative genitourinary   Musculoskeletal negative musculoskeletal ROS (+)   Abdominal (+) + obese,   Peds negative pediatric ROS (+)  Hematology Anemia, Hgb 8.2   Anesthesia Other Findings   Reproductive/Obstetrics negative OB ROS                      Anesthesia Physical Anesthesia Plan  ASA: III  Anesthesia Plan: General   Post-op Pain Management:    Induction: Intravenous  Airway Management Planned: Oral ETT  Additional Equipment:   Intra-op Plan:   Post-operative Plan: Extubation in OR  Informed Consent: I have reviewed the patients History and Physical, chart, labs and discussed the procedure including the risks, benefits and alternatives for the proposed anesthesia with the  patient or authorized representative who has indicated his/her understanding and acceptance.   Dental advisory given  Plan Discussed with: CRNA  Anesthesia Plan Comments: (Dr. Andrey Campanile informs that patient will be in prone position for procedure. Mr. Maines is very concerned about his neck and back during this procedure.  He does have some limited ROM of his neck. He occasionally gets tingling in fingers. Obviously, airway management must be careful.  He is really only comfortable in the "Williams" position with flexion at the hips and knees. He cannot lay flat without excruciating pain in back. Attempts will be made to turn him and keep him prone while maintaining this hip flexion.)       Anesthesia Quick Evaluation

## 2011-06-16 NOTE — Progress Notes (Signed)
Per Surgeon, surgery will be done on Monday rather than today.  Patient can resume diet and npo after midnight tonight.

## 2011-06-16 NOTE — Progress Notes (Signed)
INITIAL ADULT NUTRITION ASSESSMENT Date: 06/16/2011   Time: 12:39 PM  Reason for Assessment: Consult, wound healing  ASSESSMENT: Male 61 y.o.  Dx: Decubitus ulcer of buttock, stage 4  Hx:  Past Medical History  Diagnosis Date  . Hypertension   . Coronary artery disease    Related Meds:     . benazepril  40 mg Oral Daily  . carisoprodol  700 mg Oral Q6H  . chlorhexidine  1 application Topical Once  . ferrous sulfate  650 mg Oral Daily  . fluticasone  2 spray Each Nare Daily  . iron dextran (INFED/DEXFERRRUM) 500 MG IVPB  500 mg Intravenous Once  . loratadine  10 mg Oral Daily  . mulitivitamin  5 mL Oral Daily  . piperacillin-tazobactam (ZOSYN)  IV  3.375 g Intravenous Q8H  . pyridOXINE  50 mg Oral Daily  . sodium chloride  3 mL Intravenous Q12H  . vancomycin  1,250 mg Intravenous Q12H   Ht: 6\' 7"  (200.7 cm)  Wt: 311 lb 4.6 oz (141.2 kg)  Ideal Wt: 100 kg % Ideal Wt: 141%  Usual Wt: Unknown % Usual Wt: Unknown  Body mass index is 35.07 kg/(m^2), obese  Food/Nutrition Related Hx: Patient reports he has a good appetite with 100% meal completion. He has multiple pressure wounds (stage 4 sacrum, buttock, right heel). He reports that he drinks 1-2 [employer] protein drinks at home which provide 30 grams of protein per shake. He is aware of the need to increase dietary protein. He is scheduled for surgical debridement tomorrow.   Labs:  CMP     Component Value Date/Time   NA 135 06/16/2011 0445   K 3.4* 06/16/2011 0445   CL 101 06/16/2011 0445   CO2 30 06/16/2011 0445   GLUCOSE 92 06/16/2011 0445   BUN 3* 06/16/2011 0445   CREATININE 0.47* 06/16/2011 0445   CALCIUM 8.0* 06/16/2011 0445   PROT 6.7 06/13/2011 1647   ALBUMIN 2.1* 06/13/2011 1647   AST 19 06/13/2011 1647   ALT 17 06/13/2011 1647   ALKPHOS 143* 06/13/2011 1647   BILITOT 0.1* 06/13/2011 1647   GFRNONAA >90 06/16/2011 0445   GFRAA >90 06/16/2011 0445   Prealbumin  4*   Intake/Output Summary (Last 24 hours) at 06/16/11  1243 Last data filed at 06/16/11 1100  Gross per 24 hour  Intake 1982.5 ml  Output   1850 ml  Net  132.5 ml    Diet Order: Regular  Supplements/Tube Feeding: None  IVF:    sodium chloride Last Rate: 75 mL/hr at 06/16/11 0511    Estimated Nutritional Needs:   Kcal: 2375-2600 kcal Protein: 140-160 g Fluid: 4 L  NUTRITION DIAGNOSIS: -Increased protein needs (NI-5.1).  Status: Ongoing  RELATED TO: wound healing  AS EVIDENCE BY: presence of stage 4 sacrum and unstageable buttock and right heel wounds.   MONITORING/EVALUATION(Goals): Patient will meet 90-100% of estimated nutrition needs to promote wound healing.   Monitor: PO intake, wound healing, labs.   EDUCATION NEEDS: -Education needs addressed  INTERVENTION: 1. 30 mL ProStat TID to provide an additional 45 g protein.  2. Briefly reviewed protein sources and pt advised to consume protein at all meals and snacks.    DOCUMENTATION CODES Per approved criteria  -Obesity Unspecified    [name] 06/16/2011, 12:39 PM

## 2011-06-16 NOTE — Progress Notes (Signed)
Subjective: Patient states that his pain is better but  still not adequately controlled. Pt is requesting that his Soma and Valium and Percocet be administered simultaneously.  Pt will speak with Pharmacist.    Objective: Filed Vitals:   06/15/11 2108 06/16/11 0545 06/16/11 1451 06/16/11 1553  BP: 177/73 158/77 173/71 176/85  Pulse: 67 64 84   Temp: 98.2 F (36.8 C) 98.3 F (36.8 C) 97.3 F (36.3 C)   TempSrc: Oral Oral Oral   Resp: 20 20 20    Height:      Weight:  141.2 kg (311 lb 4.6 oz)    SpO2: 95% 93% 95%    Weight change: 0.8 kg (1 lb 12.2 oz)  Intake/Output Summary (Last 24 hours) at 06/16/11 1724 Last data filed at 06/16/11 1500  Gross per 24 hour  Intake   1020 ml  Output   2751 ml  Net  -1731 ml    General: Alert, awake, oriented x3, in no acute distress.  HEENT: [place]/AT [place]ER[place], EOMI Neck: Trachea midline,  no masses, no thyromegal,y no JVD, no carotid bruit Oropharynx:  Moist, No exudate/ erythema/lesions.  Heart: Regular rate and rhythm, without murmurs, rubs, gallops, PMI non-displaced, no heaves or thrills on palpation.  Lungs: Clear to auscultation, no wheezing or rhonchi noted. No increased vocal fremitus resonant to percussion  Abdomen: Soft, nontender, nondistended, positive bowel sounds, no masses no hepatosplenomegaly noted..  Neuro: No focal neurological deficits noted cranial nerves II through XII grossly intact. DTRs 2+ bilaterally upper and lower extremities. Strength 5/5 in bilateral upper extremities. Pt is paraplegic. Musculoskeletal: No warm swelling or erythema around joints, no spinal tenderness noted. Skin: Large stage IV decubitus ulcer at the sacral region. Patient also has a stage IV pressure ulcer on the left heel.  Lab Results:  Basename 06/16/11 0445 06/15/11 0520  NA 135 136  K 3.4* 3.6  CL 101 101  CO2 30 31  GLUCOSE 92 90  BUN 3* 4*  CREATININE 0.47* 0.56  CALCIUM 8.0* 8.2*  MG -- --  PHOS -- --   No results found for this  basename: AST:2,ALT:2,ALKPHOS:2,BILITOT:2,PROT:2,ALBUMIN:2 in the last 72 hours No results found for this basename: LIPASE:2,AMYLASE:2 in the last 72 hours  Basename 06/16/11 0445 06/15/11 0520  WBC 10.3 10.1  NEUTROABS 7.7 7.4  HGB 7.8* 6.7*  HCT 25.5* 22.4*  MCV 79.9 80.3  PLT 311 297   No results found for this basename: CKTOTAL:3,CKMB:3,CKMBINDEX:3,TROPONINI:3 in the last 72 hours No components found with this basename: POCBNP:3 No results found for this basename: DDIMER:2 in the last 72 hours No results found for this basename: HGBA1C:2 in the last 72 hours No results found for this basename: CHOL:2,HDL:2,LDLCALC:2,TRIG:2,CHOLHDL:2,LDLDIRECT:2 in the last 72 hours  Basename 06/13/11 2349  TSH 1.087  T4TOTAL --  T3FREE --  THYROIDAB --    Basename 06/13/11 2349  VITAMINB12 1079*  FOLATE --  FERRITIN 212  TIBC Not calculated due to Iron <10.  IRON <10*  RETICCTPCT --    Micro Results: Recent Results (from the past 240 hour(s))  CULTURE, BLOOD (ROUTINE X 2)     Status: Normal (Preliminary result)   Collection Time   06/13/11  4:47 PM      Component Value Range Status Comment   Specimen Description BLOOD RIGHT ARM   Final    Special Requests BOTTLES DRAWN AEROBIC AND ANAEROBIC   Final    Culture  Setup Time [id number]   Final    Culture  Final    Value:        BLOOD CULTURE RECEIVED NO GROWTH TO DATE CULTURE WILL BE HELD FOR 5 DAYS BEFORE ISSUING A FINAL NEGATIVE REPORT   Report Status PENDING   Incomplete   CULTURE, BLOOD (ROUTINE X 2)     Status: Normal (Preliminary result)   Collection Time   06/13/11  4:48 PM      Component Value Range Status Comment   Specimen Description BLOOD LEFT ARM   Final    Special Requests BOTTLES DRAWN AEROBIC AND ANAEROBIC   Final    Culture  Setup Time [id number]   Final    Culture     Final    Value:        BLOOD CULTURE RECEIVED NO GROWTH TO DATE CULTURE WILL BE HELD FOR 5 DAYS BEFORE ISSUING A FINAL NEGATIVE REPORT    Report Status PENDING   Incomplete   WOUND CULTURE     Status: Normal   Collection Time   06/13/11  4:48 PM      Component Value Range Status Comment   Specimen Description PERIRECTAL   Final    Special Requests NONE   Final    Gram Stain     Final    Value: NO WBC SEEN     NO SQUAMOUS EPITHELIAL CELLS SEEN     RARE GRAM POSITIVE COCCI IN PAIRS     RARE GRAM NEGATIVE RODS   Culture     Final    Value: Multiple Species Consistent With Normal Fecal Flora   Report Status 06/16/2011 FINAL   Final   URINE CULTURE     Status: Normal   Collection Time   06/13/11  5:06 PM      Component Value Range Status Comment   Specimen Description URINE, CATHETERIZED   Final    Special Requests NONE   Final    Culture  Setup Time [id number]   Final    Colony Count 4,000 COLONIES/ML   Final    Culture KLEBSIELLA PNEUMONIAE   Final    Report Status 06/16/2011 FINAL   Final    Organism ID, Bacteria KLEBSIELLA PNEUMONIAE   Final     Studies/Results: Ct Abdomen Pelvis W Contrast  06/13/2011  *RADIOLOGY REPORT*  Clinical Data: Bleeding sacral decubitus ulcer.  CT ABDOMEN AND PELVIS WITH CONTRAST  Technique:  Multidetector CT imaging of the abdomen and pelvis was performed following the standard protocol during bolus administration of intravenous contrast.  Contrast: OMNIPAQUE IOHEXOL 300 MG/ML IJ SOLN  Comparison: CT scan dated 02/18/2005  Findings: The liver, spleen, adrenal glands, and left kidney are normal.  Gallbladder has been removed.  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  There is an 11.6 cm simple appearing cyst in the lateral aspect of the right kidney.  The bowel appears normal. Foley catheter is in place in the bladder.  There is a prominent sacral decubitus ulcer in the midline.  There is adjacent cellulitis.  Air is seen in the soft tissues in the right buttock adjacent to the gluteal crease consistent with infection in the soft tissues.   There is also air in the subcutaneous fat of the perineum on images 97 through 99.  There is an abscess which tracks into the right buttock superficial to the right gluteus medius muscle.  There is air and a tiny amount of pus within this abscess. This appears  to extend to the sacral decubitus ulcer.  Chronic sclerosis of the posterior aspect of the sacrum without discrete osteomyelitis.  IMPRESSION:  1.  Prominent sacral decubitus ulcer.  There is an abscess was extends from the right side of the sacral decubitus ulcer into the right buttock with a small amount of air and pus within the abscess.  This is approximately 10 cm in length with only a 18 mm in diameter. 2. No acute intra-abdominal abnormality.  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 These are usually not aggressive lesions.  Original Report Authenticated By: Gwynn Burly, M.D.   Dg Chest Port 1 View  06/13/2011  *RADIOLOGY REPORT*  Clinical Data: Shortness of breath and cough.  PORTABLE CHEST - 1 VIEW  Comparison: 02/09/2008  Findings: Heart size and vascularity are normal and the lungs are clear.  No osseous abnormality.  IMPRESSION: Normal chest.  Original Report Authenticated By: Gwynn Burly, M.D.    Medications: I have reviewed the patient's current medications. Scheduled Meds:    . benazepril  40 mg Oral Daily  . carisoprodol  700 mg Oral QID  . chlorhexidine  1 application Topical Once  . feeding supplement  30 mL Oral TID WC  . ferrous sulfate  650 mg Oral Daily  . fluticasone  2 spray Each Nare Daily  . iron dextran (INFED/DEXFERRRUM) 500 MG IVPB  500 mg Intravenous Once  . loratadine  10 mg Oral Daily  . mulitivitamin  5 mL Oral Daily  . piperacillin-tazobactam (ZOSYN)  IV  3.375 g Intravenous Q8H  . pyridOXINE  50 mg Oral Daily  . sodium chloride  3 mL Intravenous Q12H  . vancomycin  1,250 mg Intravenous Q12H  . DISCONTD: carisoprodol  700 mg Oral Q6H     Continuous Infusions:    . sodium chloride 75 mL/hr at 06/16/11 1531   PRN Meds:.albuterol, diazepam, HYDROmorphone, ondansetron (ZOFRAN) IV, ondansetron, oxyCODONE, oxyCODONE-acetaminophen Assessment/Plan: Patient Active Hospital Problem List: Decubitus ulcer of buttock, stage 4 (06/13/2011)   Assessment: Pt will be seen by Surgery and evaluated for surgical debridement. Pt's debridement was postponed for today and is scheduled for surgical debridement tomorrow. Will continue vancomycin and Zosyn and local wound care.  Anemia (06/13/2011)   Assessment: Serum iron less than 10. IV iron initiated.    Hyponatremia (06/13/2011)   Assessment: This is improved with IV fluids.    Paraplegia following spinal cord injury (06/13/2011)   Assessment: Chronic condition contributing to his decubitus ulcers.    Malnourished (06/13/2011)   Assessment: A dietary consult.     LOS: 3 days

## 2011-06-16 NOTE — Progress Notes (Signed)
  Subjective: Still feels weak. Wonders if needs another unit of blood. Got 2 units of blood yesterday  Objective: Vital signs in last 24 hours: Temp:  [97.4 F (36.3 C)-98.3 F (36.8 C)] 98.3 F (36.8 C) (03/10 0545) Pulse Rate:  [57-81] 64  (03/10 0545) Resp:  [18-22] 20  (03/10 0545) BP: (148-177)/(72-95) 158/77 mmHg (03/10 0545) SpO2:  [93 %-96 %] 93 % (03/10 0545) Weight:  [311 lb 4.6 oz (141.2 kg)] 311 lb 4.6 oz (141.2 kg) (03/10 0545) Last BM Date: 06/15/11  Intake/Output from previous day: 03/09 0701 - 03/10 0700 In: 2398.3 [P.O.:200; I.V.:1123.3; Blood:725; IV Piggyback:350] Out: 1550 [Urine:1550] Intake/Output this shift:    Alert, nad cta ant Reg Sacrum - deferred  Lab Results:   Basename 06/16/11 0445 06/15/11 0520  WBC 10.3 10.1  HGB 7.8* 6.7*  HCT 25.5* 22.4*  PLT 311 297   BMET  Basename 06/16/11 0445 06/15/11 0520  NA 135 136  K 3.4* 3.6  CL 101 101  CO2 30 31  GLUCOSE 92 90  BUN 3* 4*  CREATININE 0.47* 0.56  CALCIUM 8.0* 8.2*   PT/INR No results found for this basename: LABPROT:2,INR:2 in the last 72 hours ABG No results found for this basename: PHART:2,PCO2:2,PO2:2,HCO3:2 in the last 72 hours  Studies/Results: No results found.  Anti-infectives: Anti-infectives     Start     Dose/Rate Route Frequency Ordered Stop   06/14/11 1200   vancomycin (VANCOCIN) 1,250 mg in sodium chloride 0.9 % 250 mL IVPB        1,250 mg 166.7 mL/hr over 90 Minutes Intravenous Every 12 hours 06/14/11 0334     06/14/11 0000   vancomycin (VANCOCIN) 1,500 mg in sodium chloride 0.9 % 500 mL IVPB  Status:  Discontinued        1,500 mg 250 mL/hr over 120 Minutes Intravenous Every 12 hours 06/13/11 2313 06/14/11 0334   06/13/11 2315   piperacillin-tazobactam (ZOSYN) IVPB 3.375 g        3.375 g 12.5 mL/hr over 240 Minutes Intravenous 3 times per day 06/13/11 2313     06/13/11 1700   vancomycin (VANCOCIN) IVPB 1000 mg/200 mL premix        1,000 mg 200 mL/hr  over 60 Minutes Intravenous To Emergency Dept 06/13/11 1549 06/13/11 1745   06/13/11 1700   piperacillin-tazobactam (ZOSYN) IVPB 3.375 g        3.375 g 100 mL/hr over 30 Minutes Intravenous To Emergency Dept 06/13/11 1549 06/13/11 1714          Assessment/Plan: Stage iv sacral decub  Didn't really respond appropriately to transfusion For adequate debridement in OR, pt will need to be placed in prone position. Due to limited staffing in OR today and pt's body habitus and spine issues, anesthesia prefers to delay surgery until Monday when there is a full complement of personnel to help position pt appropriately.   Regular diet Npo p MN Anesthesia preop consult today Recheck labs Monday.   Pt agrees with and is in total agreement with waiting for surgery for Monday.  If hgb drops below will need another transfusion  [name]. Andrey Campanile, MD, FACS General, Bariatric, & Minimally Invasive Surgery [facility], [place]     LOS: 3 days    [name] 06/16/2011

## 2011-06-16 NOTE — Progress Notes (Signed)
Patient ID: [name], male   DOB: [birth year], 61 y.o.   MRN: [id number] [facility] Surgery Progress Note:   * No surgery found *  Subjective: Mental status is alert.  Pt warning about his neck and need for stabilization during surgery.  Anesthesia consult as patient will need to be placed in prone position.  Will schedule for debridement tomorrow.  Objective: Vital signs in last 24 hours: Temp:  [97.4 F (36.3 C)-98.3 F (36.8 C)] 98.3 F (36.8 C) (03/10 0545) Pulse Rate:  [57-81] 64  (03/10 0545) Resp:  [18-22] 20  (03/10 0545) BP: (148-177)/(72-95) 158/77 mmHg (03/10 0545) SpO2:  [93 %-96 %] 93 % (03/10 0545) Weight:  [311 lb 4.6 oz (141.2 kg)] 311 lb 4.6 oz (141.2 kg) (03/10 0545)  Intake/Output from previous day: 03/09 0701 - 03/10 0700 In: 2398.3 [P.O.:200; I.V.:1123.3; Blood:725; IV Piggyback:350] Out: 1550 [Urine:1550] Intake/Output this shift:    Physical Exam: Decubitus not examined.   Lab Results:  Results for orders placed during the hospital encounter of 06/13/11 (from the past 48 hour(s))  CBC     Status: Abnormal   Collection Time   06/15/11  5:20 AM      Component Value Range Comment   WBC 10.1  4.0 - 10.5 (K/uL)    RBC 2.79 (*) 4.22 - 5.81 (MIL/uL)    Hemoglobin 6.7 (*) 13.0 - 17.0 (g/dL)    HCT 40.9 (*) 81.1 - 52.0 (%)    MCV 80.3  78.0 - 100.0 (fL)    MCH 24.0 (*) 26.0 - 34.0 (pg)    MCHC 29.9 (*) 30.0 - 36.0 (g/dL)    RDW 91.4  78.2 - 95.6 (%)    Platelets 297  150 - 400 (K/uL)   DIFFERENTIAL     Status: Normal   Collection Time   06/15/11  5:20 AM      Component Value Range Comment   Neutrophils Relative 73  43 - 77 (%)    Lymphocytes Relative 20  12 - 46 (%)    Monocytes Relative 6  3 - 12 (%)    Eosinophils Relative 1  0 - 5 (%)    Basophils Relative 0  0 - 1 (%)    Neutro Abs 7.4  1.7 - 7.7 (K/uL)    Lymphs Abs 2.0  0.7 - 4.0 (K/uL)    Monocytes Absolute 0.6  0.1 - 1.0 (K/uL)    Eosinophils Absolute 0.1  0.0 - 0.7 (K/uL)    Basophils  Absolute 0.0  0.0 - 0.1 (K/uL)    RBC Morphology POLYCHROMASIA PRESENT   OVALOCYTES  BASIC METABOLIC PANEL     Status: Abnormal   Collection Time   06/15/11  5:20 AM      Component Value Range Comment   Sodium 136  135 - 145 (mEq/L)    Potassium 3.6  3.5 - 5.1 (mEq/L)    Chloride 101  96 - 112 (mEq/L)    CO2 31  19 - 32 (mEq/L)    Glucose, Bld 90  70 - 99 (mg/dL)    BUN 4 (*) 6 - 23 (mg/dL)    Creatinine, Ser 2.13  0.50 - 1.35 (mg/dL)    Calcium 8.2 (*) 8.4 - 10.5 (mg/dL)    GFR calc non Af Amer >90  >90 (mL/min)    GFR calc Af Amer >90  >90 (mL/min)   PREPARE RBC (CROSSMATCH)     Status: Normal   Collection Time   06/15/11  6:30 AM      Component Value Range Comment   Order Confirmation ORDER PROCESSED BY BLOOD BANK     CBC     Status: Abnormal   Collection Time   06/16/11  4:45 AM      Component Value Range Comment   WBC 10.3  4.0 - 10.5 (K/uL)    RBC 3.19 (*) 4.22 - 5.81 (MIL/uL)    Hemoglobin 7.8 (*) 13.0 - 17.0 (g/dL)    HCT 16.1 (*) 09.6 - 52.0 (%)    MCV 79.9  78.0 - 100.0 (fL)    MCH 24.5 (*) 26.0 - 34.0 (pg)    MCHC 30.6  30.0 - 36.0 (g/dL)    RDW 04.5  40.9 - 81.1 (%)    Platelets 311  150 - 400 (K/uL)   DIFFERENTIAL     Status: Normal   Collection Time   06/16/11  4:45 AM      Component Value Range Comment   Neutrophils Relative 75  43 - 77 (%)    Neutro Abs 7.7  1.7 - 7.7 (K/uL)    Lymphocytes Relative 17  12 - 46 (%)    Lymphs Abs 1.8  0.7 - 4.0 (K/uL)    Monocytes Relative 6  3 - 12 (%)    Monocytes Absolute 0.6  0.1 - 1.0 (K/uL)    Eosinophils Relative 2  0 - 5 (%)    Eosinophils Absolute 0.2  0.0 - 0.7 (K/uL)    Basophils Relative 0  0 - 1 (%)    Basophils Absolute 0.0  0.0 - 0.1 (K/uL)   BASIC METABOLIC PANEL     Status: Abnormal   Collection Time   06/16/11  4:45 AM      Component Value Range Comment   Sodium 135  135 - 145 (mEq/L)    Potassium 3.4 (*) 3.5 - 5.1 (mEq/L)    Chloride 101  96 - 112 (mEq/L)    CO2 30  19 - 32 (mEq/L)    Glucose, Bld 92   70 - 99 (mg/dL)    BUN 3 (*) 6 - 23 (mg/dL)    Creatinine, Ser 9.14 (*) 0.50 - 1.35 (mg/dL)    Calcium 8.0 (*) 8.4 - 10.5 (mg/dL)    GFR calc non Af Amer >90  >90 (mL/min)    GFR calc Af Amer >90  >90 (mL/min)     Radiology/Results: No results found.  Anti-infectives: Anti-infectives     Start     Dose/Rate Route Frequency Ordered Stop   06/14/11 1200   vancomycin (VANCOCIN) 1,250 mg in sodium chloride 0.9 % 250 mL IVPB        1,250 mg 166.7 mL/hr over 90 Minutes Intravenous Every 12 hours 06/14/11 0334     06/14/11 0000   vancomycin (VANCOCIN) 1,500 mg in sodium chloride 0.9 % 500 mL IVPB  Status:  Discontinued        1,500 mg 250 mL/hr over 120 Minutes Intravenous Every 12 hours 06/13/11 2313 06/14/11 0334   06/13/11 2315  piperacillin-tazobactam (ZOSYN) IVPB 3.375 g       3.375 g 12.5 mL/hr over 240 Minutes Intravenous 3 times per day 06/13/11 2313     06/13/11 1700   vancomycin (VANCOCIN) IVPB 1000 mg/200 mL premix        1,000 mg 200 mL/hr over 60 Minutes Intravenous To Emergency Dept 06/13/11 1549 06/13/11 1745   06/13/11 1700  piperacillin-tazobactam (ZOSYN) IVPB 3.375 g  3.375 g 100 mL/hr over 30 Minutes Intravenous To Emergency Dept 06/13/11 1549 06/13/11 1714          Assessment/Plan: Problem List: Patient Active Problem List  Diagnoses  . Decubitus ulcer of buttock, stage 4  . Hyponatremia  . Paraplegia following spinal cord injury  . UTI (lower urinary tract infection)  . Anemia  . Malnourished    For debridement in  OR tomorrow.   * No surgery found *    LOS: 3 days   Matt B. Daphine Deutscher, MD, [facility] [phone] beeper [phone]  06/16/2011 10:29 AM

## 2011-06-16 NOTE — Progress Notes (Signed)
Asked by MD to speak with patient regarding administration times of Soma (carisoprodol) ordered 4 times per day.  He requests that this be given at the same time as percocet and valium which are ordered prn.  Based on evaluation of prn percocet and valium usage per [facility], have rescheduled Soma administration times to 10am, 2pm, 6pm, and 10pm.  RN to call Pharmacy if further adjustments needed.  Thank you, Loralee Pacas, PharmD, BCPS

## 2011-06-17 ENCOUNTER — Inpatient Hospital Stay (HOSPITAL_COMMUNITY): Payer: 59

## 2011-06-17 ENCOUNTER — Encounter (HOSPITAL_COMMUNITY): Payer: Self-pay | Admitting: Anesthesiology

## 2011-06-17 ENCOUNTER — Encounter (HOSPITAL_COMMUNITY)
Admission: EM | Disposition: A | Discharge status: Home Health | Payer: Self-pay | Source: Home / Self Care | Attending: Family Medicine

## 2011-06-17 ENCOUNTER — Inpatient Hospital Stay (HOSPITAL_COMMUNITY): Payer: 59 | Admitting: Anesthesiology

## 2011-06-17 HISTORY — PX: IRRIGATION AND DEBRIDEMENT ABSCESS: SHX5252

## 2011-06-17 LAB — BASIC METABOLIC PANEL
BUN: 4 mg/dL — ABNORMAL LOW (ref 6–23)
Creatinine, Ser: 0.44 mg/dL — ABNORMAL LOW (ref 0.50–1.35)
GFR calc Af Amer: >90 mL/min (ref >90)
GFR calc non Af Amer: >90 mL/min (ref >90)

## 2011-06-17 LAB — CBC
MCHC: 30.1 g/dL (ref 30.0–36.0)
RDW: 15.3 % (ref 11.5–15.5)

## 2011-06-17 LAB — TYPE AND SCREEN
ABO/RH(D): A POS
Antibody Screen: NEGATIVE

## 2011-06-17 SURGERY — IRRIGATION AND DEBRIDEMENT ABSCESS
Anesthesia: General | Site: Buttocks | Wound class: Dirty or Infected

## 2011-06-17 MED ORDER — CIPROFLOXACIN HCL 500 MG PO TABS
500.0000 mg | ORAL_TABLET | Freq: Two times a day (BID) | ORAL | Status: DC
  Administered 2011-06-17 – 2011-06-18 (×2): 500 mg via ORAL
  Filled 2011-06-17 (×5): qty 1

## 2011-06-17 MED ORDER — DROPERIDOL 2.5 MG/ML IJ SOLN
INTRAMUSCULAR | Status: DC | PRN
  Administered 2011-06-17: 0.625 mg via INTRAVENOUS

## 2011-06-17 MED ORDER — NEOSTIGMINE METHYLSULFATE 1 MG/ML IJ SOLN
INTRAMUSCULAR | Status: DC | PRN
  Administered 2011-06-17: 4 mg via INTRAVENOUS

## 2011-06-17 MED ORDER — LACTATED RINGERS IV SOLN
INTRAVENOUS | Status: DC | PRN
  Administered 2011-06-17: 16:00:00 via INTRAVENOUS

## 2011-06-17 MED ORDER — ONDANSETRON HCL 4 MG/2ML IJ SOLN
INTRAMUSCULAR | Status: DC | PRN
  Administered 2011-06-17: 4 mg via INTRAVENOUS

## 2011-06-17 MED ORDER — FENTANYL CITRATE 0.05 MG/ML IJ SOLN
INTRAMUSCULAR | Status: DC | PRN
  Administered 2011-06-17: 100 ug via INTRAVENOUS
  Administered 2011-06-17 (×3): 50 ug via INTRAVENOUS

## 2011-06-17 MED ORDER — SUCCINYLCHOLINE CHLORIDE 20 MG/ML IJ SOLN
INTRAMUSCULAR | Status: DC | PRN
  Administered 2011-06-17: 100 mg via INTRAVENOUS

## 2011-06-17 MED ORDER — VANCOMYCIN HCL 1000 MG IV SOLR
1250.0000 mg | Freq: Three times a day (TID) | INTRAVENOUS | Status: DC
  Administered 2011-06-17 – 2011-06-18 (×4): 1250 mg via INTRAVENOUS
  Filled 2011-06-17 (×8): qty 1250

## 2011-06-17 MED ORDER — GLYCOPYRROLATE 0.2 MG/ML IJ SOLN
INTRAMUSCULAR | Status: DC | PRN
  Administered 2011-06-17: .6 mg via INTRAVENOUS

## 2011-06-17 MED ORDER — PROPOFOL 10 MG/ML IV EMUL
INTRAVENOUS | Status: DC | PRN
  Administered 2011-06-17: 200 mg via INTRAVENOUS

## 2011-06-17 MED ORDER — SODIUM CHLORIDE 0.9 % IV SOLN
500.0000 mg | Freq: Once | INTRAVENOUS | Status: AC
  Administered 2011-06-17: 500 mg via INTRAVENOUS
  Filled 2011-06-17: qty 10

## 2011-06-17 MED ORDER — MIDAZOLAM HCL 5 MG/5ML IJ SOLN
INTRAMUSCULAR | Status: DC | PRN
  Administered 2011-06-17: 2 mg via INTRAVENOUS

## 2011-06-17 MED ORDER — SODIUM CHLORIDE 0.9 % IJ SOLN
10.0000 mL | INTRAMUSCULAR | Status: DC | PRN
  Administered 2011-06-18: 10 mL

## 2011-06-17 MED ORDER — LIDOCAINE HCL (CARDIAC) 20 MG/ML IV SOLN
INTRAVENOUS | Status: DC | PRN
  Administered 2011-06-17: 100 mg via INTRAVENOUS

## 2011-06-17 MED ORDER — POTASSIUM CHLORIDE CRYS ER 20 MEQ PO TBCR
40.0000 meq | EXTENDED_RELEASE_TABLET | ORAL | Status: AC
  Administered 2011-06-17 – 2011-06-18 (×3): 40 meq via ORAL
  Filled 2011-06-17 (×3): qty 2

## 2011-06-17 MED ORDER — HYDROMORPHONE HCL PF 1 MG/ML IJ SOLN
0.2500 mg | INTRAMUSCULAR | Status: DC | PRN
Start: 2011-06-17 — End: 2011-06-17
  Administered 2011-06-17 (×4): 0.5 mg via INTRAVENOUS

## 2011-06-17 MED ORDER — ROCURONIUM BROMIDE 100 MG/10ML IV SOLN
INTRAVENOUS | Status: DC | PRN
  Administered 2011-06-17: 20 mg via INTRAVENOUS

## 2011-06-17 MED ORDER — HYDRALAZINE HCL 20 MG/ML IJ SOLN
10.0000 mg | Freq: Four times a day (QID) | INTRAMUSCULAR | Status: DC | PRN
  Administered 2011-06-17 – 2011-06-21 (×7): 10 mg via INTRAVENOUS
  Filled 2011-06-17 (×8): qty 1

## 2011-06-17 SURGICAL SUPPLY — 27 items
BANDAGE GAUZE ELAST BULKY 4 IN (GAUZE/BANDAGES/DRESSINGS) IMPLANT
CANISTER SUCTION 2500CC (MISCELLANEOUS) ×2 IMPLANT
CLOTH BEACON ORANGE TIMEOUT ST (SAFETY) ×2 IMPLANT
COVER SURGICAL LIGHT HANDLE (MISCELLANEOUS) ×2 IMPLANT
DRAPE EXTREMITY T 121X128X90 (DRAPE) IMPLANT
DRAPE LG THREE QUARTER DISP (DRAPES) ×2 IMPLANT
DRSG PAD ABDOMINAL 8X10 ST (GAUZE/BANDAGES/DRESSINGS) ×1 IMPLANT
ELECT REM PT RETURN 9FT ADLT (ELECTROSURGICAL) ×2
ELECTRODE REM PT RTRN 9FT ADLT (ELECTROSURGICAL) ×1 IMPLANT
GAUZE KERLIX 2  STERILE LF (GAUZE/BANDAGES/DRESSINGS) ×1 IMPLANT
GAUZE SPONGE 4X4 12PLY STRL LF (GAUZE/BANDAGES/DRESSINGS) ×1 IMPLANT
GLOVE BIOGEL PI IND STRL 7.0 (GLOVE) ×1 IMPLANT
GLOVE BIOGEL PI INDICATOR 7.0 (GLOVE) ×1
GLOVE ECLIPSE 8.0 STRL XLNG CF (GLOVE) ×2 IMPLANT
GLOVE INDICATOR 8.0 STRL GRN (GLOVE) ×4 IMPLANT
GOWN STRL NON-REIN LRG LVL3 (GOWN DISPOSABLE) ×2 IMPLANT
GOWN STRL REIN XL XLG (GOWN DISPOSABLE) ×4 IMPLANT
KIT BASIN OR (CUSTOM PROCEDURE TRAY) ×2 IMPLANT
NS IRRIG 1000ML POUR BTL (IV SOLUTION) ×2 IMPLANT
PACK GENERAL/GYN (CUSTOM PROCEDURE TRAY) ×2 IMPLANT
SPONGE GAUZE 4X4 12PLY (GAUZE/BANDAGES/DRESSINGS) ×2 IMPLANT
STOCKINETTE 8 INCH (MISCELLANEOUS) IMPLANT
SWAB COLLECTION DEVICE MRSA (MISCELLANEOUS) IMPLANT
TAPE CLOTH SURG 4X10 WHT LF (GAUZE/BANDAGES/DRESSINGS) ×1 IMPLANT
TOWEL OR 17X26 10 PK STRL BLUE (TOWEL DISPOSABLE) ×2 IMPLANT
TUBE ANAEROBIC SPECIMEN COL (MISCELLANEOUS) IMPLANT
UNDERPAD 30X30 INCONTINENT (UNDERPADS AND DIAPERS) IMPLANT

## 2011-06-17 NOTE — Transfer of Care (Signed)
Immediate Anesthesia Transfer of Care Note  Patient: [name]  Procedure(s) Performed: Procedure(s) (LRB): IRRIGATION AND DEBRIDEMENT ABSCESS (N/A)  Patient Location: PACU  Anesthesia Type: General  Level of Consciousness: awake, alert  and oriented  Airway & Oxygen Therapy: Patient Spontanous Breathing and Patient connected to face mask oxygen  Post-op Assessment: Report given to PACU RN and Post -op Vital signs reviewed and stable  Post vital signs: Reviewed and stable  Complications: No apparent anesthesia complications

## 2011-06-17 NOTE — Progress Notes (Signed)
Pharmacy Note (Brief) Consult:  IV Iron   Pharmacy was consulted 3/8 to dose IV iron aiming for a target Hgb of 10.  So far patient received IV Iron Dextran 25 mg test dose, followed by 2 doses of 500 mg IV (3/9 and 3/10).  Hgb improved to 8.2 (pt also received 2 units of PRBCs 3/9.   Re-calculated iron requirement = ~1700 mg.   Plan:  Will give another dose of 500 mg IV Iron dextran today.  No test dose needed.  Geoffry Paradise, PharmD.   Pager:  [phone] 12:42 PM

## 2011-06-17 NOTE — Progress Notes (Signed)
BP 192/78, HR 67. M. Burnadette Peter, NP text paged. Awaiting return call. Will continue to monitor.

## 2011-06-17 NOTE — Anesthesia Postprocedure Evaluation (Signed)
  Anesthesia Post-op Note  Patient: [name]  Procedure(s) Performed: Procedure(s) (LRB): IRRIGATION AND DEBRIDEMENT ABSCESS (N/A)  Patient Location: PACU  Anesthesia Type: General  Level of Consciousness: oriented and sedated  Airway and Oxygen Therapy: Patient Spontanous Breathing and Patient connected to nasal cannula oxygen  Post-op Pain: mild  Post-op Assessment: Post-op Vital signs reviewed, Patient's Cardiovascular Status Stable, Respiratory Function Stable and Patent Airway  Post-op Vital Signs: stable  Complications: No apparent anesthesia complications

## 2011-06-17 NOTE — Preoperative (Signed)
Beta Blockers   Reason not to administer Beta Blockers:Not Applicable 

## 2011-06-17 NOTE — Progress Notes (Signed)
Subjective: A lot of wound drainage.  Objective: Vital signs in last 24 hours: Temp:  [97.3 F (36.3 C)-99.1 F (37.3 C)] 99.1 F (37.3 C) (03/11 0602) Pulse Rate:  [67-84] 67  (03/11 0602) Resp:  [20] 20  (03/11 0602) BP: (173-192)/(71-90) 192/78 mmHg (03/11 0602) SpO2:  [94 %-95 %] 95 % (03/11 0602) Last BM Date: 06/16/11  Intake/Output from previous day: 03/10 0701 - 03/11 0700 In: 3178 [P.O.:720; I.V.:1800; IV Piggyback:658] Out: 3101 [Urine:3100; Stool:1] Intake/Output this shift:    PE: Large sacral wound with some purulent drainage and necrotic tissue.  No fecal contamination. Lab Results:   Basename 06/17/11 0530 06/16/11 0445  WBC 9.7 10.3  HGB 8.2* 7.8*  HCT 27.2* 25.5*  PLT 320 311   BMET  Basename 06/17/11 0530 06/16/11 0445  NA 138 135  K 3.2* 3.4*  CL 101 101  CO2 32 30  GLUCOSE 96 92  BUN 4* 3*  CREATININE 0.44* 0.47*  CALCIUM 8.1* 8.0*   PT/INR No results found for this basename: LABPROT:2,INR:2 in the last 72 hours Comprehensive Metabolic Panel:    Component Value Date/Time   NA 138 06/17/2011 0530   K 3.2* 06/17/2011 0530   CL 101 06/17/2011 0530   CO2 32 06/17/2011 0530   BUN 4* 06/17/2011 0530   CREATININE 0.44* 06/17/2011 0530   GLUCOSE 96 06/17/2011 0530   CALCIUM 8.1* 06/17/2011 0530   AST 19 06/13/2011 1647   ALT 17 06/13/2011 1647   ALKPHOS 143* 06/13/2011 1647   BILITOT 0.1* 06/13/2011 1647   PROT 6.7 06/13/2011 1647   ALBUMIN 2.1* 06/13/2011 1647     Studies/Results: No results found.  Anti-infectives: Anti-infectives     Start     Dose/Rate Route Frequency Ordered Stop   06/17/11 0800   vancomycin (VANCOCIN) 1,250 mg in sodium chloride 0.9 % 250 mL IVPB        1,250 mg 166.7 mL/hr over 90 Minutes Intravenous Every 8 hours 06/17/11 0232     06/14/11 1200   vancomycin (VANCOCIN) 1,250 mg in sodium chloride 0.9 % 250 mL IVPB  Status:  Discontinued        1,250 mg 166.7 mL/hr over 90 Minutes Intravenous Every 12 hours 06/14/11  0334 06/17/11 0232   06/14/11 0000   vancomycin (VANCOCIN) 1,500 mg in sodium chloride 0.9 % 500 mL IVPB  Status:  Discontinued        1,500 mg 250 mL/hr over 120 Minutes Intravenous Every 12 hours 06/13/11 2313 06/14/11 0334   06/13/11 2315  piperacillin-tazobactam (ZOSYN) IVPB 3.375 g       3.375 g 12.5 mL/hr over 240 Minutes Intravenous 3 times per day 06/13/11 2313     06/13/11 1700   vancomycin (VANCOCIN) IVPB 1000 mg/200 mL premix        1,000 mg 200 mL/hr over 60 Minutes Intravenous To Emergency Dept 06/13/11 1549 06/13/11 1745   06/13/11 1700  piperacillin-tazobactam (ZOSYN) IVPB 3.375 g       3.375 g 100 mL/hr over 30 Minutes Intravenous To Emergency Dept 06/13/11 1549 06/13/11 1714          Assessment Principal Problem:  *Decubitus ulcer of buttock, stage 4-needs debridement Active Problems:  Hyponatremia  Paraplegia following spinal cord injury  UTI (lower urinary tract infection)  Anemia  Malnourished    LOS: 4 days   Plan: Debridement of sacral decubitus today.  No need for colostomy at this time.  The procedure and risks (including but not  limited to bleeding, anesthesia, quadriplegia, need for further operations) were explained to him.  He seems to understand and agrees to proceed.   Louis Gaw J 06/17/2011

## 2011-06-17 NOTE — Progress Notes (Signed)
Subjective: Patient for surgical debridement today. Still having issues with pain control.    Objective: Filed Vitals:   06/17/11 1800 06/17/11 1815 06/17/11 1830 06/17/11 1845  BP: 153/88 160/86 167/84 161/84  Pulse: 103 101 97 96  Temp: 98.5 F (36.9 C)   97.5 F (36.4 C)  TempSrc:      Resp: 19 25 22 16   Height:      Weight:      SpO2: 97% 96% 97% 95%   Weight change:   Intake/Output Summary (Last 24 hours) at 06/17/11 2059 Last data filed at 06/17/11 1800  Gross per 24 hour  Intake   1950 ml  Output   3850 ml  Net  -1900 ml    General: Alert, awake, oriented x3, in no acute distress.  HEENT: [place]/AT [place]ER[place], EOMI Neck: Trachea midline,  no masses, no thyromegal,y no JVD, no carotid bruit Oropharynx:  Moist, No exudate/ erythema/lesions.  Heart: Regular rate and rhythm, without murmurs, rubs, gallops, PMI non-displaced, no heaves or thrills on palpation.  Lungs: Clear to auscultation, no wheezing or rhonchi noted. No increased vocal fremitus resonant to percussion  Abdomen: Soft, nontender, nondistended, positive bowel sounds, no masses no hepatosplenomegaly noted..  Neuro: No focal neurological deficits noted cranial nerves II through XII grossly intact. DTRs 2+ bilaterally upper and lower extremities. Strength 5/5 in bilateral upper extremities. Pt is paraplegic. Musculoskeletal: No warm swelling or erythema around joints, no spinal tenderness noted. Skin: Large stage IV decubitus ulcer at the sacral region. Patient also has a stage IV pressure ulcer on the left heel.  Lab Results:  Basename 06/17/11 0530 06/16/11 0445  NA 138 135  K 3.2* 3.4*  CL 101 101  CO2 32 30  GLUCOSE 96 92  BUN 4* 3*  CREATININE 0.44* 0.47*  CALCIUM 8.1* 8.0*  MG -- --  PHOS -- --   No results found for this basename: AST:2,ALT:2,ALKPHOS:2,BILITOT:2,PROT:2,ALBUMIN:2 in the last 72 hours No results found for this basename: LIPASE:2,AMYLASE:2 in the last 72 hours  Basename 06/17/11 0530  06/16/11 0445 06/15/11 0520  WBC 9.7 10.3 --  NEUTROABS -- 7.7 7.4  HGB 8.2* 7.8* --  HCT 27.2* 25.5* --  MCV 80.2 79.9 --  PLT 320 311 --   No results found for this basename: CKTOTAL:3,CKMB:3,CKMBINDEX:3,TROPONINI:3 in the last 72 hours No components found with this basename: POCBNP:3 No results found for this basename: DDIMER:2 in the last 72 hours No results found for this basename: HGBA1C:2 in the last 72 hours No results found for this basename: CHOL:2,HDL:2,LDLCALC:2,TRIG:2,CHOLHDL:2,LDLDIRECT:2 in the last 72 hours No results found for this basename: TSH,T4TOTAL,FREET3,T3FREE,THYROIDAB in the last 72 hours No results found for this basename: VITAMINB12:2,FOLATE:2,FERRITIN:2,TIBC:2,IRON:2,RETICCTPCT:2 in the last 72 hours  Micro Results: Recent Results (from the past 240 hour(s))  CULTURE, BLOOD (ROUTINE X 2)     Status: Normal (Preliminary result)   Collection Time   06/13/11  4:47 PM      Component Value Range Status Comment   Specimen Description BLOOD RIGHT ARM   Final    Special Requests BOTTLES DRAWN AEROBIC AND ANAEROBIC   Final    Culture  Setup Time [id number]   Final    Culture     Final    Value:        BLOOD CULTURE RECEIVED NO GROWTH TO DATE CULTURE WILL BE HELD FOR 5 DAYS BEFORE ISSUING A FINAL NEGATIVE REPORT   Report Status PENDING   Incomplete   CULTURE, BLOOD (ROUTINE X  2)     Status: Normal (Preliminary result)   Collection Time   06/13/11  4:48 PM      Component Value Range Status Comment   Specimen Description BLOOD LEFT ARM   Final    Special Requests BOTTLES DRAWN AEROBIC AND ANAEROBIC   Final    Culture  Setup Time [id number]   Final    Culture     Final    Value:        BLOOD CULTURE RECEIVED NO GROWTH TO DATE CULTURE WILL BE HELD FOR 5 DAYS BEFORE ISSUING A FINAL NEGATIVE REPORT   Report Status PENDING   Incomplete   WOUND CULTURE     Status: Normal   Collection Time   06/13/11  4:48 PM      Component Value Range Status Comment    Specimen Description PERIRECTAL   Final    Special Requests NONE   Final    Gram Stain     Final    Value: NO WBC SEEN     NO SQUAMOUS EPITHELIAL CELLS SEEN     RARE GRAM POSITIVE COCCI IN PAIRS     RARE GRAM NEGATIVE RODS   Culture     Final    Value: Multiple Species Consistent With Normal Fecal Flora   Report Status 06/16/2011 FINAL   Final   URINE CULTURE     Status: Normal   Collection Time   06/13/11  5:06 PM      Component Value Range Status Comment   Specimen Description URINE, CATHETERIZED   Final    Special Requests NONE   Final    Culture  Setup Time [id number]   Final    Colony Count 4,000 COLONIES/ML   Final    Culture KLEBSIELLA PNEUMONIAE   Final    Report Status 06/16/2011 FINAL   Final    Organism ID, Bacteria KLEBSIELLA PNEUMONIAE   Final     Studies/Results: Ct Abdomen Pelvis W Contrast  06/13/2011  *RADIOLOGY REPORT*  Clinical Data: Bleeding sacral decubitus ulcer.  CT ABDOMEN AND PELVIS WITH CONTRAST  Technique:  Multidetector CT imaging of the abdomen and pelvis was performed following the standard protocol during bolus administration of intravenous contrast.  Contrast: OMNIPAQUE IOHEXOL 300 MG/ML IJ SOLN  Comparison: CT scan dated 02/18/2005  Findings: The liver, spleen, adrenal glands, and left kidney are normal.  Gallbladder has been removed.  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  There is an 11.6 cm simple appearing cyst in the lateral aspect of the right kidney.  The bowel appears normal. Foley catheter is in place in the bladder.  There is a prominent sacral decubitus ulcer in the midline.  There is adjacent cellulitis.  Air is seen in the soft tissues in the right buttock adjacent to the gluteal crease consistent with infection in the soft tissues.  There is also air in the subcutaneous fat of the perineum on images 97 through 99.  There is an abscess which tracks into the right buttock superficial to the  right gluteus medius muscle.  There is air and a tiny amount of pus within this abscess. This appears to extend to the sacral decubitus ulcer.  Chronic sclerosis of the posterior aspect of the sacrum without discrete osteomyelitis.  IMPRESSION:  1.  Prominent sacral decubitus ulcer.  There is an abscess was extends from the right side of the sacral decubitus ulcer into  the right buttock with a small amount of air and pus within the abscess.  This is approximately 10 cm in length with only a 18 mm in diameter. 2. No acute intra-abdominal abnormality.  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 These are usually not aggressive lesions.  Original Report Authenticated By: Gwynn Burly, M.D.   Dg Chest Port 1 View  06/13/2011  *RADIOLOGY REPORT*  Clinical Data: Shortness of breath and cough.  PORTABLE CHEST - 1 VIEW  Comparison: 02/09/2008  Findings: Heart size and vascularity are normal and the lungs are clear.  No osseous abnormality.  IMPRESSION: Normal chest.  Original Report Authenticated By: Gwynn Burly, M.D.    Medications: I have reviewed the patient's current medications. Scheduled Meds:    . benazepril  40 mg Oral Daily  . carisoprodol  700 mg Oral QID  . feeding supplement  30 mL Oral TID WC  . ferrous sulfate  650 mg Oral Daily  . fluticasone  2 spray Each Nare Daily  . iron dextran (INFED/DEXFERRRUM) 500 MG IVPB  500 mg Intravenous Once  . loratadine  10 mg Oral Daily  . mulitivitamin  5 mL Oral Daily  . piperacillin-tazobactam (ZOSYN)  IV  3.375 g Intravenous Q8H  . pyridOXINE  50 mg Oral Daily  . sodium chloride  3 mL Intravenous Q12H  . vancomycin  1,250 mg Intravenous Q8H  . DISCONTD: chlorhexidine  1 application Topical Once  . DISCONTD: vancomycin  1,250 mg Intravenous Q12H   Continuous Infusions:    . sodium chloride 75 mL/hr at 06/17/11 1344   PRN Meds:.albuterol, diazepam, hydrALAZINE, HYDROmorphone,  ondansetron (ZOFRAN) IV, ondansetron, oxyCODONE, oxyCODONE-acetaminophen, sodium chloride, DISCONTD: HYDROmorphone Assessment/Plan: Patient Active Hospital Problem List: Decubitus ulcer of buttock, stage 4 (06/13/2011)   Assessment: Pt will be seen by Surgery and evaluated for surgical debridement. Pt's debridement was postponed for today and is scheduled for surgical debridement tomorrow. Will continue vancomycin and Zosyn and local wound care.  Anemia (06/13/2011)   Assessment: Serum iron less than 10. IV iron initiated.    Hyponatremia (06/13/2011)   Assessment: This is improved with IV fluids.    Paraplegia following spinal cord injury (06/13/2011)   Assessment: Chronic condition contributing to his decubitus ulcers.    Malnourished (06/13/2011)   Assessment: A dietary consult.   Klebsiella UTI (3/1102013)   Assessment: Pt on Zosyn to which Klebsiella is resistant. Will add ciprofloxacin.  Hypokalemia (06/17/2011)   Assessment: Replete   LOS: 4 days

## 2011-06-17 NOTE — Progress Notes (Signed)
Pt refused to turn for sacral wound assessment and also refused to have left heels dressing removed. Julio Sicks RN

## 2011-06-17 NOTE — Op Note (Signed)
Operative Note  [name] male 61 y.o. 06/17/2011  PREOPERATIVE DX:  Necrotic sacral decubitus wound  POSTOPERATIVE DX:  Same  PROCEDURE:Debridement of necrotic sacral decubitus wound         Surgeon: Adolph Pollack   Assistants: None  Anesthesia: General endotracheal anesthesia  Indications: This is a 61 year old male with a chronic sacral decubitus wound. He has a number of areas of necrotic tissue and is now brought to the operating room for debridement.    Procedure Detail:  He is brought to the operating room laid supine on the operating table and a general anesthetic was administered. He was then turned onto the right lateral decubitus position. The bandage over the sacrum was removed and the area sterilely prepped and draped. Using electrocautery I then began debriding necrotic tissue down to bleeding tissue a number of areas in the wound. There was a necrotic area of skin that I also debrided full-thickness way from the wound. There is a bridge of skin that I opened up and found more necrotic tissue deep into the subcutaneous area down to the muscle which I debrided. Some of this tissue was sent to pathology.  I then inspected the wounds and hemostasis was adequate.  The wound was then packed with saline moistened gauze followed by dry dressing. He tolerated the procedures without any apparent complications.    Estimated Blood Loss:  less than 100 mL         Drains: none          Blood Given: none          Specimens: Wound tissue        Complications:  * No complications entered in OR log *         Disposition: PACU - hemodynamically stable.         Condition: stable

## 2011-06-17 NOTE — Progress Notes (Signed)
Orders placed in epicby M. Burnadette Peter, NP  for Hydralazine 10mg  IV. Order followed through.

## 2011-06-17 NOTE — Progress Notes (Signed)
ANTIBIOTIC CONSULT NOTE - FOLLOW UP  Pharmacy Consult for vancomycin Indication: Decub sarcal   Allergies  Allergen Reactions  . Ciprofloxacin   . Clonidine Derivatives   . Darvocet (Propoxyphene N-Acetaminophen)   . Decadron (Dexamethasone)   . Lipitor (Atorvastatin Calcium)   . Prednisolone     Patient Measurements: Height: 6\' 7"  (200.7 cm) Weight: 311 lb 4.6 oz (141.2 kg) IBW/kg (Calculated) : 93.7  Adjusted Body Weight:   Vital Signs: Temp: 98.3 F (36.8 C) (03/10 2141) Temp src: Oral (03/10 2141) BP: 178/90 mmHg (03/10 2141) Pulse Rate: 69  (03/10 2141) Intake/Output from previous day: 03/10 0701 - 03/11 0700 In: 3178 [P.O.:720; I.V.:1800; IV Piggyback:658] Out: 2501 [Urine:2500; Stool:1] Intake/Output from this shift: Total I/O In: -  Out: 300 [Urine:300]  Labs:  [facility] 06/16/11 0445 06/15/11 0520 06/14/11 0449  WBC 10.3 10.1 11.5*  HGB 7.8* 6.7* 7.5*  PLT 311 297 300  LABCREA -- -- --  CREATININE 0.47* 0.56 0.49*   Estimated Creatinine Clearance: 156.5 ml/min (by C-G formula based on Cr of 0.47).  Basename 06/16/11 2345  VANCOTROUGH 8.0*  VANCOPEAK --  Drue Dun --  GENTTROUGH --  GENTPEAK --  GENTRANDOM --  TOBRATROUGH --  TOBRAPEAK --  TOBRARND --  AMIKACINPEAK --  AMIKACINTROU --  AMIKACIN --     Microbiology: Recent Results (from the past 720 hour(s))  CULTURE, BLOOD (ROUTINE X 2)     Status: Normal (Preliminary result)   Collection Time   06/13/11  4:47 PM      Component Value Range Status Comment   Specimen Description BLOOD RIGHT ARM   Final    Special Requests BOTTLES DRAWN AEROBIC AND ANAEROBIC   Final    Culture  Setup Time [id number]   Final    Culture     Final    Value:        BLOOD CULTURE RECEIVED NO GROWTH TO DATE CULTURE WILL BE HELD FOR 5 DAYS BEFORE ISSUING A FINAL NEGATIVE REPORT   Report Status PENDING   Incomplete   CULTURE, BLOOD (ROUTINE X 2)     Status: Normal (Preliminary result)   Collection Time   06/13/11  4:48 PM      Component Value Range Status Comment   Specimen Description BLOOD LEFT ARM   Final    Special Requests BOTTLES DRAWN AEROBIC AND ANAEROBIC   Final    Culture  Setup Time [id number]   Final    Culture     Final    Value:        BLOOD CULTURE RECEIVED NO GROWTH TO DATE CULTURE WILL BE HELD FOR 5 DAYS BEFORE ISSUING A FINAL NEGATIVE REPORT   Report Status PENDING   Incomplete   WOUND CULTURE     Status: Normal   Collection Time   06/13/11  4:48 PM      Component Value Range Status Comment   Specimen Description PERIRECTAL   Final    Special Requests NONE   Final    Gram Stain     Final    Value: NO WBC SEEN     NO SQUAMOUS EPITHELIAL CELLS SEEN     RARE GRAM POSITIVE COCCI IN PAIRS     RARE GRAM NEGATIVE RODS   Culture     Final    Value: Multiple Species Consistent With Normal Fecal Flora   Report Status 06/16/2011 FINAL   Final   URINE CULTURE     Status: Normal  Collection Time   06/13/11  5:06 PM      Component Value Range Status Comment   Specimen Description URINE, CATHETERIZED   Final    Special Requests NONE   Final    Culture  Setup Time [id number]   Final    Colony Count 4,000 COLONIES/ML   Final    Culture KLEBSIELLA PNEUMONIAE   Final    Report Status 06/16/2011 FINAL   Final    Organism ID, Bacteria KLEBSIELLA PNEUMONIAE   Final     Anti-infectives     Start     Dose/Rate Route Frequency Ordered Stop   06/17/11 0800   vancomycin (VANCOCIN) 1,250 mg in sodium chloride 0.9 % 250 mL IVPB        1,250 mg 166.7 mL/hr over 90 Minutes Intravenous Every 8 hours 06/17/11 0232     06/14/11 1200   vancomycin (VANCOCIN) 1,250 mg in sodium chloride 0.9 % 250 mL IVPB  Status:  Discontinued        1,250 mg 166.7 mL/hr over 90 Minutes Intravenous Every 12 hours 06/14/11 0334 06/17/11 0232   06/14/11 0000   vancomycin (VANCOCIN) 1,500 mg in sodium chloride 0.9 % 500 mL IVPB  Status:  Discontinued        1,500 mg 250 mL/hr over 120 Minutes  Intravenous Every 12 hours 06/13/11 2313 06/14/11 0334   06/13/11 2315  piperacillin-tazobactam (ZOSYN) IVPB 3.375 g       3.375 g 12.5 mL/hr over 240 Minutes Intravenous 3 times per day 06/13/11 2313     06/13/11 1700   vancomycin (VANCOCIN) IVPB 1000 mg/200 mL premix        1,000 mg 200 mL/hr over 60 Minutes Intravenous To Emergency Dept 06/13/11 1549 06/13/11 1745   06/13/11 1700  piperacillin-tazobactam (ZOSYN) IVPB 3.375 g       3.375 g 100 mL/hr over 30 Minutes Intravenous To Emergency Dept 06/13/11 1549 06/13/11 1714          Assessment: Patient with low vancomycin level.  Goal of Therapy:  Vancomycin trough level 15-20 mcg/ml  Plan:  Measure antibiotic drug levels at steady state Follow up culture results Vancomycin 1250mg  iv q8hr  Darlina Guys, Jacquenette Shone Crowford 06/17/2011,2:32 AM

## 2011-06-17 NOTE — Progress Notes (Signed)
Physical Therapy Note  Noted pt scheduled for surgery on today and pt is still on strict bedrest. Will hold PT evaluation on today. When pt is appropriate for physical therapy, MD please clarify any restrictions and update activity level in order set for participation with physical therapy. Thanks.      Rebeca Alert, PT [phone]

## 2011-06-18 LAB — CBC
HCT: 29 % — ABNORMAL LOW (ref 39.0–52.0)
MCH: 24.3 pg — ABNORMAL LOW (ref 26.0–34.0)
MCHC: 30.3 g/dL (ref 30.0–36.0)
MCV: 80.1 fL (ref 78.0–100.0)
Platelets: 336 10*3/uL (ref 150–400)
RDW: 15.7 % — ABNORMAL HIGH (ref 11.5–15.5)

## 2011-06-18 LAB — BASIC METABOLIC PANEL
BUN: 4 mg/dL — ABNORMAL LOW (ref 6–23)
Calcium: 8.4 mg/dL (ref 8.4–10.5)
Chloride: 101 mEq/L (ref 96–112)
Creatinine, Ser: 0.47 mg/dL — ABNORMAL LOW (ref 0.50–1.35)
GFR calc Af Amer: >90 mL/min (ref >90)

## 2011-06-18 LAB — DIFFERENTIAL
Basophils Relative: 0 % (ref 0–1)
Eosinophils Absolute: 0.1 10*3/uL (ref 0.0–0.7)
Eosinophils Relative: 1 % (ref 0–5)
Lymphs Abs: 1.8 10*3/uL (ref 0.7–4.0)
Neutrophils Relative %: 78 % — ABNORMAL HIGH (ref 43–77)

## 2011-06-18 LAB — FOLATE RBC: RBC Folate: 644 ng/mL — ABNORMAL HIGH (ref >=366)

## 2011-06-18 LAB — VANCOMYCIN, TROUGH: Vancomycin Tr: 11.3 ug/mL (ref 10.0–20.0)

## 2011-06-18 MED ORDER — VANCOMYCIN HCL 1000 MG IV SOLR
1500.0000 mg | Freq: Three times a day (TID) | INTRAVENOUS | Status: DC
  Administered 2011-06-19 – 2011-06-21 (×9): 1500 mg via INTRAVENOUS
  Filled 2011-06-18 (×12): qty 1500

## 2011-06-18 MED ORDER — HYDROMORPHONE HCL PF 2 MG/ML IJ SOLN
3.0000 mg | INTRAMUSCULAR | Status: DC | PRN
  Administered 2011-06-18 – 2011-06-21 (×27): 3 mg via INTRAVENOUS
  Filled 2011-06-18 (×27): qty 2

## 2011-06-18 MED ORDER — PROMETHAZINE HCL 25 MG/ML IJ SOLN
25.0000 mg | Freq: Four times a day (QID) | INTRAMUSCULAR | Status: DC | PRN
  Administered 2011-06-18 – 2011-06-21 (×4): 25 mg via INTRAVENOUS
  Filled 2011-06-18 (×4): qty 1

## 2011-06-18 MED ORDER — HYDROMORPHONE HCL PF 1 MG/ML IJ SOLN
1.0000 mg | Freq: Once | INTRAMUSCULAR | Status: AC
  Administered 2011-06-18: 1 mg via INTRAVENOUS
  Filled 2011-06-18: qty 1

## 2011-06-18 NOTE — Progress Notes (Addendum)
Physical Therapy Evaluation Patient Details Name: [name] MRN: [id number] DOB: Dec 20, 1950 Today's Date: 06/18/2011  Problem List:  Patient Active Problem List  Diagnoses  . Decubitus ulcer of buttock, stage 4  . Hyponatremia  . Paraplegia following spinal cord injury  . UTI (lower urinary tract infection)  . Anemia  . Malnourished    Past Medical History:  Past Medical History  Diagnosis Date  . Hypertension   . Coronary artery disease    Past Surgical History:  Past Surgical History  Procedure Date  . Back surgery     PT Assessment/Plan/Recommendation PT Assessment Clinical Impression Statement: 61 y.o. male with stage 4 sacral decubitus, s/p surgical debridement. Pt has been paraplegic x 4 yrs after a myelogram. He states he has had some strength/AROM in RLE but that this has decreased recently with this illness. He states LLE at baseline is flaccid.  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  Pt has hospital bed with air mattress, but states the air mattress is "shot" and that he needs a new one. Pt would benefit from acute PT for RLE strengthening and for BLE ROM exercises. Pt plans to DC home with HHPT. Will need to determine if wife can provide needed support at time of DC. PT Recommendation/Assessment: Patient will need skilled PT in the acute care venue PT Problem List: Decreased strength;Decreased range of motion;Decreased activity tolerance;Pain Barriers to Discharge: None PT Therapy Diagnosis : Other (comment) (paraplegia) PT Plan PT Frequency: Min 3X/week PT Treatment/Interventions: Therapeutic exercise;Therapeutic activities PT Recommendation Recommendations for Other Services: OT consult Follow Up Recommendations: Home health PT Equipment Recommended:  (new air mattress for hospital bed) PT Goals  Acute Rehab PT Goals PT Goal Formulation: With patient Time For Goal  Achievement: 2 weeks Pt will Transfer Bed to Chair/Chair to Bed: with mod assist (bed to motorized WC) PT Transfer Goal: Bed to Chair/Chair to Bed - Progress: Goal set today Pt will Perform Home Exercise Program: with min assist PT Goal: Perform Home Exercise Program - Progress: Goal set today  PT Evaluation Precautions/Restrictions  Precautions Precautions: Other (comment) (L heel wound, sacral decubitus stage 4) Restrictions Weight Bearing Restrictions: No Prior Functioning  Home Living Lives With: Spouse (wife works during the day) Actor Help From: Family;Home health Type of Home: House Home Layout: One level Home Access: Ramped entrance Home Adaptive Equipment: Hospital bed;Wheelchair - powered;Wheelchair - manual Prior Function Level of Independence: Needs assistance with ADLs;Needs assistance with tranfers (nonambulatory x 4 yrs; wife assists with transfers to [facility]) Vocation: Retired (retired Nurse, 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 Arousal/Alertness: Awake/alert Overall Cognitive Status: Appears within functional limits for tasks assessed Orientation Level: Oriented X4 Sensation/Coordination Sensation Light Touch: Impaired by gross assessment (absent LLE, diminished RLE) Extremity Assessment RUE Assessment RUE Assessment: Within Functional Limits LUE Assessment LUE Assessment: Within Functional Limits RLE Assessment RLE Assessment: Exceptions to [facility] RLE AROM (degrees) Right Hip ABduction 0-45: 25  Right Ankle Dorsiflexion 0-20: 0  RLE Strength Right Hip Flexion: 2-/5 (pt states he used to be able to do SLR) Right Ankle Dorsiflexion: 2-/5 LLE Assessment LLE Assessment: Exceptions to WFL LLE AROM (degrees) Overall AROM Left Lower Extremity:  (flaccid) LLE PROM (degrees) Overall PROM Left Lower Extremity: Within functional limits for tasks assessed LLE Strength LLE Overall Strength: Deficits;Due to premorbid status (flaccid) Mobility (including Balance) Bed  Mobility Bed Mobility: No (pt states he cant roll due to  painful kidney tumor) Transfers Transfers: No (pt declined transfer to recliner, would need air cushion) Ambulation/Gait Ambulation/Gait: No (Paraplegic x 4 yrs) Stairs: No    Exercise  General Exercises - Lower Extremity Ankle Circles/Pumps: PROM;Both;15 reps;Supine Quad Sets: Right;AROM;5 reps;Supine Heel Slides: AAROM;PROM;Both;15 reps;Supine Hip ABduction/ADduction: AAROM;PROM;Both;10 reps;Supine End of Session PT - End of Session Activity Tolerance: Patient limited by pain (LE ROM increased LBP, pain meds requested) Patient left: in bed General Behavior During Session: [facility] for tasks performed Cognition: [facility] for tasks performed  Tamala Ser 06/18/2011, 10:18 AM [phone]

## 2011-06-18 NOTE — Progress Notes (Signed)
ANTIBIOTIC CONSULT NOTE - FOLLOW UP  Pharmacy Consult for vancomycin Indication: Decub sarcal   Allergies  Allergen Reactions  . Ciprofloxacin   . Clonidine Derivatives   . Darvocet (Propoxyphene N-Acetaminophen)   . Decadron (Dexamethasone)   . Lipitor (Atorvastatin Calcium)   . Prednisolone     Patient Measurements: Height: 6\' 7"  (200.7 cm) Weight: 311 lb 4.6 oz (141.2 kg) IBW/kg (Calculated) : 93.7  Adjusted Body Weight:   Vital Signs: Temp: 98.4 F (36.9 C) (03/12 1350) Temp src: Oral (03/12 1350) BP: 172/81 mmHg (03/12 1350) Pulse Rate: 70  (03/12 1350) Intake/Output from previous day: 03/11 0701 - 03/12 0700 In: 3755 [P.O.:480; I.V.:2375; IV Piggyback:900] Out: 3675 [Urine:3625; Blood:50] Intake/Output from this shift: Total I/O In: 1100 [I.V.:600; IV Piggyback:500] Out: 726 [Urine:725; Stool:1]  Labs:  [facility] 06/18/11 0355 06/17/11 0530 06/16/11 0445  WBC 11.5* 9.7 10.3  HGB 8.8* 8.2* 7.8*  PLT 336 320 311  LABCREA -- -- --  CREATININE 0.47* 0.44* 0.47*   Estimated Creatinine Clearance: 156.5 ml/min (by C-G formula based on Cr of 0.47).  Basename 06/18/11 1555 06/16/11 2345  VANCOTROUGH 11.3 8.0*  VANCOPEAK -- --  Drue Dun -- --  GENTTROUGH -- --  GENTPEAK -- --  GENTRANDOM -- --  TOBRATROUGH -- --  TOBRAPEAK -- --  TOBRARND -- --  AMIKACINPEAK -- --  AMIKACINTROU -- --  AMIKACIN -- --     Microbiology: Recent Results (from the past 720 hour(s))  CULTURE, BLOOD (ROUTINE X 2)     Status: Normal (Preliminary result)   Collection Time   06/13/11  4:47 PM      Component Value Range Status Comment   Specimen Description BLOOD RIGHT ARM   Final    Special Requests BOTTLES DRAWN AEROBIC AND ANAEROBIC   Final    Culture  Setup Time [id number]   Final    Culture     Final    Value:        BLOOD CULTURE RECEIVED NO GROWTH TO DATE CULTURE WILL BE HELD FOR 5 DAYS BEFORE ISSUING A FINAL NEGATIVE REPORT   Report Status PENDING   Incomplete    CULTURE, BLOOD (ROUTINE X 2)     Status: Normal (Preliminary result)   Collection Time   06/13/11  4:48 PM      Component Value Range Status Comment   Specimen Description BLOOD LEFT ARM   Final    Special Requests BOTTLES DRAWN AEROBIC AND ANAEROBIC   Final    Culture  Setup Time [id number]   Final    Culture     Final    Value:        BLOOD CULTURE RECEIVED NO GROWTH TO DATE CULTURE WILL BE HELD FOR 5 DAYS BEFORE ISSUING A FINAL NEGATIVE REPORT   Report Status PENDING   Incomplete   WOUND CULTURE     Status: Normal   Collection Time   06/13/11  4:48 PM      Component Value Range Status Comment   Specimen Description PERIRECTAL   Final    Special Requests NONE   Final    Gram Stain     Final    Value: NO WBC SEEN     NO SQUAMOUS EPITHELIAL CELLS SEEN     RARE GRAM POSITIVE COCCI IN PAIRS     RARE GRAM NEGATIVE RODS   Culture     Final    Value: Multiple Species Consistent With Normal Fecal Flora  Report Status 06/16/2011 FINAL   Final   URINE CULTURE     Status: Normal   Collection Time   06/13/11  5:06 PM      Component Value Range Status Comment   Specimen Description URINE, CATHETERIZED   Final    Special Requests NONE   Final    Culture  Setup Time [id number]   Final    Colony Count 4,000 COLONIES/ML   Final    Culture KLEBSIELLA PNEUMONIAE   Final    Report Status 06/16/2011 FINAL   Final    Organism ID, Bacteria KLEBSIELLA PNEUMONIAE   Final     Anti-infectives     Start     Dose/Rate Route Frequency Ordered Stop   06/17/11 2200   ciprofloxacin (CIPRO) tablet 500 mg  Status:  Discontinued        500 mg Oral 2 times daily 06/17/11 2111 06/18/11 1602   06/17/11 0800   vancomycin (VANCOCIN) 1,250 mg in sodium chloride 0.9 % 250 mL IVPB        1,250 mg 166.7 mL/hr over 90 Minutes Intravenous Every 8 hours 06/17/11 0232     06/14/11 1200   vancomycin (VANCOCIN) 1,250 mg in sodium chloride 0.9 % 250 mL IVPB  Status:  Discontinued        1,250 mg 166.7 mL/hr  over 90 Minutes Intravenous Every 12 hours 06/14/11 0334 06/17/11 0232   06/14/11 0000   vancomycin (VANCOCIN) 1,500 mg in sodium chloride 0.9 % 500 mL IVPB  Status:  Discontinued        1,500 mg 250 mL/hr over 120 Minutes Intravenous Every 12 hours 06/13/11 2313 06/14/11 0334   06/13/11 2315   piperacillin-tazobactam (ZOSYN) IVPB 3.375 g        3.375 g 12.5 mL/hr over 240 Minutes Intravenous 3 times per day 06/13/11 2313     06/13/11 1700   vancomycin (VANCOCIN) IVPB 1000 mg/200 mL premix        1,000 mg 200 mL/hr over 60 Minutes Intravenous To Emergency Dept 06/13/11 1549 06/13/11 1745   06/13/11 1700   piperacillin-tazobactam (ZOSYN) IVPB 3.375 g        3.375 g 100 mL/hr over 30 Minutes Intravenous To Emergency Dept 06/13/11 1549 06/13/11 1714          Assessment: Vancomycin trough level remains subtherapeutic (11.3 mcg/ml) on regimen of Vancomycin 1250mg  IV q8h.  Pt has received 16:22 dose of Vancomycin 1250mg .  Will adjust regimen with next scheduled dose.  Goal of Therapy:  Vancomycin trough level 15-20 mcg/ml  Plan:  Increase Vancomycin to 1500mg  IV q8h Recheck vancomycin trough level when at steady state Patient continues on Zosyn 3.375gm IV q8h Cipro d/c'ed today.  Maryellen Pile 06/18/2011,4:55 PM

## 2011-06-18 NOTE — Progress Notes (Signed)
1 Day Post-Op  Subjective: Currently uncomfortable on side with dressing from wound down.  WBC is up some. Afebrile,  BP is up some, Objective: Vital signs in last 24 hours: Temp:  [97.5 F (36.4 C)-98.5 F (36.9 C)] 97.7 F (36.5 C) (03/12 0626) Pulse Rate:  [65-103] 65  (03/12 1014) Resp:  [16-25] 17  (03/12 0626) BP: (153-192)/(74-88) 164/74 mmHg (03/12 1045) SpO2:  [95 %-98 %] 98 % (03/12 0626) Last BM Date: 06/16/11  Intake/Output from previous day: 03/11 0701 - 03/12 0700 In: 3755 [P.O.:480; I.V.:2375; IV Piggyback:900] Out: 3675 [Urine:3625; Blood:50] Intake/Output this shift: Total I/O In: 250 [IV Piggyback:250] Out: -   PE:  Large irregular decubitus with dressing down.  It is clean, clotted blood is present, but no significant bleeding issue at this time. He has sensation and can feel you touch him.  Lab Results:   Basename 06/18/11 0355 06/17/11 0530  WBC 11.5* 9.7  HGB 8.8* 8.2*  HCT 29.0* 27.2*  PLT 336 320    BMET  Basename 06/18/11 0355 06/17/11 0530  NA 137 138  K 3.6 3.2*  CL 101 101  CO2 31 32  GLUCOSE 104* 96  BUN 4* 4*  CREATININE 0.47* 0.44*  CALCIUM 8.4 8.1*   PT/INR No results found for this basename: LABPROT:2,INR:2 in the last 72 hours   Studies/Results: Dg Chest Port 1 View  06/17/2011  *RADIOLOGY REPORT*  Clinical Data: PICC line placement  PORTABLE CHEST - 1 VIEW  Comparison: 06/13/2011  Findings: Left PICC line is new in the interval.  Tip position is at the mid SVC level.  Right costophrenic angle is not included on the film, but due to technical difficulties, the repeat x-ray did not make it to PACS.  There is no evidence for overt edema. Telemetry leads overlie the chest.  IMPRESSION: Right PICC line tip projects at the mid SVC level.  Original Report Authenticated By: ERIC A. MANSELL, M.D.    Anti-infectives: Anti-infectives     Start     Dose/Rate Route Frequency Ordered Stop   06/17/11 2200   ciprofloxacin (CIPRO) tablet  500 mg        500 mg Oral 2 times daily 06/17/11 2111     06/17/11 0800   vancomycin (VANCOCIN) 1,250 mg in sodium chloride 0.9 % 250 mL IVPB        1,250 mg 166.7 mL/hr over 90 Minutes Intravenous Every 8 hours 06/17/11 0232     06/14/11 1200   vancomycin (VANCOCIN) 1,250 mg in sodium chloride 0.9 % 250 mL IVPB  Status:  Discontinued        1,250 mg 166.7 mL/hr over 90 Minutes Intravenous Every 12 hours 06/14/11 0334 06/17/11 0232   06/14/11 0000   vancomycin (VANCOCIN) 1,500 mg in sodium chloride 0.9 % 500 mL IVPB  Status:  Discontinued        1,500 mg 250 mL/hr over 120 Minutes Intravenous Every 12 hours 06/13/11 2313 06/14/11 0334   06/13/11 2315  piperacillin-tazobactam (ZOSYN) IVPB 3.375 g       3.375 g 12.5 mL/hr over 240 Minutes Intravenous 3 times per day 06/13/11 2313     06/13/11 1700   vancomycin (VANCOCIN) IVPB 1000 mg/200 mL premix        1,000 mg 200 mL/hr over 60 Minutes Intravenous To Emergency Dept 06/13/11 1549 06/13/11 1745   06/13/11 1700  piperacillin-tazobactam (ZOSYN) IVPB 3.375 g       3.375 g 100 mL/hr over 30  Minutes Intravenous To Emergency Dept 06/13/11 1549 06/13/11 1714         Current Facility-Administered Medications  Medication Dose Route Frequency Provider Last Rate Last Dose  . 0.9 %  sodium chloride infusion   Intravenous Continuous Altha Harm, MD 75 mL/hr at 06/18/11 0809    . albuterol (PROVENTIL HFA;VENTOLIN HFA) 108 (90 BASE) MCG/ACT inhaler 2 puff  2 puff Inhalation Q6H PRN Haydee Monica, MD      . benazepril (LOTENSIN) tablet 40 mg  40 mg Oral Daily Altha Harm, MD   40 mg at 06/18/11 0758  . carisoprodol (SOMA) tablet 700 mg  700 mg Oral QID Rollene Fare, PHARMD   700 mg at 06/18/11 0759  . ciprofloxacin (CIPRO) tablet 500 mg  500 mg Oral BID Altha Harm, MD   500 mg at 06/18/11 0800  . diazepam (VALIUM) tablet 10 mg  10 mg Oral Q6H PRN Haydee Monica, MD   10 mg at 06/18/11 0425  . feeding supplement  (PRO-STAT SUGAR FREE 64) liquid 30 mL  30 mL Oral TID WC Elyse A Shearer, RD   30 mL at 06/18/11 0757  . ferrous sulfate tablet 650 mg  650 mg Oral Daily Haydee Monica, MD   650 mg at 06/18/11 0800  . fluticasone (FLONASE) 50 MCG/ACT nasal spray 2 spray  2 spray Each Nare Daily Haydee Monica, MD   2 spray at 06/18/11 0801  . hydrALAZINE (APRESOLINE) injection 10 mg  10 mg Intravenous Q6H PRN Jinger Neighbors, NP   10 mg at 06/18/11 1018  . HYDROmorphone (DILAUDID) injection 2 mg  2 mg Intravenous Q2H PRN Altha Harm, MD   2 mg at 06/18/11 1004  . iron dextran complex (INFED) 500 mg in sodium chloride 0.9 % 250 mL IVPB  500 mg Intravenous Once Theda Sers, PHARMD   500 mg at 06/17/11 1427  . loratadine (CLARITIN) tablet 10 mg  10 mg Oral Daily Haydee Monica, MD   10 mg at 06/18/11 0801  . mulitivitamin liquid 5 mL  5 mL Oral Daily Haydee Monica, MD   5 mL at 06/18/11 0802  . ondansetron (ZOFRAN) tablet 4 mg  4 mg Oral Q6H PRN Haydee Monica, MD   4 mg at 06/14/11 2134   Or  . ondansetron [facility]) injection 4 mg  4 mg Intravenous Q6H PRN Haydee Monica, MD   4 mg at 06/18/11 0824  . oxyCODONE-acetaminophen (PERCOCET) 5-325 MG per tablet 2 tablet  2 tablet Oral Q4H PRN Altha Harm, MD   2 tablet at 06/18/11 1059   And  . oxyCODONE (Oxy IR/ROXICODONE) immediate release tablet 10 mg  10 mg Oral Q4H PRN Altha Harm, MD   10 mg at 06/18/11 4098  . piperacillin-tazobactam (ZOSYN) IVPB 3.375 g  3.375 g Intravenous Q8H Haydee Monica, MD   3.375 g at 06/18/11 1191  . potassium chloride SA (K-DUR,KLOR-CON) CR tablet 40 mEq  40 mEq Oral Q2H Altha Harm, MD   40 mEq at 06/18/11 0323  . pyridOXINE (VITAMIN B-6) tablet 50 mg  50 mg Oral Daily Haydee Monica, MD   50 mg at 06/18/11 0802  . sodium chloride 0.9 % injection 10-40 mL  10-40 mL Intracatheter PRN Haydee Monica, MD   10 mL at 06/18/11 0353  . sodium chloride 0.9 % injection 3 mL  3 mL Intravenous Q12H Rachal Cliffton Asters,  MD   3 mL at 06/15/11 2214  . vancomycin (VANCOCIN) 1,250 mg in sodium chloride 0.9 % 250 mL IVPB  1,250 mg Intravenous Q8H Haydee Monica, MD   1,250 mg at 06/18/11 1610  . DISCONTD: chlorhexidine (HIBICLENS) 4 % liquid 1 application  1 application Topical Once Ardeth Sportsman, MD      . DISCONTD: HYDROmorphone (DILAUDID) injection 0.25-0.5 mg  0.25-0.5 mg Intravenous Q5 min PRN Jill Side, MD   0.5 mg at 06/17/11 1831   Facility-Administered Medications Ordered in Other Encounters  Medication Dose Route Frequency Provider Last Rate Last Dose  . DISCONTD: droperidol (INAPSINE) injection    PRN Catalina Pizza, CRNA   0.625 mg at 06/17/11 1735  . DISCONTD: fentaNYL (SUBLIMAZE) injection    PRN Catalina Pizza, CRNA   50 mcg at 06/17/11 1747  . DISCONTD: glycopyrrolate (ROBINUL) injection    PRN Catalina Pizza, CRNA   0.6 mg at 06/17/11 1730  . DISCONTD: lactated ringers infusion    Continuous PRN Catalina Pizza, CRNA      . DISCONTD: lidocaine (cardiac) 100 mg/31ml (XYLOCAINE) 20 MG/ML injection 2%    PRN Catalina Pizza, CRNA   100 mg at 06/17/11 1653  . DISCONTD: midazolam (VERSED) 5 MG/5ML injection    PRN Catalina Pizza, CRNA   2 mg at 06/17/11 1645  . DISCONTD: neostigmine (PROSTIGMINE) injection   Intravenous PRN Catalina Pizza, CRNA   4 mg at 06/17/11 1730  . DISCONTD: ondansetron (ZOFRAN) injection    PRN Catalina Pizza, CRNA   4 mg at 06/17/11 1735  . DISCONTD: propofol (DIPRIVAN) 10 MG/ML infusion    PRN Catalina Pizza, CRNA   200 mg at 06/17/11 1653  . DISCONTD: rocuronium (ZEMURON) injection    PRN Catalina Pizza, CRNA   20 mg at 06/17/11 1709  . DISCONTD: succinylcholine (ANECTINE) injection    PRN Catalina Pizza, CRNA   100 mg at 06/17/11 1653    Assessment/Plan Necrotic sacral decubitus wound s/pDebridement of necrotic sacral decubitus wound 06/17/11. Paraplegia following spinal cord injury UTI (lower urinary tract  infection) Malnourished Anemia Hyponatremia Hypertension  .  Coronary artery disease   Plan:  He is on wet to dry dressing changes BID, will ask hydro Rx to see and start tomorrow.   LOS: 5 days    Jahzion Brogden 06/18/2011

## 2011-06-18 NOTE — Progress Notes (Signed)
Subjective: Patient states that his pain is still poorly controlled. He rates his pain as a 6/10 at present after her pain medications.  Interval history the patient tolerated the debridement well yesterday. I have spoken with general surgery and he recommended the patient should have wet-to-dry dressings at this time and then follow up at the wound care center for his usual specialized dressings. The patient reports that he uses bug balm on his wound which afforded him the greatest success in healing. I will defer to his wound care center for specialty dressings for this patient.    Objective: Filed Vitals:   06/18/11 1014 06/18/11 1045 06/18/11 1350 06/18/11 2102  BP: 162/77 164/74 172/81 190/84  Pulse: 65  70 84  Temp:   98.4 F (36.9 C) 98.4 F (36.9 C)  TempSrc:   Oral Oral  Resp:   18 19  Height:      Weight:      SpO2:   93% 96%   Weight change:   Intake/Output Summary (Last 24 hours) at 06/18/11 2130 Last data filed at 06/18/11 1932  Gross per 24 hour  Intake   3205 ml  Output   1901 ml  Net   1304 ml    General: Alert, awake, oriented x3, in no acute distress.  HEENT: [place]/AT PEERL, EOMI Neck: Trachea midline,  no masses, no thyromegal,y no JVD, no carotid bruit Oropharynx:  Moist, No exudate/ erythema/lesions.  Heart: Regular rate and rhythm, without murmurs, rubs, gallops, PMI non-displaced, no heaves or thrills on palpation.  Lungs: Clear to auscultation, no wheezing or rhonchi noted. No increased vocal fremitus resonant to percussion  Abdomen: Soft, nontender, nondistended, positive bowel sounds, no masses no hepatosplenomegaly noted..  Neuro: No focal neurological deficits noted cranial nerves II through XII grossly intact. DTRs 2+ bilaterally upper and lower extremities. Strength 5/5 in bilateral upper extremities. Pt is paraplegic. Musculoskeletal: No warm swelling or erythema around joints, no spinal tenderness noted. Skin: Large stage IV decubitus ulcer at the  sacral region. Patient also has a stage IV pressure ulcer on the left heel.  Lab Results:  Basename 06/18/11 0355 06/17/11 0530  NA 137 138  K 3.6 3.2*  CL 101 101  CO2 31 32  GLUCOSE 104* 96  BUN 4* 4*  CREATININE 0.47* 0.44*  CALCIUM 8.4 8.1*  MG -- --  PHOS -- --   No results found for this basename: AST:2,ALT:2,ALKPHOS:2,BILITOT:2,PROT:2,ALBUMIN:2 in the last 72 hours No results found for this basename: LIPASE:2,AMYLASE:2 in the last 72 hours  Basename 06/18/11 0355 06/17/11 0530 06/16/11 0445  WBC 11.5* 9.7 --  NEUTROABS 9.0* -- 7.7  HGB 8.8* 8.2* --  HCT 29.0* 27.2* --  MCV 80.1 80.2 --  PLT 336 320 --   No results found for this basename: CKTOTAL:3,CKMB:3,CKMBINDEX:3,TROPONINI:3 in the last 72 hours No components found with this basename: POCBNP:3 No results found for this basename: DDIMER:2 in the last 72 hours No results found for this basename: HGBA1C:2 in the last 72 hours No results found for this basename: CHOL:2,HDL:2,LDLCALC:2,TRIG:2,CHOLHDL:2,LDLDIRECT:2 in the last 72 hours No results found for this basename: TSH,T4TOTAL,FREET3,T3FREE,THYROIDAB in the last 72 hours No results found for this basename: VITAMINB12:2,FOLATE:2,FERRITIN:2,TIBC:2,IRON:2,RETICCTPCT:2 in the last 72 hours  Micro Results: Recent Results (from the past 240 hour(s))  CULTURE, BLOOD (ROUTINE X 2)     Status: Normal (Preliminary result)   Collection Time   06/13/11  4:47 PM      Component Value Range Status Comment  Specimen Description BLOOD RIGHT ARM   Final    Special Requests BOTTLES DRAWN AEROBIC AND ANAEROBIC   Final    Culture  Setup Time [id number]   Final    Culture     Final    Value:        BLOOD CULTURE RECEIVED NO GROWTH TO DATE CULTURE WILL BE HELD FOR 5 DAYS BEFORE ISSUING A FINAL NEGATIVE REPORT   Report Status PENDING   Incomplete   CULTURE, BLOOD (ROUTINE X 2)     Status: Normal (Preliminary result)   Collection Time   06/13/11  4:48 PM      Component Value  Range Status Comment   Specimen Description BLOOD LEFT ARM   Final    Special Requests BOTTLES DRAWN AEROBIC AND ANAEROBIC   Final    Culture  Setup Time [id number]   Final    Culture     Final    Value:        BLOOD CULTURE RECEIVED NO GROWTH TO DATE CULTURE WILL BE HELD FOR 5 DAYS BEFORE ISSUING A FINAL NEGATIVE REPORT   Report Status PENDING   Incomplete   WOUND CULTURE     Status: Normal   Collection Time   06/13/11  4:48 PM      Component Value Range Status Comment   Specimen Description PERIRECTAL   Final    Special Requests NONE   Final    Gram Stain     Final    Value: NO WBC SEEN     NO SQUAMOUS EPITHELIAL CELLS SEEN     RARE GRAM POSITIVE COCCI IN PAIRS     RARE GRAM NEGATIVE RODS   Culture     Final    Value: Multiple Species Consistent With Normal Fecal Flora   Report Status 06/16/2011 FINAL   Final   URINE CULTURE     Status: Normal   Collection Time   06/13/11  5:06 PM      Component Value Range Status Comment   Specimen Description URINE, CATHETERIZED   Final    Special Requests NONE   Final    Culture  Setup Time [id number]   Final    Colony Count 4,000 COLONIES/ML   Final    Culture KLEBSIELLA PNEUMONIAE   Final    Report Status 06/16/2011 FINAL   Final    Organism ID, Bacteria KLEBSIELLA PNEUMONIAE   Final     Studies/Results: Ct Abdomen Pelvis W Contrast  06/13/2011  *RADIOLOGY REPORT*  Clinical Data: Bleeding sacral decubitus ulcer.  CT ABDOMEN AND PELVIS WITH CONTRAST  Technique:  Multidetector CT imaging of the abdomen and pelvis was performed following the standard protocol during bolus administration of intravenous contrast.  Contrast: OMNIPAQUE IOHEXOL 300 MG/ML IJ SOLN  Comparison: CT scan dated 02/18/2005  Findings: The liver, spleen, adrenal glands, and left kidney are normal.  Gallbladder has been removed.  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  There is an 11.6 cm simple appearing cyst in the  lateral aspect of the right kidney.  The bowel appears normal. Foley catheter is in place in the bladder.  There is a prominent sacral decubitus ulcer in the midline.  There is adjacent cellulitis.  Air is seen in the soft tissues in the right buttock adjacent to the gluteal crease consistent with infection in the soft tissues.  There is also air in the subcutaneous fat  of the perineum on images 97 through 99.  There is an abscess which tracks into the right buttock superficial to the right gluteus medius muscle.  There is air and a tiny amount of pus within this abscess. This appears to extend to the sacral decubitus ulcer.  Chronic sclerosis of the posterior aspect of the sacrum without discrete osteomyelitis.  IMPRESSION:  1.  Prominent sacral decubitus ulcer.  There is an abscess was extends from the right side of the sacral decubitus ulcer into the right buttock with a small amount of air and pus within the abscess.  This is approximately 10 cm in length with only a 18 mm in diameter. 2. No acute intra-abdominal abnormality.  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 These are usually not aggressive lesions.  Original Report Authenticated By: Gwynn Burly, M.D.   Dg Chest Port 1 View  06/13/2011  *RADIOLOGY REPORT*  Clinical Data: Shortness of breath and cough.  PORTABLE CHEST - 1 VIEW  Comparison: 02/09/2008  Findings: Heart size and vascularity are normal and the lungs are clear.  No osseous abnormality.  IMPRESSION: Normal chest.  Original Report Authenticated By: Gwynn Burly, M.D.    Medications: I have reviewed the patient's current medications. Scheduled Meds:    . benazepril  40 mg Oral Daily  . carisoprodol  700 mg Oral QID  . feeding supplement  30 mL Oral TID WC  . ferrous sulfate  650 mg Oral Daily  . fluticasone  2 spray Each Nare Daily  .  HYDROmorphone (DILAUDID) injection  1 mg Intravenous Once  . loratadine  10  mg Oral Daily  . mulitivitamin  5 mL Oral Daily  . piperacillin-tazobactam (ZOSYN)  IV  3.375 g Intravenous Q8H  . potassium chloride  40 mEq Oral Q2H  . pyridOXINE  50 mg Oral Daily  . sodium chloride  3 mL Intravenous Q12H  . vancomycin  1,500 mg Intravenous Q8H  . DISCONTD: ciprofloxacin  500 mg Oral BID  . DISCONTD: vancomycin  1,250 mg Intravenous Q8H   Continuous Infusions:    . sodium chloride 75 mL/hr at 06/18/11 0809   PRN Meds:.albuterol, diazepam, hydrALAZINE, HYDROmorphone, ondansetron (ZOFRAN) IV, ondansetron, oxyCODONE, oxyCODONE-acetaminophen, promethazine, sodium chloride, DISCONTD: HYDROmorphone Assessment/Plan: Patient Active Hospital Problem List: Decubitus ulcer of buttock, stage 4 (06/13/2011)   Assessment: Pt underwent surgical wound debridement yesterday. At present he will continue with wet to dry dressings. Will continue vancomycin and Zosyn day #5. Patient will likely be a total of 10-14 days of IV antibiotics. I anticipate that the patient will likely be able to discharge within the next 48 hours with PICC line for IV and myotic.  Anemia (06/13/2011)   Assessment: Serum iron less than 10. IV iron completed. Hemoglobin stable at 8.8.    Hyponatremia (06/13/2011)   Assessment: This is improved with IV fluids.    Paraplegia following spinal cord injury (06/13/2011)   Assessment: Chronic condition contributing to his decubitus ulcers.    Malnourished (06/13/2011)   Assessment: A dietary consult has been obtained however the patient feels he may need to speak with the nutritionist prior to discharge from the hospital again and with his wife present.  Klebsiella UTI (3/1102013)   Assessment: Pt on Zosyn to which Klebsiella is sensitive.  Hypokalemia (06/17/2011)   Assessment: Repleted  Chronic Pain   Assessment: The patient has chronic pain secondary to his chronic wounds in his paraplegia.  He is a man of large stature and requires upwards of 3 mg of Dilaudid to  adequately control his pain. He will likely need to have his pain medications adjusted to include a long-acting and short-acting for breakthrough in light of his recent debridement.     LOS: 5 days

## 2011-06-18 NOTE — Progress Notes (Signed)
Patient seen.  Operative findings and intervention discussed.  Continue wet to dry dressing changes for now.  He has used "bag balm" in the past with successful results.

## 2011-06-19 LAB — CBC
HCT: 27.6 % — ABNORMAL LOW (ref 39.0–52.0)
MCHC: 29.7 g/dL — ABNORMAL LOW (ref 30.0–36.0)
MCV: 81.9 fL (ref 78.0–100.0)
Platelets: 319 10*3/uL (ref 150–400)
RDW: 16.6 % — ABNORMAL HIGH (ref 11.5–15.5)
WBC: 7.2 10*3/uL (ref 4.0–10.5)

## 2011-06-19 LAB — DIFFERENTIAL
Basophils Absolute: 0 10*3/uL (ref 0.0–0.1)
Basophils Relative: 0 % (ref 0–1)
Eosinophils Relative: 2 % (ref 0–5)
Lymphocytes Relative: 23 % (ref 12–46)
Monocytes Absolute: 0.6 10*3/uL (ref 0.1–1.0)
Neutro Abs: 4.8 10*3/uL (ref 1.7–7.7)

## 2011-06-19 LAB — BASIC METABOLIC PANEL
Calcium: 8.1 mg/dL — ABNORMAL LOW (ref 8.4–10.5)
Creatinine, Ser: 0.49 mg/dL — ABNORMAL LOW (ref 0.50–1.35)
GFR calc Af Amer: >90 mL/min (ref >90)
Sodium: 136 mEq/L (ref 135–145)

## 2011-06-19 MED ORDER — METOPROLOL TARTRATE 12.5 MG HALF TABLET
12.5000 mg | ORAL_TABLET | Freq: Two times a day (BID) | ORAL | Status: DC
  Administered 2011-06-19 – 2011-06-21 (×4): 12.5 mg via ORAL
  Filled 2011-06-19 (×7): qty 1

## 2011-06-19 NOTE — Progress Notes (Addendum)
Pt refused reinforcement of dressing on left foot. Will continue to monitor pt.  Pt also refused to be weighed via lift this morning, the bed scale on the bariatric air bed isn't functioning properly. Thanks, will continue to monitor pt.

## 2011-06-19 NOTE — Progress Notes (Signed)
[name] is a 61 y.o. male patient admitted with bleeding, possibly infected sacral decubitus ulcer, stage 4. Patient on iv abx, and is s/p debridement. Appreciate surgical input.  SUBJECTIVE Feels ok but says pain is more. He would like the soma given as scheduled.   1. Decubitus ulcer of buttock   2. Cellulitis, gluteal   3. Gluteal abscess     Past Medical History  Diagnosis Date  . Hypertension   . Coronary artery disease    Current Facility-Administered Medications  Medication Dose Route Frequency Provider Last Rate Last Dose  . 0.9 %  sodium chloride infusion   Intravenous Continuous Salina Stanfield, MD 75 mL/hr at 06/19/11 0700    . albuterol (PROVENTIL HFA;VENTOLIN HFA) 108 (90 BASE) MCG/ACT inhaler 2 puff  2 puff Inhalation Q6H PRN Haydee Monica, MD      . benazepril (LOTENSIN) tablet 40 mg  40 mg Oral Daily Altha Harm, MD   40 mg at 06/19/11 1027  . carisoprodol (SOMA) tablet 700 mg  700 mg Oral QID Rollene Fare, PHARMD   700 mg at 06/19/11 1352  . diazepam (VALIUM) tablet 10 mg  10 mg Oral Q6H PRN Haydee Monica, MD   10 mg at 06/18/11 0425  . feeding supplement (PRO-STAT SUGAR FREE 64) liquid 30 mL  30 mL Oral TID WC Elyse A Shearer, RD   30 mL at 06/19/11 1159  . ferrous sulfate tablet 650 mg  650 mg Oral Daily Haydee Monica, MD   650 mg at 06/19/11 1027  . fluticasone (FLONASE) 50 MCG/ACT nasal spray 2 spray  2 spray Each Nare Daily Haydee Monica, MD   2 spray at 06/19/11 1028  . hydrALAZINE (APRESOLINE) injection 10 mg  10 mg Intravenous Q6H PRN Jinger Neighbors, NP   10 mg at 06/18/11 2128  . HYDROmorphone (DILAUDID) injection 1 mg  1 mg Intravenous Once Altha Harm, MD   1 mg at 06/18/11 1627  . HYDROmorphone (DILAUDID) injection 3 mg  3 mg Intravenous Q2H PRN Altha Harm, MD   3 mg at 06/19/11 1351  . loratadine (CLARITIN) tablet 10 mg  10 mg Oral Daily Haydee Monica, MD   10 mg at 06/19/11 1024  . metoprolol tartrate (LOPRESSOR) tablet  12.5 mg  12.5 mg Oral BID Coren Sagan, MD      . mulitivitamin liquid 5 mL  5 mL Oral Daily Haydee Monica, MD   5 mL at 06/19/11 1028  . ondansetron (ZOFRAN) tablet 4 mg  4 mg Oral Q6H PRN Haydee Monica, MD   4 mg at 06/14/11 2134   Or  . ondansetron (ZOFRAN) injection 4 mg  4 mg Intravenous Q6H PRN Haydee Monica, MD   4 mg at 06/19/11 1025  . oxyCODONE-acetaminophen (PERCOCET) 5-325 MG per tablet 2 tablet  2 tablet Oral Q4H PRN Paulita Licklider, MD   2 tablet at 06/19/11 1354   And  . oxyCODONE (Oxy IR/ROXICODONE) immediate release tablet 10 mg  10 mg Oral Q4H PRN Rayni Nemitz, MD   10 mg at 06/19/11 1354  . piperacillin-tazobactam (ZOSYN) IVPB 3.375 g  3.375 g Intravenous Q8H Haydee Monica, MD   3.375 g at 06/19/11 1352  . promethazine (PHENERGAN) injection 25 mg  25 mg Intravenous Q6H PRN Haydee Monica, MD   25 mg at 06/18/11 2045  . pyridOXINE (VITAMIN B-6) tablet 50 mg  50 mg Oral Daily Rachal  Cliffton Asters, MD   50 mg at 06/19/11 1027  . sodium chloride 0.9 % injection 10-40 mL  10-40 mL Intracatheter PRN Haydee Monica, MD   10 mL at 06/18/11 0353  . sodium chloride 0.9 % injection 3 mL  3 mL Intravenous Q12H Haydee Monica, MD   3 mL at 06/15/11 2214  . vancomycin (VANCOCIN) 1,500 mg in sodium chloride 0.9 % 500 mL IVPB  1,500 mg Intravenous Q8H Leann Trefz Poindexter, PHARMD   1,500 mg at 06/19/11 0844  . DISCONTD: ciprofloxacin (CIPRO) tablet 500 mg  500 mg Oral BID Altha Harm, MD   500 mg at 06/18/11 0800  . DISCONTD: HYDROmorphone (DILAUDID) injection 2 mg  2 mg Intravenous Q2H PRN Altha Harm, MD   2 mg at 06/18/11 1523  . DISCONTD: vancomycin (VANCOCIN) 1,250 mg in sodium chloride 0.9 % 250 mL IVPB  1,250 mg Intravenous Q8H Haydee Monica, MD   1,250 mg at 06/18/11 1622   Allergies  Allergen Reactions  . Ciprofloxacin   . Clonidine Derivatives   . Darvocet (Propoxyphene N-Acetaminophen)   . Decadron (Dexamethasone)   . Lipitor (Atorvastatin Calcium)   .  Prednisolone    Principal Problem:  *Decubitus ulcer of buttock, stage 4 Active Problems:  Hyponatremia  Paraplegia following spinal cord injury  UTI (lower urinary tract infection)  Anemia  Malnourished   Vital signs in last 24 hours: Temp:  [98.3 F (36.8 C)-98.8 F (37.1 C)] 98.8 F (37.1 C) (03/13 1300) Pulse Rate:  [83-88] 88  (03/13 1300) Resp:  [16-20] 16  (03/13 1300) BP: (161-190)/(78-96) 181/96 mmHg (03/13 1300) SpO2:  [94 %-96 %] 94 % (03/13 1300) Weight change:  Last BM Date: 06/18/11  Intake/Output from previous day: 03/12 0701 - 03/13 0700 In: 2900 [I.V.:1800; IV Piggyback:1100] Out: 1676 [Urine:1675; Stool:1] Intake/Output this shift: Total I/O In: 240 [P.O.:240] Out: 2200 [Urine:2200]  Lab Results:  [facility] 06/19/11 0615 06/18/11 0355  WBC 7.2 11.5*  HGB 8.2* 8.8*  HCT 27.6* 29.0*  PLT 319 336   BMET  Basename 06/19/11 0615 06/18/11 0355  NA 136 137  K 3.5 3.6  CL 102 101  CO2 31 31  GLUCOSE 106* 104*  BUN 4* 4*  CREATININE 0.49* 0.47*  CALCIUM 8.1* 8.4    Studies/Results: No results found.  Medications: I have reviewed the patient's current medications.   Physical exam GENERAL- alert HEAD- normal atraumatic, no neck masses, normal thyroid, no jvd RESPIRATORY- appears well, vitals normal, no respiratory distress, acyanotic, normal RR, ear and throat exam is normal, neck free of mass or lymphadenopathy, chest clear, no wheezing, crepitations, rhonchi, normal symmetric air entry CVS- regular rate and rhythm, S1, S2 normal, no murmur, click, rub or gallop ABDOMEN- abdomen is soft without significant tenderness, masses, organomegaly or guarding NEURO- paraplegic. EXTREMITIES- sacral decubitus ulcer. Clean dressing, no discharge or bleeding.  Plan   *Decubitus ulcer of buttock, stage 4- s/p debridement. To continue abx to complete 2 weeks. Appreciate surgical/WOC input.  *Malignat htn- uncontrolled. Add lopressor. Possible pain  element.  *Paraplegia following spinal cord injury- supportive care  * Anemia- ?acute on chronic from bleeding ulcer. Stable Hb, monitor.  *Malnourished Eventually home.    Cotton Beckley 06/19/2011 3:20 PM Pager: [phone].

## 2011-06-19 NOTE — Progress Notes (Signed)
Physical Therapy Wound Treatment Patient Details  Name: [name] MRN: [id number] Date of Birth: March 09, 1951  Today's Date: 06/19/2011 Time:11:00  -11:55    Subjective  Subjective: pt reports he has pain in back and diffuculty rolling Patient and Family Stated Goals: pt wants to go home Date of Onset:  (pt has sacral pressure ulcer for 4 years) Prior Treatments: pt has had many treatments including a wound vac that did not work, received treatment at the wound care center  Pain Score: Pain Score:  (did not rate)  Wound Assessment  Pressure Ulcer 06/13/11 Unstageable - Full thickness tissue loss in which the base of the ulcer is covered by slough (yellow, tan, gray, green or Nino Amano) and/or eschar (tan, Kennadie Brenner or black) in the wound bed. large pressure ulcer with foul odor and unstage (Active)  State of Healing Non-healing 06/19/2011 12:01 PM  Site / Wound Assessment Clean 06/19/2011 12:01 PM  % Wound base Red or Granulating 85% 06/19/2011 12:01 PM  % Wound base Yellow 15% 06/19/2011 12:01 PM  Peri-wound Assessment Intact 06/19/2011 12:01 PM  Wound Length (cm) 24 cm 06/13/2011 10:45 PM  Wound Width (cm) 13 cm 06/19/2011 12:01 PM  Wound Depth (cm) 4 cm 06/19/2011 12:01 PM  Tunneling (cm) 6 06/19/2011 12:01 PM  Margins Unattacted edges (unapproximated) 06/19/2011 12:01 PM  Drainage Amount Copious 06/19/2011 12:01 PM  Drainage Description Serous 06/19/2011 12:01 PM  Treatment Hydrotherapy (Pulse lavage);Cleansed;Packing (Saline gauze) 06/19/2011 12:01 PM  Dressing Type ABD;Moist to dry;Paper tape 06/19/2011 12:01 PM  Dressing Changed 06/18/2011  8:45 PM     Pressure Ulcer 06/13/11 Unstageable - Full thickness tissue loss in which the base of the ulcer is covered by slough (yellow, tan, gray, green or Meldon Hanzlik) and/or eschar (tan, Aprel Egelhoff or black) in the wound bed. necrotic tissue surrounded by red non-blanchabl (Active)  State of Healing Eschar 06/19/2011 12:01 PM  Site / Wound Assessment Dry;Black 06/19/2011  12:01 PM  % Wound base Red or Granulating 20% 06/19/2011 12:01 PM  % Wound base Yellow 20% 06/19/2011 12:01 PM  % Wound base Black 60% 06/19/2011 12:01 PM  Peri-wound Assessment Erythema (non-blanchable) 06/19/2011 12:01 PM  Wound Length (cm) 6 cm 06/19/2011 12:01 PM  Wound Width (cm) 2 cm 06/19/2011 12:01 PM  Margins Unattacted edges (unapproximated) 06/19/2011 12:01 PM  Drainage Amount Moderate 06/19/2011 12:01 PM  Drainage Description Serous 06/19/2011 12:01 PM  Treatment Hydrotherapy (Pulse lavage);Cleansed 06/19/2011 12:01 PM  Dressing Type ABD;Gauze (Comment) 06/19/2011 12:01 PM  Dressing Changed 06/19/2011 12:01 PM     Pressure Ulcer 06/14/11 Unstageable - Full thickness tissue loss in which the base of the ulcer is covered by slough (yellow, tan, gray, green or Miyana Mordecai) and/or eschar (tan, Makinzi Prieur or black) in the wound bed. granulation tissue, slough and small amt of esc (Active)  % Wound base Red or Granulating 50% 06/14/2011  8:00 PM  % Wound base Yellow 25% 06/14/2011  8:00 PM  % Wound base Black 25% 06/14/2011  8:00 PM  Peri-wound Assessment Intact 06/14/2011  8:00 PM  Drainage Amount Minimal 06/14/2011  8:00 PM  Drainage Description Sanguineous 06/14/2011  8:00 PM  Dressing Type Moist to moist 06/14/2011  8:00 PM  Dressing Clean;Dry 06/19/2011  8:58 AM     Wound 06/14/11 Abrasion(s) Toe (Comment  which one) Left;Other (Comment) granulaion on top of 1,2,3 toes (Active)  % Wound base Red or Granulating 100% 06/14/2011  8:00 PM  Dressing Type Moist to moist 06/14/2011  8:00 PM  Dressing  Changed Changed 06/14/2011 10:30 AM  Dressing Status New drainage;Clean;Intact 06/19/2011  8:58 AM     Incision 06/17/11 Buttocks Bilateral (Active)  Drainage Amount None 06/17/2011  6:30 PM  Dressing Clean;Dry;Intact 06/17/2011  6:30 PM   Hydrotherapy Pulsed lavage therapy - wound location: sacral, buttocks Pulsed Lavage with Suction (psi): 8 psi Pulsed Lavage with Suction - Normal Saline Used: 1000 mL Pulsed Lavage Tip:  Tip with splash shield   Wound Assessment and Plan  Wound Therapy - Assess/Plan/Recommendations Wound Therapy - Clinical Statement: pt with extensive wound with red base. Expect prolonged healing requiring  frequent dressing changes because of drainage and difficulty getting tape to adhere. Pt will little eschar at this point in large sacral wound, but does have  eschar on buttock wound. Pt also with smaller red wounds on left buttocks---thinfilm appilied.  Wound Therapy - Functional Problem List: paraparesis, pain, difficulty with bed mobility and positioning Factors Delaying/Impairing Wound Healing: Altered sensation;Immobility;Multiple medical problems;Other (comment) (hypoalbuminemia) Hydrotherapy Plan: Dressing change;Pulsatile lavage with suction Wound Therapy - Frequency: 6X / week Wound Therapy - Follow Up Recommendations: Wound Care Center;Home health RN  Wound Therapy Goals- Improve the function of patient's integumentary system by progressing the wound(s) through the phases of wound healing (inflammation - proliferation - remodeling) by: Decrease Necrotic Tissue to: 0 Decrease Necrotic Tissue - Progress: Goal set today Increase Granulation Tissue to: 100% Increase Granulation Tissue - Progress: Goal set today Improve Drainage Characteristics: Min Improve Drainage Characteristics - Progress: Goal set today Patient/Family will be able to : adhere to positioning schedule Patient/Family Instruction Goal - Progress: Goal set today Goals/treatment plan/discharge plan were made with and agreed upon by patient/family: Yes (pt wants to return home vs LTACH) Time For Goal Achievement: 2 weeks Wound Therapy - Potential for Goals: Fair  Goals will be updated until maximal potential achieved or discharge criteria met.  Discharge criteria: when goals achieved, discharge from hospital, MD decision/surgical intervention, no progress towards goals, refusal/missing three consecutive treatments  without notification or medical reason.  Ebony Hail [facility] 06/19/2011, 12:22 PM

## 2011-06-20 ENCOUNTER — Encounter (HOSPITAL_COMMUNITY): Payer: Self-pay | Admitting: *Deleted

## 2011-06-20 LAB — BASIC METABOLIC PANEL
BUN: 4 mg/dL — ABNORMAL LOW (ref 6–23)
CO2: 32 mEq/L (ref 19–32)
Calcium: 8.4 mg/dL (ref 8.4–10.5)
Creatinine, Ser: 0.46 mg/dL — ABNORMAL LOW (ref 0.50–1.35)

## 2011-06-20 LAB — CBC
HCT: 28.7 % — ABNORMAL LOW (ref 39.0–52.0)
MCH: 24.8 pg — ABNORMAL LOW (ref 26.0–34.0)
MCV: 82.7 fL (ref 78.0–100.0)
Platelets: 321 10*3/uL (ref 150–400)
RBC: 3.47 MIL/uL — ABNORMAL LOW (ref 4.22–5.81)
RDW: 17.1 % — ABNORMAL HIGH (ref 11.5–15.5)

## 2011-06-20 LAB — CULTURE, BLOOD (ROUTINE X 2): Culture: NO GROWTH

## 2011-06-20 NOTE — Progress Notes (Signed)
SUBJECTIVE "Ok". Could not sleep last night.   1. Decubitus ulcer of buttock   2. Cellulitis, gluteal   3. Gluteal abscess     Past Medical History  Diagnosis Date  . Hypertension   . Coronary artery disease    Current Facility-Administered Medications  Medication Dose Route Frequency Provider Last Rate Last Dose  . 0.9 %  sodium chloride infusion   Intravenous Continuous Tremain Rucinski, MD 20 mL/hr at 06/20/11 0919    . albuterol (PROVENTIL HFA;VENTOLIN HFA) 108 (90 BASE) MCG/ACT inhaler 2 puff  2 puff Inhalation Q6H PRN Haydee Monica, MD      . benazepril (LOTENSIN) tablet 40 mg  40 mg Oral Daily Altha Harm, MD   40 mg at 06/20/11 1027  . carisoprodol (SOMA) tablet 700 mg  700 mg Oral QID Rollene Fare, PHARMD   700 mg at 06/20/11 1721  . diazepam (VALIUM) tablet 10 mg  10 mg Oral Q6H PRN Haydee Monica, MD   10 mg at 06/20/11 1221  . feeding supplement (PRO-STAT SUGAR FREE 64) liquid 30 mL  30 mL Oral TID WC Elyse A Shearer, RD   30 mL at 06/20/11 1721  . ferrous sulfate tablet 650 mg  650 mg Oral Daily Haydee Monica, MD   650 mg at 06/20/11 1027  . fluticasone (FLONASE) 50 MCG/ACT nasal spray 2 spray  2 spray Each Nare Daily Haydee Monica, MD   2 spray at 06/20/11 1028  . hydrALAZINE (APRESOLINE) injection 10 mg  10 mg Intravenous Q6H PRN Jinger Neighbors, NP   10 mg at 06/20/11 0815  . HYDROmorphone (DILAUDID) injection 3 mg  3 mg Intravenous Q2H PRN Altha Harm, MD   3 mg at 06/20/11 1944  . loratadine (CLARITIN) tablet 10 mg  10 mg Oral Daily Haydee Monica, MD   10 mg at 06/20/11 1027  . metoprolol tartrate (LOPRESSOR) tablet 12.5 mg  12.5 mg Oral BID Brantley Naser, MD   12.5 mg at 06/20/11 1027  . mulitivitamin liquid 5 mL  5 mL Oral Daily Haydee Monica, MD   5 mL at 06/20/11 1028  . ondansetron (ZOFRAN) tablet 4 mg  4 mg Oral Q6H PRN Haydee Monica, MD   4 mg at 06/14/11 2134   Or  . ondansetron (ZOFRAN) injection 4 mg  4 mg Intravenous Q6H PRN Haydee Monica, MD   4 mg at 06/20/11 1944  . oxyCODONE-acetaminophen (PERCOCET) 5-325 MG per tablet 2 tablet  2 tablet Oral Q4H PRN [name] Grigorian, MD   2 tablet at 06/20/11 1809   And  . oxyCODONE (Oxy IR/ROXICODONE) immediate release tablet 10 mg  10 mg Oral Q4H PRN Icarus Partch, MD   10 mg at 06/20/11 1809  . piperacillin-tazobactam (ZOSYN) IVPB 3.375 g  3.375 g Intravenous Q8H Haydee Monica, MD   3.375 g at 06/20/11 1334  . promethazine (PHENERGAN) injection 25 mg  25 mg Intravenous Q6H PRN Haydee Monica, MD   25 mg at 06/20/11 0749  . pyridOXINE (VITAMIN B-6) tablet 50 mg  50 mg Oral Daily Haydee Monica, MD   50 mg at 06/20/11 1027  . sodium chloride 0.9 % injection 10-40 mL  10-40 mL Intracatheter PRN Haydee Monica, MD   10 mL at 06/18/11 0353  . sodium chloride 0.9 % injection 3 mL  3 mL Intravenous Q12H Haydee Monica, MD   3 mL at 06/15/11 2214  .  vancomycin (VANCOCIN) 1,500 mg in sodium chloride 0.9 % 500 mL IVPB  1,500 mg Intravenous Q8H Leann Trefz Poindexter, PHARMD   1,500 mg at 06/20/11 1727   Allergies  Allergen Reactions  . Ciprofloxacin   . Clonidine Derivatives   . Darvocet (Propoxyphene N-Acetaminophen)   . Decadron (Dexamethasone)   . Lipitor (Atorvastatin Calcium)   . Prednisolone    Principal Problem:  *Decubitus ulcer of buttock, stage 4 Active Problems:  Hyponatremia  Paraplegia following spinal cord injury  UTI (lower urinary tract infection)  Anemia  Malnourished   Vital signs in last 24 hours: Temp:  [97.5 F (36.4 C)-98.6 F (37 C)] 98.6 F (37 C) (03/14 1359) Pulse Rate:  [64-104] 93  (03/14 1359) Resp:  [18-28] 22  (03/14 1359) BP: (139-186)/(72-95) 139/77 mmHg (03/14 1359) SpO2:  [94 %-97 %] 94 % (03/14 1359) Weight change:  Last BM Date: 06/19/11  Intake/Output from previous day: 03/13 0701 - 03/14 0700 In: 3192.5 [P.O.:720; I.V.:872.5; IV Piggyback:1600] Out: 5000 [Urine:5000] Intake/Output this shift:    Lab Results:  Basename 06/20/11  0540 06/19/11 0615  WBC 8.1 7.2  HGB 8.6* 8.2*  HCT 28.7* 27.6*  PLT 321 319   BMET  Basename 06/20/11 0540 06/19/11 0615  NA 135 136  K 3.6 3.5  CL 100 102  CO2 32 31  GLUCOSE 100* 106*  BUN 4* 4*  CREATININE 0.46* 0.49*  CALCIUM 8.4 8.1*    Studies/Results: No results found.  Medications: I have reviewed the patient's current medications.   Physical exam GENERAL- alert HEAD- normal atraumatic, no neck masses, normal thyroid, no jvd RESPIRATORY- appears well, vitals normal, no respiratory distress, acyanotic, normal RR, ear and throat exam is normal, neck free of mass or lymphadenopathy, chest clear, no wheezing, crepitations, rhonchi, normal symmetric air entry CVS- regular rate and rhythm, S1, S2 normal, no murmur, click, rub or gallop ABDOMEN- abdomen is soft without significant tenderness, masses, organomegaly or guarding NEURO- no acute changes. EXTREMITIES- clean left hel ulcer/sacral decubitus ulcer.  Plan  *Decubitus ulcer of buttock, stage 4- No major change, s/p debridement. To continue abx to complete 2 weeks. Appreciate surgical/WOC input.  *Malignat htn- Fluctuating control.  Possible pain element. Monitor. *Paraplegia following spinal cord injury- supportive care  * Anemia- ?acute on chronic from bleeding ulcer. Stable Hb, monitor.  *Malnourished  Hopefully home tomorrow if [facility] with surgery.     Hydie Langan 06/20/2011 7:54 PM Pager: [phone].

## 2011-06-20 NOTE — Progress Notes (Signed)
3 Days Post-Op  Subjective: Very uncomfortable on side  Objective: Vital signs in last 24 hours: Temp:  [98.3 F (36.8 C)-98.8 F (37.1 C)] 98.5 F (36.9 C) (03/14 0800) Pulse Rate:  [64-89] 89  (03/14 0800) Resp:  [16-18] 18  (03/14 0800) BP: (162-187)/(72-96) 176/95 mmHg (03/14 0815) SpO2:  [94 %-97 %] 95 % (03/14 0800) Last BM Date: 06/19/11  Intake/Output from previous day: 03/13 0701 - 03/14 0700 In: 3192.5 [P.O.:720; I.V.:872.5; IV Piggyback:1600] Out: 5000 [Urine:5000] Intake/Output this shift: Total I/O In: -  Out: 2200 [Urine:2200]  PE: Alert, Decubitus:  Dressings removed, the area is very clean and pink with marked improvement.  He has not had good success with Wound Vac in past.There is not allot of room to secure one at this point either.  Lab Results:   Basename 06/20/11 0540 06/19/11 0615  WBC 8.1 7.2  HGB 8.6* 8.2*  HCT 28.7* 27.6*  PLT 321 319    BMET  Basename 06/20/11 0540 06/19/11 0615  NA 135 136  K 3.6 3.5  CL 100 102  CO2 32 31  GLUCOSE 100* 106*  BUN 4* 4*  CREATININE 0.46* 0.49*  CALCIUM 8.4 8.1*   PT/INR No results found for this basename: LABPROT:2,INR:2 in the last 72 hours   Studies/Results: No results found.  Anti-infectives: Anti-infectives     Start     Dose/Rate Route Frequency Ordered Stop   06/18/11 2359   vancomycin (VANCOCIN) 1,500 mg in sodium chloride 0.9 % 500 mL IVPB        1,500 mg 250 mL/hr over 120 Minutes Intravenous Every 8 hours 06/18/11 1700     06/17/11 2200   ciprofloxacin (CIPRO) tablet 500 mg  Status:  Discontinued        500 mg Oral 2 times daily 06/17/11 2111 06/18/11 1602   06/17/11 0800   vancomycin (VANCOCIN) 1,250 mg in sodium chloride 0.9 % 250 mL IVPB  Status:  Discontinued        1,250 mg 166.7 mL/hr over 90 Minutes Intravenous Every 8 hours 06/17/11 0232 06/18/11 1659   06/14/11 1200   vancomycin (VANCOCIN) 1,250 mg in sodium chloride 0.9 % 250 mL IVPB  Status:  Discontinued        1,250 mg 166.7 mL/hr over 90 Minutes Intravenous Every 12 hours 06/14/11 0334 06/17/11 0232   06/14/11 0000   vancomycin (VANCOCIN) 1,500 mg in sodium chloride 0.9 % 500 mL IVPB  Status:  Discontinued        1,500 mg 250 mL/hr over 120 Minutes Intravenous Every 12 hours 06/13/11 2313 06/14/11 0334   06/13/11 2315  piperacillin-tazobactam (ZOSYN) IVPB 3.375 g       3.375 g 12.5 mL/hr over 240 Minutes Intravenous 3 times per day 06/13/11 2313     06/13/11 1700   vancomycin (VANCOCIN) IVPB 1000 mg/200 mL premix        1,000 mg 200 mL/hr over 60 Minutes Intravenous To Emergency Dept 06/13/11 1549 06/13/11 1745   06/13/11 1700  piperacillin-tazobactam (ZOSYN) IVPB 3.375 g       3.375 g 100 mL/hr over 30 Minutes Intravenous To Emergency Dept 06/13/11 1549 06/13/11 1714         Current Facility-Administered Medications  Medication Dose Route Frequency Provider Last Rate Last Dose  . 0.9 %  sodium chloride infusion   Intravenous Continuous Simbiso Ranga, MD 20 mL/hr at 06/20/11 0919    . albuterol (PROVENTIL HFA;VENTOLIN HFA) 108 (90 BASE) MCG/ACT inhaler 2  puff  2 puff Inhalation Q6H PRN Haydee Monica, MD      . benazepril (LOTENSIN) tablet 40 mg  40 mg Oral Daily Altha Harm, MD   40 mg at 06/19/11 1027  . carisoprodol (SOMA) tablet 700 mg  700 mg Oral QID Rollene Fare, PHARMD   700 mg at 06/19/11 2107  . diazepam (VALIUM) tablet 10 mg  10 mg Oral Q6H PRN Haydee Monica, MD   10 mg at 06/19/11 2107  . feeding supplement (PRO-STAT SUGAR FREE 64) liquid 30 mL  30 mL Oral TID WC Elyse A Shearer, RD   30 mL at 06/20/11 0827  . ferrous sulfate tablet 650 mg  650 mg Oral Daily Haydee Monica, MD   650 mg at 06/19/11 1027  . fluticasone (FLONASE) 50 MCG/ACT nasal spray 2 spray  2 spray Each Nare Daily Haydee Monica, MD   2 spray at 06/19/11 1028  . hydrALAZINE (APRESOLINE) injection 10 mg  10 mg Intravenous Q6H PRN Jinger Neighbors, NP   10 mg at 06/20/11 0815  . HYDROmorphone  (DILAUDID) injection 3 mg  3 mg Intravenous Q2H PRN Altha Harm, MD   3 mg at 06/20/11 0741  . loratadine (CLARITIN) tablet 10 mg  10 mg Oral Daily Haydee Monica, MD   10 mg at 06/19/11 1024  . metoprolol tartrate (LOPRESSOR) tablet 12.5 mg  12.5 mg Oral BID Simbiso Ranga, MD   12.5 mg at 06/19/11 1708  . mulitivitamin liquid 5 mL  5 mL Oral Daily Haydee Monica, MD   5 mL at 06/19/11 1028  . ondansetron (ZOFRAN) tablet 4 mg  4 mg Oral Q6H PRN Haydee Monica, MD   4 mg at 06/14/11 2134   Or  . ondansetron [facility]) injection 4 mg  4 mg Intravenous Q6H PRN Haydee Monica, MD   4 mg at 06/20/11 [phone]  . oxyCODONE-acetaminophen (PERCOCET) 5-325 MG per tablet 2 tablet  2 tablet Oral Q4H PRN Conley Canal, MD   2 tablet at 06/20/11 0928   And  . oxyCODONE (Oxy IR/ROXICODONE) immediate release tablet 10 mg  10 mg Oral Q4H PRN Simbiso Ranga, MD   10 mg at 06/20/11 0928  . piperacillin-tazobactam (ZOSYN) IVPB 3.375 g  3.375 g Intravenous Q8H Haydee Monica, MD   3.375 g at 06/20/11 0531  . promethazine (PHENERGAN) injection 25 mg  25 mg Intravenous Q6H PRN Haydee Monica, MD   25 mg at 06/20/11 0749  . pyridOXINE (VITAMIN B-6) tablet 50 mg  50 mg Oral Daily Haydee Monica, MD   50 mg at 06/19/11 1027  . sodium chloride 0.9 % injection 10-40 mL  10-40 mL Intracatheter PRN Haydee Monica, MD   10 mL at 06/18/11 0353  . sodium chloride 0.9 % injection 3 mL  3 mL Intravenous Q12H Haydee Monica, MD   3 mL at 06/15/11 2214  . vancomycin (VANCOCIN) 1,500 mg in sodium chloride 0.9 % 500 mL IVPB  1,500 mg Intravenous Q8H Leann Trefz Poindexter, PHARMD   1,500 mg at 06/20/11 2130    Assessment/Plan Necrotic sacral decubitus wound s/pDebridement of necrotic sacral decubitus wound 06/17/11.  Paraplegia following spinal cord injury  UTI (lower urinary tract infection)  Malnourished  Anemia  Hyponatremia  Hypertension  Coronary artery disease .    Plan: Continue wet to dry, and hydrotherapy.       LOS: 7 days    Terryon Pineiro  06/20/2011  

## 2011-06-20 NOTE — Anesthesia Postprocedure Evaluation (Signed)
  Anesthesia Post-op Note  Patient: [name]  Procedure(s) Performed: Procedure(s) (LRB): IRRIGATION AND DEBRIDEMENT ABSCESS (N/A)  Patient Location: PACU  Anesthesia Type: General  Level of Consciousness: oriented and sedated  Airway and Oxygen Therapy: Patient Spontanous Breathing and Patient connected to nasal cannula oxygen  Post-op Pain: mild  Post-op Assessment: Post-op Vital signs reviewed  Post-op Vital Signs: stable  Complications: No apparent anesthesia complications

## 2011-06-20 NOTE — Progress Notes (Signed)
Physical Therapy Wound Treatment Patient Details  Name: [name] MRN: [id number] Date of Birth: 1951-02-01  Today's Date: 06/20/2011 Time:  8:45-9:15    Subjective  Subjective: lets get this over with  Pain Score: Pain Score: 10-Worst pain ever  Wound Assessment  Pressure Ulcer 06/13/11 Unstageable - Full thickness tissue loss in which the base of the ulcer is covered by slough (yellow, tan, gray, green or [name]) and/or eschar (tan, [name] or black) in the wound bed. large pressure ulcer with foul odor and unstage (Active)  State of Healing Non-healing 06/20/2011  9:25 AM  Site / Wound Assessment Clean;Red 06/20/2011  9:25 AM  % Wound base Red or Granulating 70% 06/20/2011  9:25 AM  % Wound base Yellow 15% 06/20/2011  9:25 AM  % Wound base Black 15% 06/20/2011  9:25 AM  Peri-wound Assessment Intact 06/20/2011  9:25 AM  Wound Length (cm) 24 cm 06/20/2011  9:25 AM  Wound Width (cm) 13 cm 06/20/2011  9:25 AM  Wound Depth (cm) 4 cm 06/13/2011 10:45 PM  Tunneling (cm) 6 at 1 on the clock face 06/20/2011  9:25 AM  Margins Unattacted edges (unapproximated) 06/20/2011  9:25 AM  Drainage Amount Moderate 06/20/2011  9:25 AM  Drainage Description Serous 06/20/2011  9:25 AM  Treatment Hydrotherapy (Pulse lavage);Packing (Saline gauze) 06/20/2011  9:25 AM  Dressing Type Moist to dry;Gauze (Comment);ABD;Transparent dressing;Tape dressing 06/20/2011  9:25 AM  Dressing Changed 06/20/2011  9:25 AM     Pressure Ulcer 06/13/11 Unstageable - Full thickness tissue loss in which the base of the ulcer is covered by slough (yellow, tan, gray, green or [name]) and/or eschar (tan, [name] or black) in the wound bed. necrotic tissue surrounded by red non-blanchabl (Active)  State of Healing Early/partial granulation 06/20/2011  9:25 AM  Site / Wound Assessment Yellow;Black 06/20/2011  9:25 AM  % Wound base Red or Granulating 20% 06/19/2011 12:01 PM  % Wound base Yellow 20% 06/19/2011 12:01 PM  % Wound base Black 60% 06/19/2011  12:01 PM  Peri-wound Assessment Erythema (non-blanchable) 06/20/2011  9:25 AM  Wound Length (cm) 6 cm 06/19/2011 12:01 PM  Wound Width (cm) 2 cm 06/19/2011 12:01 PM  Tunneling (cm) 0 06/20/2011  9:25 AM  Undermining (cm) 0 06/20/2011  9:25 AM  Margins Unattacted edges (unapproximated) 06/20/2011  9:25 AM  Drainage Amount Moderate 06/20/2011  9:25 AM  Drainage Description Serous 06/20/2011  9:25 AM  Treatment Cleansed;Hydrotherapy (Pulse lavage) 06/20/2011  9:25 AM  Dressing Type ABD;Moist to dry 06/20/2011  9:25 AM  Dressing Changed 06/20/2011  9:25 AM     Pressure Ulcer 06/14/11 Unstageable - Full thickness tissue loss in which the base of the ulcer is covered by slough (yellow, tan, gray, green or [name]) and/or eschar (tan, [name] or black) in the wound bed. granulation tissue, slough and small amt of esc (Active)  % Wound base Red or Granulating 50% 06/14/2011  8:00 PM  % Wound base Yellow 25% 06/14/2011  8:00 PM  % Wound base Black 25% 06/14/2011  8:00 PM  Peri-wound Assessment Intact 06/14/2011  8:00 PM  Drainage Amount Minimal 06/14/2011  8:00 PM  Drainage Description Sanguineous 06/14/2011  8:00 PM  Dressing Type Moist to moist 06/14/2011  8:00 PM  Dressing Clean;Dry 06/19/2011  8:30 PM     Wound 06/14/11 Abrasion(s) Toe (Comment  which one) Left;Other (Comment) granulaion on top of 1,2,3 toes (Active)  % Wound base Red or Granulating 100% 06/14/2011  8:00 PM  Dressing Type Moist  to moist 06/14/2011  8:00 PM  Dressing Changed Changed 06/14/2011 10:30 AM  Dressing Status Intact;Old drainage 06/20/2011  7:50 AM     Incision 06/17/11 Buttocks Bilateral (Active)  Drainage Amount None 06/17/2011  6:30 PM  Dressing Clean;Dry;Intact 06/17/2011  6:30 PM   Hydrotherapy Pulsed lavage therapy - wound location: sacral, buttocks Pulsed Lavage with Suction (psi): 8 psi Pulsed Lavage with Suction - Normal Saline Used: 1000 mL Pulsed Lavage Tip: Tip with splash shield   Wound Assessment and Plan \ Drainage decreased  today.  Wound shows some changes with dry dark area over sacrum and more dots of yellow, but overall still remains very red. Will continue with saline kerlix loosely packed dressing with superficial areas covered with thin film to promote healing in those areas.  3 ABD pads used to provide coverage and absorb drainage with pt rolling to each side to make sure skin prep and hypafix tape can be secured. Unable to secure tape on distal end because of proximity to anus.  Feel pt would benefit from continued wound care at [facility] prior to D/C to home if available to him    Wound Therapy Goals- Improve the function of patient's integumentary system by progressing the wound(s) through the phases of wound healing (inflammation - proliferation - remodeling) by:    Goals will be updated until maximal potential achieved or discharge criteria met.  Discharge criteria: when goals achieved, discharge from hospital, MD decision/surgical intervention, no progress towards goals, refusal/missing three consecutive treatments without notification or medical reason.  Donnetta Hail 06/20/2011, 9:39 AM

## 2011-06-21 MED ORDER — METOPROLOL TARTRATE 12.5 MG HALF TABLET: 25.0000 mg | ORAL_TABLET | Freq: Two times a day (BID) | ORAL | Status: DC

## 2011-06-21 MED ORDER — VANCOMYCIN HCL 1000 MG IV SOLR: 1500.0000 mg | Freq: Two times a day (BID) | INTRAVENOUS | Status: DC

## 2011-06-21 MED ORDER — ONDANSETRON HCL 4 MG PO TABS: 4.0000 mg | ORAL_TABLET | Freq: Four times a day (QID) | ORAL | Status: AC | PRN

## 2011-06-21 MED ORDER — FLORANEX PO PACK
1.0000 g | PACK | Freq: Three times a day (TID) | ORAL | Status: DC
  Administered 2011-06-21: 1 g via ORAL
  Filled 2011-06-21 (×6): qty 1

## 2011-06-21 MED ORDER — PIPERACILLIN-TAZOBACTAM 3.375 G IVPB: 3.3750 g | Freq: Three times a day (TID) | INTRAVENOUS | Status: AC

## 2011-06-21 MED ORDER — BENAZEPRIL HCL 40 MG PO TABS: 40.0000 mg | ORAL_TABLET | Freq: Every day | ORAL | Status: DC

## 2011-06-21 MED ORDER — FLORANEX PO PACK: 1.0000 g | PACK | Freq: Three times a day (TID) | ORAL | Status: DC

## 2011-06-21 NOTE — Discharge Summary (Signed)
DISCHARGE SUMMARY  [name]  MR#: [id number]  DOB:[birth year]  Date of Admission: 06/13/2011 Date of Discharge: 06/21/2011  Attending Physician:Keyira Mondesir  Patient's TKW:[id number],DJMEQA G, MD, MD  Consults:Treatment Team:  Bishop Limbo, MD- Wound Care.  Discharge Diagnoses: Present on Admission:  .Decubitus ulcer of buttock, stage 4 .Hyponatremia .UTI (lower urinary tract infection) due to klebsiella. .Anemia .Malnourished .HTN (hypertension), malignant .Paraplegia.   Medication List  As of 06/21/2011 10:40 AM   STOP taking these medications         clopidogrel 75 MG tablet         TAKE these medications         acetaminophen-codeine 300-60 MG per tablet   Commonly known as: TYLENOL #4   Take 3 tablets by mouth every 4 (four) hours as needed.      albuterol 108 (90 BASE) MCG/ACT inhaler   Commonly known as: PROVENTIL HFA;VENTOLIN HFA   Inhale 2 puffs into the lungs every 6 (six) hours as needed. For shortness of breath      benazepril 40 MG tablet   Commonly known as: LOTENSIN   Take 1 tablet (40 mg total) by mouth daily.      carisoprodol 350 MG tablet   Commonly known as: SOMA   Take 700 mg by mouth every 6 (six) hours.      cetirizine 10 MG tablet   Commonly known as: ZYRTEC   Take 10 mg by mouth daily.      diazepam 5 MG tablet   Commonly known as: VALIUM   Take 10 mg by mouth every 6 (six) hours as needed.      ferrous sulfate 325 (65 FE) MG tablet   Take 650 mg by mouth daily.      furosemide 20 MG tablet   Commonly known as: LASIX   Take 40 mg by mouth 2 (two) times daily as needed.      lactobacillus Pack   Take 1 packet (1 g total) by mouth 3 (three) times daily with meals.      metoprolol tartrate 12.5 mg Tabs   Commonly known as: LOPRESSOR   Take 1 tablet (25 mg total) by mouth 2 (two) times daily.      mulitivitamin Liqd   Take 5 mLs by mouth daily.      nitroGLYCERIN 0.4 MG SL tablet   Commonly known as: NITROSTAT   Place 0.4 mg  under the tongue every 5 (five) minutes as needed.      oxyCODONE-acetaminophen 10-325 MG per tablet   Commonly known as: PERCOCET   Take 3 tablets by mouth every 4 (four) hours as needed. For pain      piperacillin-tazobactam 3-0.375 GM/50ML IVPB   Commonly known as: ZOSYN   Inject 50 mLs (3.375 g total) into the vein every 8 (eight) hours.      prochlorperazine 10 MG tablet   Commonly known as: COMPAZINE   Take 10 mg by mouth every 6 (six) hours as needed.      pyridOXINE 50 MG tablet   Commonly known as: VITAMIN B-6   Take 50 mg by mouth daily.      RHINOCORT AQUA 32 MCG/ACT nasal spray   Generic drug: budesonide   Place 1 spray into the nose daily.      SANTYL ointment   Generic drug: collagenase   Apply 1 application topically daily.      sodium chloride 0.9 % SOLN 500 mL with vancomycin 1000 MG SOLR 1,500  mg   Inject 1,500 mg into the vein every 12 (twelve) hours.      sucralfate 1 G tablet   Commonly known as: CARAFATE   Take 1 g by mouth 3 (three) times daily as needed.             Hospital Course: This very pleasant gentleman was admitted on 06/13/11 with bleeding stage 4 sacral decubitus ulcer. It was also found to be superinfected and required debridement and cauterization done by Dr Abbey Chatters on 06/16/11. Wound culture grew multiple species consistent with multiple fecal flora. Patient has had wound care per surgery, including hydrotherapy. He should complete 7 more days of vanc/zosyn. He has elected to go home and have wife and Home health help with IV abx. He will continue PT and wound care. He was started on antihypertensives for poorly controlled BP. He will need close monitoring of renal function while on IV abx, and arb. He is medically stable for d/c.   Day of Discharge BP 166/73  Pulse 57  Temp(Src) 98.3 F (36.8 C) (Oral)  Resp 18  Ht 6\' 7"  (2.007 m)  Wt 141.2 kg (311 lb 4.6 oz)  BMI 35.07 kg/m2  SpO2 93%  Physical Exam: No drainage from  sacral ulcer or right heel ulcer.  No results found for this or any previous visit (from the past 24 hour(s)).  Disposition: home with home health services.   Follow-up Appts: Discharge Orders    Future Orders Please Complete By Expires   Diet - low sodium heart healthy      Increase activity slowly         Follow-up Information    Follow up with Garlan Fillers, MD .         Tests Needing Follow-up: Vancomycin trough, BMP to be drawn by home health nurse.  Time spent in discharge (includes decision making & examination of pt): 40 minutes  Signed: Shakil Dirk 06/21/2011, 10:40 AM

## 2011-06-21 NOTE — Plan of Care (Signed)
Problem: Phase III Progression Outcomes Goal: IV/normal saline lock discontinued Outcome: Not Applicable Date Met:  06/21/11 Pt. D/c with PICC line.  Pt. Going home with Home Health who will be administering IV antibiotics

## 2011-06-21 NOTE — Progress Notes (Signed)
IV team capped off patient's PICC line since he is being d/c with line in place for IV antibiotics with [facility] RN.  Medications were reviewed with patient for discharge without any questions.  Non emergency ambulance was called at 1730 after PICC line was capped off for transport home.  Pt. Ready for d/c.

## 2011-06-21 NOTE — Progress Notes (Signed)
Ok for discharge from our standpoint.  I advised him to continue the wound care that he was doing when the wound was healing well.

## 2011-06-21 NOTE — Progress Notes (Signed)
ANTIBIOTIC CONSULT NOTE - FOLLOW UP  Pharmacy Consult for Vanc/Zosyn Indication: sacral decub  Allergies  Allergen Reactions  . Ciprofloxacin   . Clonidine Derivatives   . Darvocet (Propoxyphene N-Acetaminophen)   . Decadron (Dexamethasone)   . Lipitor (Atorvastatin Calcium)   . Prednisolone     Patient Measurements: Height: 6\' 7"  (200.7 cm) Weight: 311 lb 4.6 oz (141.2 kg) IBW/kg (Calculated) : 93.7   Vital Signs: Temp: 98.3 F (36.8 C) (03/15 0601) Temp src: Oral (03/15 0601) BP: 166/73 mmHg (03/15 0700) Pulse Rate: 57  (03/15 0601) Intake/Output from previous day: 03/14 0701 - 03/15 0700 In: 2536.8 [P.O.:1090; I.V.:396.8; IV Piggyback:1050] Out: 6100 [Urine:6100] Intake/Output from this shift: Total I/O In: -  Out: 1275 [Urine:1275]  Labs:  [facility] 06/20/11 0540 06/19/11 0615  WBC 8.1 7.2  HGB 8.6* 8.2*  PLT 321 319  LABCREA -- --  CREATININE 0.46* 0.49*   Assessment:  60 YOM on days #9 Vanc 1500 mg IV q8h + Zozyn EI for sacral decub s/p debridement 3/11 and 3/14.   MD noted will need 14 days of IV abx. PICC placed and plan home with home health today.    Last vanc trough low so vanc was increased accordingly  Discharge summary plan d/c with Vanc 1500 mg IV q12h and Zosyn EI   Afebrile, WBC wnl, stable renal fxn.    Plan:   If home health able to give q8h dosing (as with Zosyn) please change discharge Vancomycin dosing to 1500 mg IV q8h.  If opting for more convenient dosing schedule, please consider changing Vanc to 2gm IV q12h.    Would recommend to check another Vancomycin trough in a couple of days.    Geoffry Paradise Thi 06/21/2011,11:28 AM

## 2011-06-21 NOTE — Progress Notes (Signed)
Pt with increase b/p of 166 systolic after giving hydralazine. Midlevel paged will pass on to next nurse. Pt w/o any new s/s of distress at this time.

## 2011-06-21 NOTE — Progress Notes (Signed)
Physical Therapy Wound Treatment Patient Details  Name: NATHANEIL FEAGANS MRN: [id number] Date of Birth: 1950/10/23  Today's Date: 06/21/2011 Time: 4098-1191 Time Calculation (min): 15 min  Subjective     Pain Score: Pain Score: 10-Worst pain ever  Wound Assessment  Pressure Ulcer 06/13/11 Unstageable - Full thickness tissue loss in which the base of the ulcer is covered by slough (yellow, tan, gray, green or Shamanda Len) and/or eschar (tan, Mahaila Tischer or black) in the wound bed. large pressure ulcer with foul odor and unstage (Active)  State of Healing Non-healing 06/21/2011  9:10 AM  Site / Wound Assessment Clean;Red 06/21/2011  9:10 AM  % Wound base Red or Granulating 75% 06/21/2011  9:10 AM  % Wound base Yellow 15% 06/21/2011  9:10 AM  % Wound base Black 10% 06/21/2011  9:10 AM  Peri-wound Assessment Intact 06/21/2011  9:10 AM  Wound Length (cm) 24 cm 06/20/2011  8:10 PM  Wound Width (cm) 13 cm 06/20/2011  8:10 PM  Wound Depth (cm) 4 cm 06/20/2011  8:10 PM  Tunneling (cm) 6 06/20/2011  8:10 PM  Margins Unattacted edges (unapproximated) 06/21/2011  9:10 AM  Drainage Amount Moderate 06/21/2011  9:10 AM  Drainage Description Serous 06/21/2011  9:10 AM  Treatment Hydrotherapy (Pulse lavage);Packing (Saline gauze) 06/21/2011  9:10 AM  Dressing Type Moist to dry;Gauze (Comment);ABD;Transparent dressing;Tape dressing 06/21/2011  9:10 AM  Dressing Changed 06/21/2011  9:10 AM     Pressure Ulcer 06/13/11 Unstageable - Full thickness tissue loss in which the base of the ulcer is covered by slough (yellow, tan, gray, green or Merrin Mcvicker) and/or eschar (tan, Carliyah Cotterman or black) in the wound bed. necrotic tissue surrounded by red non-blanchabl (Active)  State of Healing Early/partial granulation 06/21/2011  9:10 AM  Site / Wound Assessment Yellow;Black;Red 06/21/2011  9:10 AM  % Wound base Red or Granulating 35% 06/21/2011  9:10 AM  % Wound base Yellow 35% 06/21/2011  9:10 AM  % Wound base Black 30% 06/21/2011  9:10 AM  Peri-wound  Assessment Erythema (non-blanchable) 06/21/2011  9:10 AM  Wound Length (cm) 6 cm 06/19/2011 12:01 PM  Wound Width (cm) 2 cm 06/19/2011 12:01 PM  Tunneling (cm) 0 06/21/2011  9:10 AM  Undermining (cm) 0 06/21/2011  9:10 AM  Margins Unattacted edges (unapproximated) 06/21/2011  9:10 AM  Drainage Amount Moderate 06/21/2011  9:10 AM  Drainage Description Serous 06/21/2011  9:10 AM  Treatment Cleansed;Hydrotherapy (Pulse lavage) 06/21/2011  9:10 AM  Dressing Type ABD;Moist to dry 06/21/2011  9:10 AM  Dressing Changed 06/21/2011  9:10 AM     Pressure Ulcer 06/14/11 Unstageable - Full thickness tissue loss in which the base of the ulcer is covered by slough (yellow, tan, gray, green or Elani Delph) and/or eschar (tan, Mavric Cortright or black) in the wound bed. granulation tissue, slough and small amt of esc (Active)  State of Healing Fully granulated 06/20/2011  6:30 PM  Site / Wound Assessment Bleeding;Granulation tissue;Red 06/20/2011  6:30 PM  % Wound base Red or Granulating 80% 06/20/2011  6:30 PM  % Wound base Yellow 20% 06/20/2011  6:30 PM  % Wound base Black 25% 06/14/2011  8:00 PM  Peri-wound Assessment Intact 06/20/2011  6:30 PM  Wound Length (cm) 4 cm 06/20/2011  6:30 PM  Wound Width (cm) 3 cm 06/20/2011  6:30 PM  Wound Depth (cm) 0 cm 06/20/2011  6:30 PM  Drainage Amount Minimal 06/20/2011  6:30 PM  Drainage Description Serosanguineous;Odor 06/20/2011  6:30 PM  Dressing Type Moist to dry 06/20/2011  6:30 PM  Dressing Changed 06/20/2011  6:30 PM     Wound 06/14/11 Abrasion(s) Toe (Comment  which one) Left;Other (Comment) granulaion on top of 1,2,3 toes (Active)  Site / Wound Assessment Black;Granulation tissue 06/21/2011  8:15 AM  % Wound base Red or Granulating 100% 06/14/2011  8:00 PM  % Wound base Black 100% 06/21/2011  8:15 AM  Dressing Type Moist to moist 06/14/2011  8:00 PM  Dressing Changed Changed 06/20/2011  6:30 PM  Dressing Status Old drainage 06/20/2011  6:30 PM     Incision 06/17/11 Buttocks Bilateral (Active)    Drainage Amount None 06/17/2011  6:30 PM  Dressing Intact 06/20/2011  7:50 AM   Hydrotherapy Pulsed lavage therapy - wound location: sacral, buttocks Pulsed Lavage with Suction (psi): 8 psi Pulsed Lavage with Suction - Normal Saline Used: 1000 mL Pulsed Lavage Tip: Tip with splash shield   Wound Assessment and Plan  Pt elects to go home with HHPT and wife to do dressing changes.  He says his wife will contact wound care center for follow up and  Dressing options.    Wound Therapy Goals- Improve the function of patient's integumentary system by progressing the wound(s) through the phases of wound healing (inflammation - proliferation - remodeling) by:    Goals will be updated until maximal potential achieved or discharge criteria met.  Discharge criteria: when goals achieved, discharge from hospital, MD decision/surgical intervention, no progress towards goals, refusal/missing three consecutive treatments without notification or medical reason.  Ebony Hail [facility] 06/21/2011, 11:39 AM

## 2011-06-21 NOTE — Plan of Care (Signed)
Problem: Phase I Progression Outcomes Goal: Voiding-avoid urinary catheter unless indicated Outcome: Not Applicable Date Met:  06/21/11 Pt. Has chronic foley catheter

## 2011-06-21 NOTE — Progress Notes (Signed)
Spoke with both pt and wife at the bedside concerning Home Health Care.  Pt is active with [facility] and will continue with they for [facility], [facility], HH PT. IV Vancomycin and Zosyn from [facility]. Frances Furbish will start care in am 06/22/11, which is ok with pt and wife.

## 2011-06-21 NOTE — Progress Notes (Signed)
Physical Therapy Treatment Patient Details Name: [name] MRN: [id number] DOB: [birth year] Today's Date: 06/21/2011  PT Assessment/Plan  PT - Assessment/Plan Comments on Treatment Session: pt allowed PROM of LE, especially at right ankle. Unable to achieve full knee extension.. Limited in abduction and external rotation as well. PT Plan: Discharge plan remains appropriate;Frequency remains appropriate PT Frequency: Min 3X/week PT Goals  Acute Rehab PT Goals PT Transfer Goal: Bed to Chair/Chair to Bed - Progress: Other (comment) (pt deferred) PT Goal: Perform Home Exercise Program - Progress: Progressing toward goal  PT Treatment Precautions/Restrictions  Precautions Precautions: Other (comment) (sacral, buttock, L heel wound) Required Braces or Orthoses: No Restrictions Weight Bearing Restrictions: No Other Position/Activity Restrictions: pt on kin-air bed Mobility (including Balance) Bed Mobility Bed Mobility: Yes Rolling Right: 3: Mod assist Rolling Right Details (indicate cue type and reason): pt able to give detailed verbal directions for placing legs, positioning bed and rails Rolling Left: 3: Mod assist Rolling Left Details (indicate cue type and reason): pt gives detailed verbal directions Transfers Transfers: No (pt refused to sit on EOB due to pain) Ambulation/Gait Ambulation/Gait: No Stairs: No Wheelchair Mobility Wheelchair Mobility: No  Posture/Postural Control Posture/Postural Control: Postural limitations Postural Limitations: abdominal weakness and pain from previous surgeries, paraparesis with increased tone limiting positions Balance Balance Assessed: No Exercise  General Exercises - Lower Extremity Ankle Circles/Pumps: PROM;Right;15 reps;Supine Heel Slides: PROM;Both;5 reps;Supine;Limitations (limited by pain and increased tone) Heel Slides Limitations: pt limited by pain and increased tone Hip ABduction/ADduction: PROM;Both;5  reps;Supine;Limitations Hip Abduction/Adduction Limitations: limited by pain and increased tone pulling into int rotation Other Exercises Other Exercises: UE exercises with blue therband for shoulder horizontal abduction, external rotation and elbow extension. End of Session PT - End of Session Activity Tolerance: Patient limited by pain Patient left: in bed General Behavior During Session: [facility] for tasks performed Cognition: [facility] for tasks performed  Donnetta Hail 06/21/2011, 11:29 AM

## 2011-06-27 ENCOUNTER — Encounter (HOSPITAL_BASED_OUTPATIENT_CLINIC_OR_DEPARTMENT_OTHER): Payer: 59 | Attending: Internal Medicine

## 2011-06-27 DIAGNOSIS — L89109 Pressure ulcer of unspecified part of back, unspecified stage: Secondary | ICD-10-CM | POA: Insufficient documentation

## 2011-06-27 DIAGNOSIS — Z79899 Other long term (current) drug therapy: Secondary | ICD-10-CM | POA: Insufficient documentation

## 2011-06-27 DIAGNOSIS — I10 Essential (primary) hypertension: Secondary | ICD-10-CM | POA: Insufficient documentation

## 2011-06-27 DIAGNOSIS — L8994 Pressure ulcer of unspecified site, stage 4: Secondary | ICD-10-CM | POA: Insufficient documentation

## 2011-06-27 DIAGNOSIS — Z8673 Personal history of transient ischemic attack (TIA), and cerebral infarction without residual deficits: Secondary | ICD-10-CM | POA: Insufficient documentation

## 2011-07-03 ENCOUNTER — Encounter (HOSPITAL_COMMUNITY): Payer: Self-pay | Admitting: General Surgery

## 2011-07-04 NOTE — Progress Notes (Signed)
[facility]  NAME:  ROMEN, YUTZY                  ACCOUNT NO.:  [id number]  MEDICAL RECORD NO.:  [id number]      DATE OF BIRTH:  Aug 08, 1950  PHYSICIAN:  Maxwell Caul, M.D.      VISIT DATE:                                  OFFICE VISIT   Mr. Burridge is a gentleman that we follow in the Wound Care Center for several areas including the left heel, left great, and second toes.  We had been following a wound on his coccyx as well, although we had not been able to see this in quite some time as the patient had been unable to stand in order for [place] to see this and our stretcher simply were not amenable to the type of transfer that would take to, be able to see this properly here.  This was being followed by [facility].  The last we had seen this was a picture the patient brought in December at which time the wound actually looked fairly stable.  He ultimately deteriorated at home, although the course of this is not been clear to me.  We were e-mailed pictures of this buttock wound, sometime towards the beginning of March.  This showed a large stage IV wound with necrotic tissue inferiorly over the right ischial tuberosity. We recommended admission and surgical consultation.  This was done by Dr. Abbey Chatters.  I believe he was taken to the OR on June 16, 2011. Wound culture grew multiple species.  He was treated with IV vanc and Zosyn, and then discharged.  He continued with vanc and Zosyn at home. His wife has been doing the dressings with silver alginate.  He came in today by EMS transport for [place] to review this.  On examination, is an absolutely huge, stage IV wound extending from his coccyx to both sides over his ischial tuberosities and extending very close to his anal opening.  He also has a satellite lesion over the left ischial tuberosity, very close to the large original wound.  None of this actually looks to be infected currently, I saw nothing that  was worth culturing. The granulation tissue appears to be healthy. There was nothing that really looked like it needed debridement.  The dimensions of this wound were measure in the clinic as 18 x 11.5 x 6.  Over the left heel, the wound measured 3.9 x 3 x 0.1, left second and great toe have smaller wounds.  IMPRESSION:  Multiple wounds as described above, almost all of these are pressure.  The wound ordres were kept simple, we have ordered silver alginate to all of the wounds.  The large sacral coccyx wound will be covered with a large ABD pad.  I do not think the wound over the sacrum can be wound VAC. The area simply approximates his anus by 3/4 of an inch.  We did spend some time talking about referring him to Plastic Surgery for consideration of a myocutaneous flap.  However, the patient states he simply cannot maintain weight off his coccyx sacral area due to pain in his back "a large kidney mass on the left."  We will call for whatever offloading modalities would be helpful here including a hospital bed, ? KinAir bed.  We will have somebody look at what he has in his own home and right whatever orders it would take to try and help him.  I have emphasized offloading this, I do not think he should be on this area unless this is absolutely necessary.  I did not think anything here looked infected. He does not have easy transportation to doctors, I removed this PICC line without incident. I will make arrangements to see this in a month.  We will write orders for home health as above.  We will have a representative from a DME supplier look at current DME and what else can be done to try and offload this area.          ______________________________ Maxwell Caul, M.D.     MGR/MEDQ  D:  07/04/2011  T:  07/04/2011  Job:  [id number]

## 2011-07-18 ENCOUNTER — Other Ambulatory Visit (HOSPITAL_COMMUNITY): Payer: Self-pay | Admitting: Internal Medicine

## 2011-07-25 ENCOUNTER — Encounter (HOSPITAL_BASED_OUTPATIENT_CLINIC_OR_DEPARTMENT_OTHER): Payer: 59

## 2011-08-01 ENCOUNTER — Encounter (HOSPITAL_BASED_OUTPATIENT_CLINIC_OR_DEPARTMENT_OTHER): Payer: 59 | Attending: Internal Medicine

## 2011-08-01 DIAGNOSIS — L89109 Pressure ulcer of unspecified part of back, unspecified stage: Secondary | ICD-10-CM | POA: Insufficient documentation

## 2011-08-01 DIAGNOSIS — L89609 Pressure ulcer of unspecified heel, unspecified stage: Secondary | ICD-10-CM | POA: Insufficient documentation

## 2011-08-01 DIAGNOSIS — L8994 Pressure ulcer of unspecified site, stage 4: Secondary | ICD-10-CM | POA: Insufficient documentation

## 2011-08-29 ENCOUNTER — Encounter (HOSPITAL_BASED_OUTPATIENT_CLINIC_OR_DEPARTMENT_OTHER): Payer: 59 | Attending: Internal Medicine

## 2011-09-12 ENCOUNTER — Encounter (HOSPITAL_BASED_OUTPATIENT_CLINIC_OR_DEPARTMENT_OTHER): Payer: 59 | Attending: Internal Medicine

## 2011-09-12 DIAGNOSIS — I10 Essential (primary) hypertension: Secondary | ICD-10-CM | POA: Insufficient documentation

## 2011-09-12 DIAGNOSIS — E46 Unspecified protein-calorie malnutrition: Secondary | ICD-10-CM | POA: Insufficient documentation

## 2011-09-12 DIAGNOSIS — D649 Anemia, unspecified: Secondary | ICD-10-CM | POA: Insufficient documentation

## 2011-09-12 DIAGNOSIS — L89309 Pressure ulcer of unspecified buttock, unspecified stage: Secondary | ICD-10-CM | POA: Insufficient documentation

## 2011-09-12 DIAGNOSIS — G8220 Paraplegia, unspecified: Secondary | ICD-10-CM | POA: Insufficient documentation

## 2011-09-12 DIAGNOSIS — IMO0002 Reserved for concepts with insufficient information to code with codable children: Secondary | ICD-10-CM | POA: Insufficient documentation

## 2011-09-12 DIAGNOSIS — L8994 Pressure ulcer of unspecified site, stage 4: Secondary | ICD-10-CM | POA: Insufficient documentation

## 2011-10-03 ENCOUNTER — Encounter (HOSPITAL_BASED_OUTPATIENT_CLINIC_OR_DEPARTMENT_OTHER): Payer: 59

## 2011-10-25 ENCOUNTER — Ambulatory Visit (HOSPITAL_COMMUNITY)
Admission: RE | Admit: 2011-10-25 | Discharge: 2011-10-25 | Disposition: A | Discharge status: Home / Self Care | Payer: 59 | Source: Ambulatory Visit | Attending: Family Medicine | Admitting: Family Medicine

## 2011-10-25 DIAGNOSIS — M7989 Other specified soft tissue disorders: Secondary | ICD-10-CM

## 2011-10-25 DIAGNOSIS — R7989 Other specified abnormal findings of blood chemistry: Secondary | ICD-10-CM

## 2011-10-25 DIAGNOSIS — G8220 Paraplegia, unspecified: Secondary | ICD-10-CM | POA: Insufficient documentation

## 2011-10-25 NOTE — Progress Notes (Signed)
VASCULAR LAB PRELIMINARY  PRELIMINARY  PRELIMINARY  PRELIMINARY  Bilateral lower extremity venous duplex  completed.    Preliminary report:  Bilateral:  No obvious evidence of DVT, superficial thrombosis, or Baker's Cyst.   Alphonsa Brickle, RVT 10/25/2011, 11:52 AM

## 2011-11-28 ENCOUNTER — Encounter (HOSPITAL_BASED_OUTPATIENT_CLINIC_OR_DEPARTMENT_OTHER): Payer: 59 | Attending: Internal Medicine

## 2011-11-28 DIAGNOSIS — L89109 Pressure ulcer of unspecified part of back, unspecified stage: Secondary | ICD-10-CM | POA: Insufficient documentation

## 2011-11-28 DIAGNOSIS — L8993 Pressure ulcer of unspecified site, stage 3: Secondary | ICD-10-CM | POA: Insufficient documentation

## 2012-01-02 ENCOUNTER — Encounter (HOSPITAL_BASED_OUTPATIENT_CLINIC_OR_DEPARTMENT_OTHER): Payer: 59 | Attending: Internal Medicine

## 2012-02-06 ENCOUNTER — Encounter (HOSPITAL_BASED_OUTPATIENT_CLINIC_OR_DEPARTMENT_OTHER): Payer: 59 | Attending: Internal Medicine

## 2012-02-06 DIAGNOSIS — L8993 Pressure ulcer of unspecified site, stage 3: Secondary | ICD-10-CM | POA: Insufficient documentation

## 2012-02-06 DIAGNOSIS — L89109 Pressure ulcer of unspecified part of back, unspecified stage: Secondary | ICD-10-CM | POA: Insufficient documentation

## 2012-02-06 DIAGNOSIS — L89609 Pressure ulcer of unspecified heel, unspecified stage: Secondary | ICD-10-CM | POA: Insufficient documentation

## 2012-02-06 DIAGNOSIS — L8992 Pressure ulcer of unspecified site, stage 2: Secondary | ICD-10-CM | POA: Insufficient documentation

## 2012-06-11 ENCOUNTER — Encounter (HOSPITAL_BASED_OUTPATIENT_CLINIC_OR_DEPARTMENT_OTHER): Payer: 59 | Attending: Internal Medicine

## 2012-06-11 DIAGNOSIS — L89109 Pressure ulcer of unspecified part of back, unspecified stage: Secondary | ICD-10-CM | POA: Insufficient documentation

## 2012-06-11 DIAGNOSIS — L8993 Pressure ulcer of unspecified site, stage 3: Secondary | ICD-10-CM | POA: Insufficient documentation

## 2012-07-20 ENCOUNTER — Other Ambulatory Visit (HOSPITAL_COMMUNITY): Payer: 59

## 2012-07-20 ENCOUNTER — Other Ambulatory Visit (HOSPITAL_COMMUNITY): Payer: Self-pay | Admitting: Family Medicine

## 2012-07-20 DIAGNOSIS — I2699 Other pulmonary embolism without acute cor pulmonale: Secondary | ICD-10-CM

## 2012-07-21 ENCOUNTER — Encounter (HOSPITAL_COMMUNITY): Payer: Self-pay

## 2012-07-21 ENCOUNTER — Ambulatory Visit (HOSPITAL_COMMUNITY)
Admission: RE | Admit: 2012-07-21 | Discharge: 2012-07-21 | Disposition: A | Discharge status: Home / Self Care | Payer: 59 | Source: Ambulatory Visit | Attending: Family Medicine | Admitting: Family Medicine

## 2012-07-21 DIAGNOSIS — I2699 Other pulmonary embolism without acute cor pulmonale: Secondary | ICD-10-CM

## 2012-07-21 DIAGNOSIS — R6889 Other general symptoms and signs: Secondary | ICD-10-CM | POA: Insufficient documentation

## 2012-07-21 DIAGNOSIS — I700 Atherosclerosis of aorta: Secondary | ICD-10-CM | POA: Insufficient documentation

## 2012-07-21 DIAGNOSIS — R0602 Shortness of breath: Secondary | ICD-10-CM | POA: Insufficient documentation

## 2012-07-21 DIAGNOSIS — R079 Chest pain, unspecified: Secondary | ICD-10-CM | POA: Insufficient documentation

## 2012-07-21 MED ORDER — IOHEXOL 350 MG/ML SOLN
100.0000 mL | Freq: Once | INTRAVENOUS | Status: AC | PRN
  Administered 2012-07-21: 100 mL via INTRAVENOUS

## 2012-09-17 ENCOUNTER — Encounter (HOSPITAL_BASED_OUTPATIENT_CLINIC_OR_DEPARTMENT_OTHER): Payer: 59 | Attending: Internal Medicine

## 2012-09-25 ENCOUNTER — Other Ambulatory Visit (HOSPITAL_COMMUNITY): Payer: Self-pay | Admitting: Urology

## 2012-09-25 DIAGNOSIS — N281 Cyst of kidney, acquired: Secondary | ICD-10-CM

## 2012-09-25 DIAGNOSIS — N319 Neuromuscular dysfunction of bladder, unspecified: Secondary | ICD-10-CM

## 2012-10-13 ENCOUNTER — Ambulatory Visit (HOSPITAL_COMMUNITY)
Admission: RE | Admit: 2012-10-13 | Discharge: 2012-10-13 | Disposition: A | Discharge status: Home / Self Care | Payer: 59 | Source: Ambulatory Visit | Attending: Urology | Admitting: Urology

## 2012-10-13 DIAGNOSIS — N319 Neuromuscular dysfunction of bladder, unspecified: Secondary | ICD-10-CM | POA: Insufficient documentation

## 2012-10-13 DIAGNOSIS — I2510 Atherosclerotic heart disease of native coronary artery without angina pectoris: Secondary | ICD-10-CM | POA: Insufficient documentation

## 2012-10-13 DIAGNOSIS — I10 Essential (primary) hypertension: Secondary | ICD-10-CM | POA: Insufficient documentation

## 2012-10-13 DIAGNOSIS — N281 Cyst of kidney, acquired: Secondary | ICD-10-CM | POA: Insufficient documentation

## 2013-11-26 ENCOUNTER — Telehealth: Payer: Self-pay | Admitting: Cardiology

## 2013-11-26 NOTE — Telephone Encounter (Signed)
NEW PROBLEM:     PCP per Dr Shirlean Mylararol Webb  [phone] Bobbi )  this pt needs to be see ASAP.   Dx intermittent tach and SOB. Please call pt and Bobbi when appt made.

## 2013-11-26 NOTE — Telephone Encounter (Signed)
Pt has not seen Dr Mayford Knifeurner Since prior to 2009. He is considered a new pt. Also there is no availability until November for pt on our schedule, and it sounds like he needs to be seen much sooner. Please advise pt he is considered a new pt and he can see any Dr in our office that has availability.

## 2013-12-21 ENCOUNTER — Ambulatory Visit: Payer: 59 | Admitting: Cardiology

## 2014-02-09 ENCOUNTER — Ambulatory Visit: Payer: 59 | Admitting: Cardiology

## 2014-02-19 ENCOUNTER — Emergency Department (HOSPITAL_COMMUNITY): Payer: 59

## 2014-02-19 ENCOUNTER — Encounter (HOSPITAL_COMMUNITY): Payer: Self-pay

## 2014-02-19 ENCOUNTER — Inpatient Hospital Stay (HOSPITAL_COMMUNITY)
Admission: EM | Admit: 2014-02-19 | Discharge: 2014-02-24 | DRG: 438 | Disposition: A | Discharge status: Home Health | Payer: 59 | Attending: Internal Medicine | Admitting: Internal Medicine

## 2014-02-19 DIAGNOSIS — R112 Nausea with vomiting, unspecified: Secondary | ICD-10-CM | POA: Diagnosis present

## 2014-02-19 DIAGNOSIS — I10 Essential (primary) hypertension: Secondary | ICD-10-CM | POA: Diagnosis present

## 2014-02-19 DIAGNOSIS — Z7982 Long term (current) use of aspirin: Secondary | ICD-10-CM

## 2014-02-19 DIAGNOSIS — Z79899 Other long term (current) drug therapy: Secondary | ICD-10-CM

## 2014-02-19 DIAGNOSIS — J189 Pneumonia, unspecified organism: Secondary | ICD-10-CM | POA: Diagnosis present

## 2014-02-19 DIAGNOSIS — N390 Urinary tract infection, site not specified: Secondary | ICD-10-CM | POA: Diagnosis present

## 2014-02-19 DIAGNOSIS — A4902 Methicillin resistant Staphylococcus aureus infection, unspecified site: Secondary | ICD-10-CM | POA: Diagnosis present

## 2014-02-19 DIAGNOSIS — G8220 Paraplegia, unspecified: Secondary | ICD-10-CM

## 2014-02-19 DIAGNOSIS — R05 Cough: Secondary | ICD-10-CM

## 2014-02-19 DIAGNOSIS — Z6841 Body Mass Index (BMI) 40.0 and over, adult: Secondary | ICD-10-CM | POA: Diagnosis not present

## 2014-02-19 DIAGNOSIS — F29 Unspecified psychosis not due to a substance or known physiological condition: Secondary | ICD-10-CM | POA: Diagnosis present

## 2014-02-19 DIAGNOSIS — J449 Chronic obstructive pulmonary disease, unspecified: Secondary | ICD-10-CM | POA: Diagnosis present

## 2014-02-19 DIAGNOSIS — E876 Hypokalemia: Secondary | ICD-10-CM | POA: Diagnosis present

## 2014-02-19 DIAGNOSIS — R0902 Hypoxemia: Secondary | ICD-10-CM | POA: Diagnosis present

## 2014-02-19 DIAGNOSIS — B965 Pseudomonas (aeruginosa) (mallei) (pseudomallei) as the cause of diseases classified elsewhere: Secondary | ICD-10-CM | POA: Diagnosis present

## 2014-02-19 DIAGNOSIS — R059 Cough, unspecified: Secondary | ICD-10-CM

## 2014-02-19 DIAGNOSIS — I2510 Atherosclerotic heart disease of native coronary artery without angina pectoris: Secondary | ICD-10-CM | POA: Diagnosis present

## 2014-02-19 DIAGNOSIS — D649 Anemia, unspecified: Secondary | ICD-10-CM | POA: Diagnosis present

## 2014-02-19 DIAGNOSIS — D72829 Elevated white blood cell count, unspecified: Secondary | ICD-10-CM | POA: Diagnosis present

## 2014-02-19 DIAGNOSIS — Z7401 Bed confinement status: Secondary | ICD-10-CM

## 2014-02-19 DIAGNOSIS — R945 Abnormal results of liver function studies: Secondary | ICD-10-CM

## 2014-02-19 DIAGNOSIS — K859 Acute pancreatitis without necrosis or infection, unspecified: Secondary | ICD-10-CM | POA: Diagnosis present

## 2014-02-19 DIAGNOSIS — Z889 Allergy status to unspecified drugs, medicaments and biological substances status: Secondary | ICD-10-CM

## 2014-02-19 DIAGNOSIS — R7989 Other specified abnormal findings of blood chemistry: Secondary | ICD-10-CM | POA: Diagnosis present

## 2014-02-19 DIAGNOSIS — G8929 Other chronic pain: Secondary | ICD-10-CM | POA: Diagnosis present

## 2014-02-19 DIAGNOSIS — R1110 Vomiting, unspecified: Secondary | ICD-10-CM

## 2014-02-19 DIAGNOSIS — T83511A Infection and inflammatory reaction due to indwelling urethral catheter, initial encounter: Secondary | ICD-10-CM

## 2014-02-19 DIAGNOSIS — R109 Unspecified abdominal pain: Secondary | ICD-10-CM

## 2014-02-19 DIAGNOSIS — Z85828 Personal history of other malignant neoplasm of skin: Secondary | ICD-10-CM | POA: Diagnosis not present

## 2014-02-19 LAB — I-STAT CG4 LACTIC ACID, ED: Lactic Acid, Venous: 1.18 mmol/L (ref 0.5–2.2)

## 2014-02-19 LAB — COMPREHENSIVE METABOLIC PANEL
ALBUMIN: 3.3 g/dL — AB (ref 3.5–5.2)
ALT: 244 U/L — ABNORMAL HIGH (ref 0–53)
ANION GAP: 11 (ref 5–15)
AST: 78 U/L — ABNORMAL HIGH (ref 0–37)
Alkaline Phosphatase: 190 U/L — ABNORMAL HIGH (ref 39–117)
BUN: 9 mg/dL (ref 6–23)
CALCIUM: 9.7 mg/dL (ref 8.4–10.5)
CO2: 36 mEq/L — ABNORMAL HIGH (ref 19–32)
Chloride: 90 mEq/L — ABNORMAL LOW (ref 96–112)
Creatinine, Ser: 0.49 mg/dL — ABNORMAL LOW (ref 0.50–1.35)
GFR calc non Af Amer: >90 mL/min (ref >90)
GLUCOSE: 113 mg/dL — AB (ref 70–99)
POTASSIUM: 3.6 meq/L — AB (ref 3.7–5.3)
Sodium: 137 mEq/L (ref 137–147)
TOTAL PROTEIN: 7.6 g/dL (ref 6.0–8.3)
Total Bilirubin: 0.8 mg/dL (ref 0.3–1.2)

## 2014-02-19 LAB — CBC WITH DIFFERENTIAL/PLATELET
BASOS PCT: 0 % (ref 0–1)
Basophils Absolute: 0 10*3/uL (ref 0.0–0.1)
EOS ABS: 0 10*3/uL (ref 0.0–0.7)
EOS PCT: 0 % (ref 0–5)
HCT: 40.1 % (ref 39.0–52.0)
Hemoglobin: 12.9 g/dL — ABNORMAL LOW (ref 13.0–17.0)
LYMPHS ABS: 1.6 10*3/uL (ref 0.7–4.0)
Lymphocytes Relative: 9 % — ABNORMAL LOW (ref 12–46)
MCH: 29.3 pg (ref 26.0–34.0)
MCHC: 32.2 g/dL (ref 30.0–36.0)
MCV: 90.9 fL (ref 78.0–100.0)
Monocytes Absolute: 1.2 10*3/uL — ABNORMAL HIGH (ref 0.1–1.0)
Monocytes Relative: 7 % (ref 3–12)
NEUTROS PCT: 84 % — AB (ref 43–77)
Neutro Abs: 15.1 10*3/uL — ABNORMAL HIGH (ref 1.7–7.7)
Platelets: 164 10*3/uL (ref 150–400)
RBC: 4.41 MIL/uL (ref 4.22–5.81)
RDW: 14.4 % (ref 11.5–15.5)
WBC: 17.9 10*3/uL — ABNORMAL HIGH (ref 4.0–10.5)

## 2014-02-19 LAB — TROPONIN I

## 2014-02-19 LAB — PRO B NATRIURETIC PEPTIDE: PRO B NATRI PEPTIDE: 333.9 pg/mL — AB (ref 0–125)

## 2014-02-19 LAB — URINALYSIS, ROUTINE W REFLEX MICROSCOPIC
Bilirubin Urine: NEGATIVE
Glucose, UA: NEGATIVE mg/dL
KETONES UR: NEGATIVE mg/dL
NITRITE: NEGATIVE
PROTEIN: 100 mg/dL — AB
Specific Gravity, Urine: 1.025 (ref 1.005–1.030)
Urobilinogen, UA: 1 mg/dL (ref 0.0–1.0)
pH: 6 (ref 5.0–8.0)

## 2014-02-19 LAB — LACTATE DEHYDROGENASE: LDH: 251 U/L — AB (ref 94–250)

## 2014-02-19 LAB — URINE MICROSCOPIC-ADD ON

## 2014-02-19 LAB — LIPASE, BLOOD: Lipase: 54 U/L (ref 11–59)

## 2014-02-19 MED ORDER — IOHEXOL 300 MG/ML  SOLN
100.0000 mL | Freq: Once | INTRAMUSCULAR | Status: AC | PRN
  Administered 2014-02-19: 100 mL via INTRAVENOUS

## 2014-02-19 MED ORDER — ONDANSETRON HCL 4 MG/2ML IJ SOLN
4.0000 mg | Freq: Four times a day (QID) | INTRAMUSCULAR | Status: DC | PRN
  Administered 2014-02-19 – 2014-02-24 (×9): 4 mg via INTRAVENOUS
  Filled 2014-02-19 (×9): qty 2

## 2014-02-19 MED ORDER — PROCHLORPERAZINE EDISYLATE 5 MG/ML IJ SOLN
10.0000 mg | Freq: Once | INTRAMUSCULAR | Status: AC
  Administered 2014-02-19: 10 mg via INTRAVENOUS
  Filled 2014-02-19: qty 2

## 2014-02-19 MED ORDER — BENAZEPRIL HCL 40 MG PO TABS
40.0000 mg | ORAL_TABLET | Freq: Every day | ORAL | Status: DC
  Administered 2014-02-20: 40 mg via ORAL
  Filled 2014-02-19: qty 1

## 2014-02-19 MED ORDER — CARVEDILOL 6.25 MG PO TABS
6.2500 mg | ORAL_TABLET | Freq: Every morning | ORAL | Status: DC
  Administered 2014-02-19 – 2014-02-24 (×6): 6.25 mg via ORAL
  Filled 2014-02-19 (×7): qty 1

## 2014-02-19 MED ORDER — BUDESONIDE-FORMOTEROL FUMARATE 160-4.5 MCG/ACT IN AERO
2.0000 | INHALATION_SPRAY | Freq: Two times a day (BID) | RESPIRATORY_TRACT | Status: DC
  Administered 2014-02-19 – 2014-02-24 (×10): 2 via RESPIRATORY_TRACT
  Filled 2014-02-19: qty 6

## 2014-02-19 MED ORDER — SULFAMETHOXAZOLE-TRIMETHOPRIM 800-160 MG PO TABS
1.0000 | ORAL_TABLET | Freq: Two times a day (BID) | ORAL | Status: DC
Start: 2014-02-19 — End: 2014-02-21
  Administered 2014-02-19 – 2014-02-21 (×4): 1 via ORAL
  Filled 2014-02-19 (×5): qty 1

## 2014-02-19 MED ORDER — ENOXAPARIN SODIUM 40 MG/0.4ML ~~LOC~~ SOLN
40.0000 mg | SUBCUTANEOUS | Status: DC
  Administered 2014-02-19: 40 mg via SUBCUTANEOUS
  Filled 2014-02-19 (×2): qty 0.4

## 2014-02-19 MED ORDER — DIAZEPAM 5 MG PO TABS
10.0000 mg | ORAL_TABLET | Freq: Every evening | ORAL | Status: DC | PRN
  Administered 2014-02-20 – 2014-02-23 (×3): 10 mg via ORAL
  Filled 2014-02-19 (×3): qty 2

## 2014-02-19 MED ORDER — NITROGLYCERIN 0.4 MG SL SUBL: 0.4000 mg | SUBLINGUAL_TABLET | SUBLINGUAL | Status: DC | PRN

## 2014-02-19 MED ORDER — ASPIRIN 325 MG PO TABS
325.0000 mg | ORAL_TABLET | Freq: Every day | ORAL | Status: DC
  Administered 2014-02-20 – 2014-02-24 (×5): 325 mg via ORAL
  Filled 2014-02-19 (×5): qty 1

## 2014-02-19 MED ORDER — ONDANSETRON HCL 4 MG/2ML IJ SOLN
4.0000 mg | Freq: Once | INTRAMUSCULAR | Status: AC
  Administered 2014-02-19: 4 mg via INTRAVENOUS
  Filled 2014-02-19: qty 2

## 2014-02-19 MED ORDER — CARISOPRODOL 350 MG PO TABS
1050.0000 mg | ORAL_TABLET | Freq: Four times a day (QID) | ORAL | Status: DC
  Administered 2014-02-19 – 2014-02-21 (×7): 1050 mg via ORAL
  Filled 2014-02-19 (×7): qty 3

## 2014-02-19 MED ORDER — ALBUTEROL SULFATE (2.5 MG/3ML) 0.083% IN NEBU: 2.5000 mg | INHALATION_SOLUTION | RESPIRATORY_TRACT | Status: DC | PRN

## 2014-02-19 MED ORDER — IPRATROPIUM-ALBUTEROL 0.5-2.5 (3) MG/3ML IN SOLN
3.0000 mL | Freq: Four times a day (QID) | RESPIRATORY_TRACT | Status: DC
  Administered 2014-02-20 – 2014-02-24 (×16): 3 mL via RESPIRATORY_TRACT
  Filled 2014-02-19 (×17): qty 3

## 2014-02-19 MED ORDER — FLUTICASONE PROPIONATE 50 MCG/ACT NA SUSP
1.0000 | Freq: Every day | NASAL | Status: DC
  Administered 2014-02-20 – 2014-02-24 (×5): 1 via NASAL
  Filled 2014-02-19: qty 16

## 2014-02-19 MED ORDER — IPRATROPIUM-ALBUTEROL 0.5-2.5 (3) MG/3ML IN SOLN
3.0000 mL | RESPIRATORY_TRACT | Status: DC
  Administered 2014-02-19: 3 mL via RESPIRATORY_TRACT
  Filled 2014-02-19: qty 3

## 2014-02-19 MED ORDER — HYDROMORPHONE HCL 1 MG/ML IJ SOLN
1.0000 mg | INTRAMUSCULAR | Status: DC | PRN
  Administered 2014-02-19 – 2014-02-20 (×5): 1 mg via INTRAVENOUS
  Filled 2014-02-19 (×6): qty 1

## 2014-02-19 MED ORDER — OXYCODONE HCL 5 MG PO TABS
10.0000 mg | ORAL_TABLET | ORAL | Status: DC | PRN
  Administered 2014-02-19 – 2014-02-20 (×4): 10 mg via ORAL
  Filled 2014-02-19 (×5): qty 2

## 2014-02-19 MED ORDER — POTASSIUM CHLORIDE CRYS ER 20 MEQ PO TBCR
20.0000 meq | EXTENDED_RELEASE_TABLET | Freq: Every day | ORAL | Status: DC
  Administered 2014-02-19 – 2014-02-24 (×6): 20 meq via ORAL
  Filled 2014-02-19 (×6): qty 1

## 2014-02-19 MED ORDER — ONDANSETRON HCL 4 MG PO TABS
4.0000 mg | ORAL_TABLET | Freq: Four times a day (QID) | ORAL | Status: DC | PRN
  Administered 2014-02-23 (×2): 4 mg via ORAL
  Filled 2014-02-19 (×2): qty 1

## 2014-02-19 MED ORDER — LORATADINE 10 MG PO TABS
10.0000 mg | ORAL_TABLET | Freq: Every day | ORAL | Status: DC
  Administered 2014-02-20 – 2014-02-24 (×5): 10 mg via ORAL
  Filled 2014-02-19 (×5): qty 1

## 2014-02-19 MED ORDER — POTASSIUM CHLORIDE CRYS ER 20 MEQ PO TBCR
40.0000 meq | EXTENDED_RELEASE_TABLET | Freq: Once | ORAL | Status: DC
  Filled 2014-02-19: qty 2

## 2014-02-19 MED ORDER — HYDROMORPHONE HCL 1 MG/ML IJ SOLN
0.5000 mg | Freq: Once | INTRAMUSCULAR | Status: AC
  Administered 2014-02-19: 0.5 mg via INTRAVENOUS
  Filled 2014-02-19: qty 1

## 2014-02-19 MED ORDER — IOHEXOL 300 MG/ML  SOLN
50.0000 mL | Freq: Once | INTRAMUSCULAR | Status: AC | PRN
  Administered 2014-02-19: 50 mL via ORAL

## 2014-02-19 NOTE — ED Notes (Signed)
Spoke with nurse on 4th floor, they are getting room ready (pt needs air mattress and trapeze overhead bed), and they will call back when the room is ready. Pt and family updated.

## 2014-02-19 NOTE — ED Notes (Signed)
E.D nurse notified of ready bed and floor nurse notified of 20 min before pt coming to floor...klj

## 2014-02-19 NOTE — H&P (Signed)
[facility] History and Physical  Harold AxeMark C Gundy JYN:[id number] DOB: 10-05-1950 DOA: 02/19/2014  Referring physician: EDP PCP: Shirlean MylarWEBB, CAROL, D, MD   Chief Complaint: nausea, vomiting and abdominal pain since Wednesday.   HPI: [name] is a 63 y.o. 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 He was found to have leukocytosis and CT abdomen reveals non complicated pancreatitis. He was referred to medical service for admission. His other labs revealed low potassium and elevated liver function tests. His lactic acid level was normal. His UA was abnormal and urine cultures sent for evaluation. He was also found to be hypoxic with sats in 88% on arrival. His imaging of the chest did not reveal any pneumonia or heart failure. He was put on 2 lit [place] oxygen and admitted to telemetry.    Review of Systems:  Constitutional:  No weight loss, night sweats, Fevers, chills, fatigue.  HEENT:  No headaches, Difficulty swallowing,Tooth/dental problems,Sore throat,  No sneezing, itching, ear ache, nasal congestion, post nasal drip,  Cardio-vascular:  No chest pain, Orthopnea, PND, swelling in lower extremities, anasarca, dizziness, palpitations  GI:  Nausea, vomiting and abdominal pain.  Resp:  Sob on arrival. And hypoxic.  Skin:  no rash or lesions.  GU:  Has chronic indwelling foley catheter.  Musculoskeletal:  Back pain present.  Psych:  No change in mood or affect. No depression or anxiety. No memory loss.   Past Medical History  Diagnosis Date  . Hypertension   . Coronary artery disease    Past Surgical History  Procedure Laterality Date  . Back surgery    . Irrigation and debridement abscess  06/17/2011    Procedure: IRRIGATION AND DEBRIDEMENT ABSCESS;  Surgeon: Adolph Pollackodd J Rosenbower, MD;   Location: [facility];  Service: General;  Laterality: N/A;   debridement sacral decubitus  . Hernia repair    . Appendectomy    . Cholecystectomy    . Varicose vein surgery     Social History:  reports that he has never smoked. He does not have any smokeless tobacco history on file. He reports that he does not drink alcohol or use illicit drugs.  Allergies  Allergen Reactions  . Ciprofloxacin     Unknown-Noted on list  . Clonidine Derivatives     Unknown-Noted on list  . Darvocet [Propoxyphene N-Acetaminophen]     Unknown-Noted on list  . Decadron [Dexamethasone]     Unknown-Noted on list  . Lipitor [Atorvastatin Calcium]     Unknown-Noted on list  . Lisinopril     Unknown-Noted on list  . Methylprednisolone     Unknown-Noted on list  . Norvasc [Amlodipine Besylate]     Unknown-Noted on list  . Prednisolone     Unknown-Noted on list  . Sectral [Acebutolol Hcl]     Unknown-Noted on list  . Suprax [Cefixime]     Unknown-Noted on list    History reviewed. Family history revealed cancer.   Prior to Admission medications   Medication Sig Start Date End Date Taking? Authorizing Provider  acetaminophen-codeine (TYLENOL #4) 300-60 MG per tablet Take 3 tablets by mouth every 6 (six) hours as needed for moderate pain.    Yes Historical Provider, MD  albuterol (PROVENTIL HFA;VENTOLIN HFA) 108 (90 BASE) MCG/ACT inhaler Inhale 2 puffs into  the lungs every 6 (six) hours as needed. For shortness of breath   Yes Historical Provider, MD  anti-nausea (EMETROL) solution Take 10 mLs by mouth every 15 (fifteen) minutes as needed for nausea or vomiting.   Yes Historical Provider, MD  aspirin 325 MG tablet Take 325 mg by mouth daily.   Yes Historical Provider, MD  benazepril (LOTENSIN) 40 MG tablet Take 40 mg by mouth daily.   Yes Historical Provider, MD  budesonide-formoterol (SYMBICORT) 160-4.5 MCG/ACT inhaler Inhale 2 puffs into the lungs 2 (two) times daily.   Yes Historical Provider, MD    carisoprodol (SOMA) 350 MG tablet Take 1,050 mg by mouth every 6 (six) hours.    Yes Historical Provider, MD  carvedilol (COREG) 6.25 MG tablet Take 6.25 mg by mouth every morning.   Yes Historical Provider, MD  cetirizine (ZYRTEC) 10 MG tablet Take 10 mg by mouth daily.   Yes Historical Provider, MD  diazepam (VALIUM) 10 MG tablet Take 10 mg by mouth at bedtime as needed for anxiety.   Yes Historical Provider, MD  ferrous sulfate 325 (65 FE) MG tablet Take 975 mg by mouth daily.    Yes Historical Provider, MD  fluticasone (FLONASE) 50 MCG/ACT nasal spray Place 1 spray into both nostrils daily.   Yes Historical Provider, MD  guaiFENesin (MUCINEX) 600 MG 12 hr tablet Take 600 mg by mouth daily.   Yes Historical Provider, MD  guaiFENesin-dextromethorphan (ROBITUSSIN DM) 100-10 MG/5ML syrup Take 5 mLs by mouth every 4 (four) hours as needed for cough.   Yes Historical Provider, MD  Multiple Vitamin (MULTIVITAMIN WITH MINERALS) TABS tablet Take 1 tablet by mouth daily.   Yes Historical Provider, MD  Omega-3 Fatty Acids (OMEGA 3 PO) Take 1 capsule by mouth daily.   Yes Historical Provider, MD  oxyCODONE-acetaminophen (PERCOCET) 10-325 MG per tablet Take 1 tablet by mouth at bedtime.   Yes Historical Provider, MD  Oxycodone-Aspirin [id number] MG TABS Take 1 tablet by mouth 3 (three) times daily as needed (for pain).   Yes Historical Provider, MD  potassium chloride SA (K-DUR,KLOR-CON) 20 MEQ tablet Take 20 mEq by mouth daily.   Yes Historical Provider, MD  Probiotic Product (ALIGN) 4 MG CAPS Take 1 capsule by mouth daily.   Yes Historical Provider, MD  prochlorperazine (COMPAZINE) 10 MG tablet Take 10 mg by mouth every 6 (six) hours as needed for nausea.    Yes Historical Provider, MD  tiotropium (SPIRIVA) 18 MCG inhalation capsule Place 18 mcg into inhaler and inhale daily.   Yes Historical Provider, MD  benazepril (LOTENSIN) 40 MG tablet Take 1 tablet (40 mg total) by mouth daily. 06/21/11 06/20/12   Simbiso Ranga, MD  furosemide (LASIX) 20 MG tablet Take 40 mg by mouth 2 (two) times daily as needed.     Historical Provider, MD  lactobacillus (FLORANEX/LACTINEX) PACK Take 1 packet (1 g total) by mouth 3 (three) times daily with meals. 06/21/11   Simbiso Ranga, MD  metoprolol tartrate (LOPRESSOR) 12.5 mg TABS Take 1 tablet (25 mg total) by mouth 2 (two) times daily. 06/21/11   Simbiso Ranga, MD  Multiple Vitamin (MULITIVITAMIN) LIQD Take 5 mLs by mouth daily.    Historical Provider, MD  nitroGLYCERIN (NITROSTAT) 0.4 MG SL tablet Place 0.4 mg under the tongue every 5 (five) minutes as needed for chest pain.     Historical Provider, MD  sodium chloride 0.9 % SOLN 500 mL with vancomycin 1000 MG SOLR 1,500 mg Inject 1,500 mg into  the vein every 12 (twelve) hours. 06/21/11   Conley Canal, MD   Physical Exam: Filed Vitals:   02/19/14 1049  BP: 159/73  Pulse: 81  Temp: 97.8 F (36.6 C)  TempSrc: Oral  Resp: 22  SpO2: 91%    Wt Readings from Last 3 Encounters:  06/16/11 141.2 kg (311 lb 4.6 oz)    General:  Appears calm and comfortable Eyes: PERRL, normal lids, irises & conjunctiva Neck: no LAD, masses or thyromegaly Cardiovascular: slightly tachycardic Respiratory: decreased air entry at bases and scattered wheezing heard.  Abdomen: soft, mild generalized tenderness, mildly distended bowel sounds heard.  Skin: healed scars on the back and heel.  Musculoskeletal: leg edema present.  Neurologic: paraplegic. Speech normal no new focal deficits.           Labs on Admission:  Basic Metabolic Panel:  Recent Labs Lab 02/19/14 1217  NA 137  K 3.6*  CL 90*  CO2 36*  GLUCOSE 113*  BUN 9  CREATININE 0.49*  CALCIUM 9.7   Liver Function Tests:  Recent Labs Lab 02/19/14 1217  AST 78*  ALT 244*  ALKPHOS 190*  BILITOT 0.8  PROT 7.6  ALBUMIN 3.3*    Recent Labs Lab 02/19/14 1217  LIPASE 54   No results for input(s): AMMONIA in the last 168 hours. CBC:  Recent  Labs Lab 02/19/14 1217  WBC 17.9*  NEUTROABS 15.1*  HGB 12.9*  HCT 40.1  MCV 90.9  PLT 164   Cardiac Enzymes:  Recent Labs Lab 02/19/14 1217  TROPONINI <0.30    BNP (last 3 results)  Recent Labs  02/19/14 1219  PROBNP 333.9*   CBG: No results for input(s): GLUCAP in the last 168 hours.  Radiological Exams on Admission: Ct Abdomen Pelvis W Contrast  02/19/2014   CLINICAL DATA:  Nausea and vomiting x 2 days with elevated WBC of unknown etiology. Nurse note: Pt is a paraplegic since 2009. "Infarct in spine" Pt had abnormal labs drawn on Thursday. Elevated WBC and elevated LFT. Pancreatitis with UTI. Pt has been n/v since Thursday morning.  EXAM: CT ABDOMEN AND PELVIS WITH CONTRAST  TECHNIQUE: Multidetector CT imaging of the abdomen and pelvis was performed using the standard protocol following bolus administration of intravenous contrast.  CONTRAST:  50mL OMNIPAQUE IOHEXOL 300 MG/ML SOLN, OMNIPAQUE IOHEXOL 300 MG/ML SOLN  COMPARISON:  06/13/2011  FINDINGS: Lower chest: Bibasilar subsegmental atelectasis. Mild cardiomegaly with LAD coronary artery atherosclerosis. No pericardial or pleural effusion. Pulmonary artery enlargement, with the outflow tract measuring 4.1 cm.  Hepatobiliary: Marked hepatomegaly. Mild degradation secondary to patient body habitus and beam hardening artifact from hardware within the lumbar spine. Cholecystectomy, without biliary ductal dilatation.  Pancreas: No pancreatic ductal dilatation. Mild peripancreatic edema suspected, including on image 36 of series 2.  Spleen: Splenomegaly, 17.5 cm craniocaudal.  Adrenals/Urinary Tract: Normal adrenal glands. Right renal interpolar dominant 9.4 cm cyst. Too small to characterize lesions in both kidneys. No hydronephrosis. Decompressed urinary bladder around a Foley catheter.  Stomach/Bowel: Normal stomach, without wall thickening. Colonic stool burden suggests constipation. Normal terminal ileum and appendix. Normal  small bowel without abdominal ascites.  Vascular/Lymphatic: Aortic and branch vessel atherosclerosis. No abdominopelvic adenopathy.  Reproductive: Normal prostate.  Other: No significant free fluid. Bilateral tiny fat containing inguinal hernias. Far left abdominal wall excluded from the study. There has been prior left lateral abdominal wall hernia repair. Soft tissue thickening superficial to the sacrum on image 82. Nonspecific. No well-defined abscess or soft tissue  gas to strongly suggest infection. Diffuse muscular atrophy.  Musculoskeletal: Osteopenia.  Lumbar spine fixation.  IMPRESSION: 1. Multifactorial degradation as detailed above. 2. Findings suspicious for mild uncomplicated pancreatitis. 3. Hepatosplenomegaly. 4. Pulmonary artery enlargement suggests pulmonary arterial hypertension. 5.  Atherosclerosis, including within the coronary arteries. 6.  Possible constipation.   Electronically Signed   By: Jeronimo Greaves M.D.   On: 02/19/2014 15:19   Dg Abd Acute W/chest  02/19/2014   CLINICAL DATA:  Vomiting and cough for 4 days. Medial and inferior abdominal pain.  EXAM: ACUTE ABDOMEN SERIES (ABDOMEN 2 VIEW & CHEST 1 VIEW)  COMPARISON:  CT chest 07/21/2012.  FINDINGS: The heart is enlarged. The lung volumes are low. No focal airspace disease is evident.  Gas and stool are present throughout the colon. There is no evidence for obstruction. No free air is present. A left lateral hernia repair is noted. Postoperative changes are evident in the lumbar spine. The most inferior screw, at L4 on the left, is fractured  IMPRESSION: 1. Nonspecific bowel gas pattern without evidence for obstruction or free air. 2. Cardiomegaly without failure. 3. Low lung volumes. 4. Evidence of left lateral hernia repair. 5. Lumbar spine surgery with fractured pedicle screw.   Electronically Signed   By: Gennette Pac M.D.   On: 02/19/2014 12:01    EKG: pending.   Assessment/Plan Active Problems:   Pancreatitis  Acute  pancreatitis: Admit to telemetry. Clear liquid diet for nutrition. Gentle hydration , pain control. Lipase levels within normal limits.  IV anti emetics as needed.   Sob and hypoxia probably from COPD: Scattered wheezing heard. Bronchodilators and nasal oxygen to keep sats >90%   Elevated liver function panel: Continue to monitor.    Leukocytosis: Probably secondary to  a combination of pancreatitis and UTI.  Monitor.    Abnormal UA:  Urine cultures sent and wills tart him on antibiotics till the cultures reveal. Bactrim ordered.    Paraplegia following spinal injury: - bed bound.  Using trapeze for transfer.    DVT prophylaxis.    Code Status: FULL CODE.  DVT Prophylaxis: Family Communication:wife at bedside Disposition Plan: admit to tele  Time spent: 65 min  [facility] Pager [phone]

## 2014-02-19 NOTE — ED Notes (Addendum)
Home health nurse called to state they are sending the patient for evaluation due to unable to eat, dx UTI, elevated WBC  19,000, Lipase and LFT's. Pt is reported to be wheelchair bound and obese. Labs drawn at home. Lab # [id number] to have results faxed.

## 2014-02-19 NOTE — ED Notes (Signed)
Per EMS, pt from home.  Pt has home health RN to see him. Pt is a paraplegic since 2009.  "Infarct in spine"  Pt had abnormal labs drawn on Thursday.  Elevated WBC and elevated LFT.  Pancreatitis with UTI.  Pt has been n/v since Thursday morning.  Vitals 150/80, hr 74, 95% ra.

## 2014-02-19 NOTE — ED Provider Notes (Signed)
CSN: [id number]     Arrival date & time 02/19/14  1039 History   First MD Initiated Contact with Patient 02/19/14 1049     Chief Complaint  Patient presents with  . Abnormal Lab  . Emesis     (Consider location/radiation/quality/duration/timing/severity/associated sxs/prior Treatment) Patient is a 63 y.o. male presenting with vomiting. The history is provided by the patient.  Emesis Associated symptoms: abdominal pain   Associated symptoms: no diarrhea   patient was sent in for laboratory abnormality. Reportedly has elevated white count, UTI, and pancreatitis. Information has been given on how to fax and get the results. Patient has home health nursing to the labs. He is paraplegic from previous complicated myelogram. He has a chronic indwelling Foley. No fevers. He has been coughing. Some mild abdominal pain. He has been vomiting. He does not drink alcohol and has had a cholecystectomy.Foley catheter changed earlier today.  Past Medical History  Diagnosis Date  . Hypertension   . Coronary artery disease    Past Surgical History  Procedure Laterality Date  . Back surgery    . Irrigation and debridement abscess  06/17/2011    Procedure: IRRIGATION AND DEBRIDEMENT ABSCESS;  Surgeon: Adolph Pollack, MD;  Location: [facility];  Service: General;  Laterality: N/A;   debridement sacral decubitus  . Hernia repair    . Appendectomy    . Cholecystectomy    . Varicose vein surgery     History reviewed. No pertinent family history. History  Substance Use Topics  . Smoking status: Never Smoker   . Smokeless tobacco: Not on file  . Alcohol Use: No    Review of Systems  Constitutional: Positive for fatigue. Negative for appetite change.  Respiratory: Positive for cough and shortness of breath.   Cardiovascular: Negative for chest pain.  Gastrointestinal: Positive for nausea, vomiting and abdominal pain. Negative for diarrhea.  Genitourinary: Negative for dysuria.  Musculoskeletal:  Negative for neck pain.  Skin: Positive for wound.  Neurological: Negative for seizures.      Allergies  Ciprofloxacin; Clonidine derivatives; Darvocet; Decadron; Lipitor; Lisinopril; Methylprednisolone; Norvasc; Prednisolone; Sectral; and Suprax  Home Medications   Prior to Admission medications   Medication Sig Start Date End Date Taking? Authorizing Provider  acetaminophen-codeine (TYLENOL #4) 300-60 MG per tablet Take 3 tablets by mouth every 6 (six) hours as needed for moderate pain.    Yes Historical Provider, MD  albuterol (PROVENTIL HFA;VENTOLIN HFA) 108 (90 BASE) MCG/ACT inhaler Inhale 2 puffs into the lungs every 6 (six) hours as needed. For shortness of breath   Yes Historical Provider, MD  anti-nausea (EMETROL) solution Take 10 mLs by mouth every 15 (fifteen) minutes as needed for nausea or vomiting.   Yes Historical Provider, MD  aspirin 325 MG tablet Take 325 mg by mouth daily.   Yes Historical Provider, MD  benazepril (LOTENSIN) 40 MG tablet Take 40 mg by mouth daily.   Yes Historical Provider, MD  budesonide-formoterol (SYMBICORT) 160-4.5 MCG/ACT inhaler Inhale 2 puffs into the lungs 2 (two) times daily.   Yes Historical Provider, MD  carisoprodol (SOMA) 350 MG tablet Take 1,050 mg by mouth every 6 (six) hours.    Yes Historical Provider, MD  carvedilol (COREG) 6.25 MG tablet Take 6.25 mg by mouth every morning.   Yes Historical Provider, MD  cetirizine (ZYRTEC) 10 MG tablet Take 10 mg by mouth daily.   Yes Historical Provider, MD  diazepam (VALIUM) 10 MG tablet Take 10 mg by mouth at bedtime as  needed for anxiety.   Yes Historical Provider, MD  ferrous sulfate 325 (65 FE) MG tablet Take 975 mg by mouth daily.    Yes Historical Provider, MD  fluticasone (FLONASE) 50 MCG/ACT nasal spray Place 1 spray into both nostrils daily.   Yes Historical Provider, MD  guaiFENesin (MUCINEX) 600 MG 12 hr tablet Take 600 mg by mouth daily.   Yes Historical Provider, MD   guaiFENesin-dextromethorphan (ROBITUSSIN DM) 100-10 MG/5ML syrup Take 5 mLs by mouth every 4 (four) hours as needed for cough.   Yes Historical Provider, MD  Multiple Vitamin (MULTIVITAMIN WITH MINERALS) TABS tablet Take 1 tablet by mouth daily.   Yes Historical Provider, MD  Omega-3 Fatty Acids (OMEGA 3 PO) Take 1 capsule by mouth daily.   Yes Historical Provider, MD  oxyCODONE-acetaminophen (PERCOCET) 10-325 MG per tablet Take 1 tablet by mouth at bedtime.   Yes Historical Provider, MD  Oxycodone-Aspirin [id number] MG TABS Take 1 tablet by mouth 3 (three) times daily as needed (for pain).   Yes Historical Provider, MD  potassium chloride SA (K-DUR,KLOR-CON) 20 MEQ tablet Take 20 mEq by mouth daily.   Yes Historical Provider, MD  Probiotic Product (ALIGN) 4 MG CAPS Take 1 capsule by mouth daily.   Yes Historical Provider, MD  prochlorperazine (COMPAZINE) 10 MG tablet Take 10 mg by mouth every 6 (six) hours as needed for nausea.    Yes Historical Provider, MD  tiotropium (SPIRIVA) 18 MCG inhalation capsule Place 18 mcg into inhaler and inhale daily.   Yes Historical Provider, MD  benazepril (LOTENSIN) 40 MG tablet Take 1 tablet (40 mg total) by mouth daily. 06/21/11 06/20/12  Simbiso Ranga, MD  furosemide (LASIX) 20 MG tablet Take 40 mg by mouth 2 (two) times daily as needed.     Historical Provider, MD  lactobacillus (FLORANEX/LACTINEX) PACK Take 1 packet (1 g total) by mouth 3 (three) times daily with meals. 06/21/11   Simbiso Ranga, MD  metoprolol tartrate (LOPRESSOR) 12.5 mg TABS Take 1 tablet (25 mg total) by mouth 2 (two) times daily. 06/21/11   Simbiso Ranga, MD  Multiple Vitamin (MULITIVITAMIN) LIQD Take 5 mLs by mouth daily.    Historical Provider, MD  nitroGLYCERIN (NITROSTAT) 0.4 MG SL tablet Place 0.4 mg under the tongue every 5 (five) minutes as needed for chest pain.     Historical Provider, MD  sodium chloride 0.9 % SOLN 500 mL with vancomycin 1000 MG SOLR 1,500 mg Inject 1,500 mg into  the vein every 12 (twelve) hours. 06/21/11   Simbiso Ranga, MD   BP 126/46 mmHg  Pulse 70  Temp(Src) 98.9 F (37.2 C) (Oral)  Resp 20  Ht 6\' 7"  (2.007 m)  Wt 400 lb 2.2 oz (181.5 kg)  BMI 45.06 kg/m2  SpO2 94% Physical Exam  Constitutional: He appears well-developed.  Eyes: Pupils are equal, round, and reactive to light.  Neck: Neck supple.  Cardiovascular: Normal rate and regular rhythm.   Pulmonary/Chest:  Diffuse harsh breath sounds.  Abdominal: He exhibits distension. There is tenderness.  Mild diffuse tenderness and distention.  Musculoskeletal: He exhibits edema.  Mild bilateral lower extremity pitting edema  Neurological: He is alert.  Patient is paraplegic at his waist level.  Skin: Skin is warm.    ED Course  Procedures (including critical care time) Labs Review Labs Reviewed  CBC WITH DIFFERENTIAL - Abnormal; Notable for the following:    WBC 17.9 (*)    Hemoglobin 12.9 (*)    Neutrophils Relative %  84 (*)    Neutro Abs 15.1 (*)    Lymphocytes Relative 9 (*)    Monocytes Absolute 1.2 (*)    All other components within normal limits  COMPREHENSIVE METABOLIC PANEL - Abnormal; Notable for the following:    Potassium 3.6 (*)    Chloride 90 (*)    CO2 36 (*)    Glucose, Bld 113 (*)    Creatinine, Ser 0.49 (*)    Albumin 3.3 (*)    AST 78 (*)    ALT 244 (*)    Alkaline Phosphatase 190 (*)    All other components within normal limits  LACTATE DEHYDROGENASE - Abnormal; Notable for the following:    LDH 251 (*)    All other components within normal limits  URINALYSIS, ROUTINE W REFLEX MICROSCOPIC - Abnormal; Notable for the following:    Color, Urine AMBER (*)    APPearance CLOUDY (*)    Hgb urine dipstick SMALL (*)    Protein, ur 100 (*)    Leukocytes, UA MODERATE (*)    All other components within normal limits  PRO B NATRIURETIC PEPTIDE - Abnormal; Notable for the following:    Pro B Natriuretic peptide (BNP) 333.9 (*)    All other components within  normal limits  BASIC METABOLIC PANEL - Abnormal; Notable for the following:    Sodium 134 (*)    Potassium 3.4 (*)    Chloride 90 (*)    Creatinine, Ser 0.47 (*)    All other components within normal limits  CBC - Abnormal; Notable for the following:    WBC 13.3 (*)    RBC 3.89 (*)    Hemoglobin 11.5 (*)    HCT 36.9 (*)    All other components within normal limits  LIPID PANEL - Abnormal; Notable for the following:    HDL 28 (*)    LDL Cholesterol 108 (*)    All other components within normal limits  COMPREHENSIVE METABOLIC PANEL - Abnormal; Notable for the following:    Sodium 132 (*)    Chloride 89 (*)    Creatinine, Ser 1.50 (*)    Albumin 2.7 (*)    ALT 110 (*)    Alkaline Phosphatase 128 (*)    GFR calc non Af Amer 48 (*)    GFR calc Af Amer 55 (*)    All other components within normal limits  URINE CULTURE  LIPASE, BLOOD  TROPONIN I  URINE MICROSCOPIC-ADD ON  LIPASE, BLOOD  BASIC METABOLIC PANEL  CBC  I-STAT CG4 LACTIC ACID, ED    Imaging Review Ct Abdomen Pelvis W Contrast  02/19/2014   CLINICAL DATA:  Nausea and vomiting x 2 days with elevated WBC of unknown etiology. Nurse note: Pt is a paraplegic since 2009. "Infarct in spine" Pt had abnormal labs drawn on Thursday. Elevated WBC and elevated LFT. Pancreatitis with UTI. Pt has been n/v since Thursday morning.  EXAM: CT ABDOMEN AND PELVIS WITH CONTRAST  TECHNIQUE: Multidetector CT imaging of the abdomen and pelvis was performed using the standard protocol following bolus administration of intravenous contrast.  CONTRAST:  50mL OMNIPAQUE IOHEXOL 300 MG/ML SOLN, 100mL OMNIPAQUE IOHEXOL 300 MG/ML SOLN  COMPARISON:  06/13/2011  FINDINGS: Lower chest: Bibasilar subsegmental atelectasis. Mild cardiomegaly with LAD coronary artery atherosclerosis. No pericardial or pleural effusion. Pulmonary artery enlargement, with the outflow tract measuring 4.1 cm.  Hepatobiliary: Marked hepatomegaly. Mild degradation secondary to patient  body habitus and beam hardening artifact from hardware within the lumbar  spine. Cholecystectomy, without biliary ductal dilatation.  Pancreas: No pancreatic ductal dilatation. Mild peripancreatic edema suspected, including on image 36 of series 2.  Spleen: Splenomegaly, 17.5 cm craniocaudal.  Adrenals/Urinary Tract: Normal adrenal glands. Right renal interpolar dominant 9.4 cm cyst. Too small to characterize lesions in both kidneys. No hydronephrosis. Decompressed urinary bladder around a Foley catheter.  Stomach/Bowel: Normal stomach, without wall thickening. Colonic stool burden suggests constipation. Normal terminal ileum and appendix. Normal small bowel without abdominal ascites.  Vascular/Lymphatic: Aortic and branch vessel atherosclerosis. No abdominopelvic adenopathy.  Reproductive: Normal prostate.  Other: No significant free fluid. Bilateral tiny fat containing inguinal hernias. Far left abdominal wall excluded from the study. There has been prior left lateral abdominal wall hernia repair. Soft tissue thickening superficial to the sacrum on image 82. Nonspecific. No well-defined abscess or soft tissue gas to strongly suggest infection. Diffuse muscular atrophy.  Musculoskeletal: Osteopenia.  Lumbar spine fixation.  IMPRESSION: 1. Multifactorial degradation as detailed above. 2. Findings suspicious for mild uncomplicated pancreatitis. 3. Hepatosplenomegaly. 4. Pulmonary artery enlargement suggests pulmonary arterial hypertension. 5.  Atherosclerosis, including within the coronary arteries. 6.  Possible constipation.   Electronically Signed   By: Jeronimo Greaves M.D.   On: 02/19/2014 15:19     EKG Interpretation   Date/Time:  Saturday February 19 2014 17:51:19 EST Ventricular Rate:  94 PR Interval:  213 QRS Duration: 108 QT Interval:  394 QTC Calculation: 493 R Axis:   -11 Text Interpretation:  Sinus rhythm Atrial premature complex Borderline  prolonged PR interval Borderline prolonged QT interval  ED PHYSICIAN  INTERPRETATION AVAILABLE IN [facility] Confirmed by TEST, Record  ([id number]) on 02/21/2014 6:56:17 AM      MDM   Final diagnoses:  Cough  Vomiting  Abdominal pain  Urinary tract infection associated with catheterization of urinary tract, initial encounter  Acute pancreatitis, unspecified pancreatitis type    Patient sent in for possible pancreatitis and elevated white count. Lab work preadmission showed a lipase of 70. On recheck was normal. CT scan done due to paralysis and tenderness and showed possible pancreatitis. Will admit to internal medicine.    Juliet Rude. Rubin Payor, MD 02/21/14 1343

## 2014-02-20 DIAGNOSIS — D72829 Elevated white blood cell count, unspecified: Secondary | ICD-10-CM

## 2014-02-20 DIAGNOSIS — K858 Other acute pancreatitis: Secondary | ICD-10-CM

## 2014-02-20 DIAGNOSIS — I10 Essential (primary) hypertension: Secondary | ICD-10-CM

## 2014-02-20 LAB — BASIC METABOLIC PANEL
ANION GAP: 12 (ref 5–15)
BUN: 9 mg/dL (ref 6–23)
CO2: 32 meq/L (ref 19–32)
Calcium: 9.2 mg/dL (ref 8.4–10.5)
Chloride: 90 mEq/L — ABNORMAL LOW (ref 96–112)
Creatinine, Ser: 0.47 mg/dL — ABNORMAL LOW (ref 0.50–1.35)
GFR calc Af Amer: >90 mL/min (ref >90)
GFR calc non Af Amer: >90 mL/min (ref >90)
Glucose, Bld: 99 mg/dL (ref 70–99)
Potassium: 3.4 mEq/L — ABNORMAL LOW (ref 3.7–5.3)
SODIUM: 134 meq/L — AB (ref 137–147)

## 2014-02-20 MED ORDER — HYDROMORPHONE HCL 1 MG/ML IJ SOLN
0.5000 mg | Freq: Once | INTRAMUSCULAR | Status: AC
Start: 2014-02-20 — End: 2014-02-20
  Administered 2014-02-20: 0.5 mg via INTRAVENOUS
  Filled 2014-02-20: qty 1

## 2014-02-20 MED ORDER — OXYCODONE HCL ER 15 MG PO T12A: 30.0000 mg | EXTENDED_RELEASE_TABLET | Freq: Two times a day (BID) | ORAL | Status: DC

## 2014-02-20 MED ORDER — OXYCODONE HCL ER 15 MG PO T12A
30.0000 mg | EXTENDED_RELEASE_TABLET | Freq: Two times a day (BID) | ORAL | Status: DC
  Administered 2014-02-20 (×2): 30 mg via ORAL
  Filled 2014-02-20 (×3): qty 2

## 2014-02-20 MED ORDER — KETOROLAC TROMETHAMINE 15 MG/ML IJ SOLN
15.0000 mg | Freq: Four times a day (QID) | INTRAMUSCULAR | Status: DC | PRN
  Administered 2014-02-20 (×2): 15 mg via INTRAVENOUS
  Filled 2014-02-20 (×3): qty 1

## 2014-02-20 MED ORDER — HYDROMORPHONE HCL 2 MG/ML IJ SOLN
2.0000 mg | Freq: Once | INTRAMUSCULAR | Status: AC
  Administered 2014-02-20: 2 mg via INTRAVENOUS
  Filled 2014-02-20: qty 1

## 2014-02-20 MED ORDER — DEXTROSE-NACL 5-0.9 % IV SOLN
INTRAVENOUS | Status: DC
  Administered 2014-02-20 – 2014-02-23 (×7): via INTRAVENOUS

## 2014-02-20 MED ORDER — ENOXAPARIN SODIUM 80 MG/0.8ML ~~LOC~~ SOLN
80.0000 mg | SUBCUTANEOUS | Status: DC
  Administered 2014-02-20 – 2014-02-23 (×4): 80 mg via SUBCUTANEOUS
  Filled 2014-02-20 (×5): qty 0.8

## 2014-02-20 MED ORDER — HYDRALAZINE HCL 10 MG PO TABS: 10.0000 mg | ORAL_TABLET | Freq: Three times a day (TID) | ORAL | Status: DC

## 2014-02-20 MED ORDER — HYDROMORPHONE HCL 2 MG/ML IJ SOLN
1.5000 mg | INTRAMUSCULAR | Status: DC | PRN
  Administered 2014-02-20 – 2014-02-23 (×9): 1.5 mg via INTRAVENOUS
  Filled 2014-02-20 (×9): qty 1

## 2014-02-20 NOTE — Plan of Care (Signed)
Problem: Phase I Progression Outcomes Goal: Pain controlled with appropriate interventions Outcome: Progressing Goal: OOB as tolerated unless otherwise ordered Outcome: Not Applicable Date Met:  22/02/54

## 2014-02-20 NOTE — Plan of Care (Signed)
Problem: Phase I Progression Outcomes Goal: Initial discharge plan identified Outcome: Progressing Goal: Hemodynamically stable Outcome: Progressing Goal: Other Phase I Outcomes/Goals Outcome: Progressing

## 2014-02-20 NOTE — Progress Notes (Signed)
Page placed to floor coverage for a one time dose of Hydromorphone 2mg  IV. Patient with uncontrolled pain, rated 9/10. Order received. Will pass on to next shift to speak with MD about pain management.

## 2014-02-20 NOTE — Progress Notes (Signed)
[facility] PROGRESS NOTE  Glendon AxeMark C Stirn ZOX:[id number] DOB: Dec 13, 1950 DOA: 02/19/2014 PCP: Shirlean MylarWEBB, CAROL, D, MD  Assessment/Plan: Principle problem: Pancreatitis - Place patient nothing by mouth - We'll place on D5 normal saline while nothing by mouth - Increase pain medication regimen as patient reports pain is not well controlled - discontinue Ace inhibitor as this has reported side effect of pancreatitis - Obtain fasting lipid panel as patient has   Active Problems:   Paraplegia following spinal cord injury - stable, continue home regimen    Anemia - stable, no active bleeding.    HTN (hypertension), malignant - discontinued ace I as explained above - last blood pressure recorded soft as such we'll not start new antihypertensive medication    Leukocytosis: Pt with abnormal U/A currently suspicion is for UTI - on bactrim - we'll await urine culture.  Hypokalemia - Replace orally and reassess next am - not well controlled currently    Elevated liver function tests - we'll reassess next a.m.    COPD (chronic obstructive pulmonary disease) - compensated currently   Code Status: full Family Communication: discussed with patient and wife Disposition Plan: pending improvement in condition.   Consultants:  none  Procedures:  none  Antibiotics:  Bactrim  HPI/Subjective: The patient has no new complaints today other than continued abdominal discomfort. Nursing reports he is asking for current pain medication around the clock. He reports minimal relief and is requesting an increase in opioid regimen.  Objective: Filed Vitals:   02/20/14 1052  BP: 163/66  Pulse: 77  Temp:   Resp:     Intake/Output Summary (Last 24 hours) at 02/20/14 1352 Last data filed at 02/20/14 0519  Gross per 24 hour  Intake      0 ml  Output    600 ml  Net   -600 ml   Filed Weights   02/19/14 1933 02/20/14 1251  Weight: 180.7 kg (398 lb 5.9 oz) 181.5 kg (400 lb 2.2 oz)     Exam:   General:  Patient in no acute distress, alert and awake  Cardiovascular: regular rate and rhythm, no murmurs or rubs  Respiratory: clear to auscultation bilaterally, no wheezes  Abdomen: positive epigastric discomfort with palpation, obese, soft  Musculoskeletal: no cyanosis on limited exam   Data Reviewed: Basic Metabolic Panel:  Recent Labs Lab 02/19/14 1217 02/20/14 0425  NA 137 134*  K 3.6* 3.4*  CL 90* 90*  CO2 36* 32  GLUCOSE 113* 99  BUN 9 9  CREATININE 0.49* 0.47*  CALCIUM 9.7 9.2   Liver Function Tests:  Recent Labs Lab 02/19/14 1217  AST 78*  ALT 244*  ALKPHOS 190*  BILITOT 0.8  PROT 7.6  ALBUMIN 3.3*    Recent Labs Lab 02/19/14 1217  LIPASE 54   No results for input(s): AMMONIA in the last 168 hours. CBC:  Recent Labs Lab 02/19/14 1217  WBC 17.9*  NEUTROABS 15.1*  HGB 12.9*  HCT 40.1  MCV 90.9  PLT 164   Cardiac Enzymes:  Recent Labs Lab 02/19/14 1217  TROPONINI <0.30   BNP (last 3 results)  Recent Labs  02/19/14 1219  PROBNP 333.9*   CBG: No results for input(s): GLUCAP in the last 168 hours.  No results found for this or any previous visit (from the past 240 hour(s)).   Studies: Ct Abdomen Pelvis W Contrast  02/19/2014   CLINICAL DATA:  Nausea and vomiting x 2 days with elevated WBC of unknown etiology. Nurse note:  Pt is a paraplegic since 2009. "Infarct in spine" Pt had abnormal labs drawn on Thursday. Elevated WBC and elevated LFT. Pancreatitis with UTI. Pt has been n/v since Thursday morning.  EXAM: CT ABDOMEN AND PELVIS WITH CONTRAST  TECHNIQUE: Multidetector CT imaging of the abdomen and pelvis was performed using the standard protocol following bolus administration of intravenous contrast.  CONTRAST:  50mL OMNIPAQUE IOHEXOL 300 MG/ML SOLN, 100mL OMNIPAQUE IOHEXOL 300 MG/ML SOLN  COMPARISON:  06/13/2011  FINDINGS: Lower chest: Bibasilar subsegmental atelectasis. Mild cardiomegaly with LAD coronary artery  atherosclerosis. No pericardial or pleural effusion. Pulmonary artery enlargement, with the outflow tract measuring 4.1 cm.  Hepatobiliary: Marked hepatomegaly. Mild degradation secondary to patient body habitus and beam hardening artifact from hardware within the lumbar spine. Cholecystectomy, without biliary ductal dilatation.  Pancreas: No pancreatic ductal dilatation. Mild peripancreatic edema suspected, including on image 36 of series 2.  Spleen: Splenomegaly, 17.5 cm craniocaudal.  Adrenals/Urinary Tract: Normal adrenal glands. Right renal interpolar dominant 9.4 cm cyst. Too small to characterize lesions in both kidneys. No hydronephrosis. Decompressed urinary bladder around a Foley catheter.  Stomach/Bowel: Normal stomach, without wall thickening. Colonic stool burden suggests constipation. Normal terminal ileum and appendix. Normal small bowel without abdominal ascites.  Vascular/Lymphatic: Aortic and branch vessel atherosclerosis. No abdominopelvic adenopathy.  Reproductive: Normal prostate.  Other: No significant free fluid. Bilateral tiny fat containing inguinal hernias. Far left abdominal wall excluded from the study. There has been prior left lateral abdominal wall hernia repair. Soft tissue thickening superficial to the sacrum on image 82. Nonspecific. No well-defined abscess or soft tissue gas to strongly suggest infection. Diffuse muscular atrophy.  Musculoskeletal: Osteopenia.  Lumbar spine fixation.  IMPRESSION: 1. Multifactorial degradation as detailed above. 2. Findings suspicious for mild uncomplicated pancreatitis. 3. Hepatosplenomegaly. 4. Pulmonary artery enlargement suggests pulmonary arterial hypertension. 5.  Atherosclerosis, including within the coronary arteries. 6.  Possible constipation.   Electronically Signed   By: Jeronimo GreavesKyle  Talbot M.D.   On: 02/19/2014 15:19   Dg Abd Acute W/chest  02/19/2014   CLINICAL DATA:  Vomiting and cough for 4 days. Medial and inferior abdominal pain.   EXAM: ACUTE ABDOMEN SERIES (ABDOMEN 2 VIEW & CHEST 1 VIEW)  COMPARISON:  CT chest 07/21/2012.  FINDINGS: The heart is enlarged. The lung volumes are low. No focal airspace disease is evident.  Gas and stool are present throughout the colon. There is no evidence for obstruction. No free air is present. A left lateral hernia repair is noted. Postoperative changes are evident in the lumbar spine. The most inferior screw, at L4 on the left, is fractured  IMPRESSION: 1. Nonspecific bowel gas pattern without evidence for obstruction or free air. 2. Cardiomegaly without failure. 3. Low lung volumes. 4. Evidence of left lateral hernia repair. 5. Lumbar spine surgery with fractured pedicle screw.   Electronically Signed   By: Gennette Pachris  Mattern M.D.   On: 02/19/2014 12:01    Scheduled Meds: . aspirin  325 mg Oral Daily  . benazepril  40 mg Oral Daily  . budesonide-formoterol  2 puff Inhalation BID  . carisoprodol  1,050 mg Oral Q6H  . carvedilol  6.25 mg Oral q morning - 10a  . enoxaparin (LOVENOX) injection  40 mg Subcutaneous Q24H  . fluticasone  1 spray Each Nare Daily  . ipratropium-albuterol  3 mL Nebulization QID  . loratadine  10 mg Oral Daily  . OxyCODONE  30 mg Oral Q12H  . potassium chloride SA  20 mEq Oral Daily  .  potassium chloride  40 mEq Oral Once  . sulfamethoxazole-trimethoprim  1 tablet Oral Q12H   Continuous Infusions:    Time spent: > 35 minutes    Penny Pia  [facility] Pager [phone] If 7PM-7AM, please contact night-coverage at [email or web address], password [facility] 02/20/2014, 1:52 PM  LOS: 1 day

## 2014-02-21 DIAGNOSIS — K853 Drug induced acute pancreatitis: Secondary | ICD-10-CM

## 2014-02-21 LAB — CBC
HCT: 36.9 % — ABNORMAL LOW (ref 39.0–52.0)
Hemoglobin: 11.5 g/dL — ABNORMAL LOW (ref 13.0–17.0)
MCH: 29.6 pg (ref 26.0–34.0)
MCHC: 31.2 g/dL (ref 30.0–36.0)
MCV: 94.9 fL (ref 78.0–100.0)
Platelets: 156 10*3/uL (ref 150–400)
RBC: 3.89 MIL/uL — ABNORMAL LOW (ref 4.22–5.81)
RDW: 14.6 % (ref 11.5–15.5)
WBC: 13.3 10*3/uL — ABNORMAL HIGH (ref 4.0–10.5)

## 2014-02-21 LAB — COMPREHENSIVE METABOLIC PANEL
ALT: 110 U/L — AB (ref 0–53)
AST: 25 U/L (ref 0–37)
Albumin: 2.7 g/dL — ABNORMAL LOW (ref 3.5–5.2)
Alkaline Phosphatase: 128 U/L — ABNORMAL HIGH (ref 39–117)
Anion gap: 12 (ref 5–15)
BUN: 22 mg/dL (ref 6–23)
CHLORIDE: 89 meq/L — AB (ref 96–112)
CO2: 31 meq/L (ref 19–32)
Calcium: 8.7 mg/dL (ref 8.4–10.5)
Creatinine, Ser: 1.5 mg/dL — ABNORMAL HIGH (ref 0.50–1.35)
GFR, EST AFRICAN AMERICAN: 55 mL/min — AB (ref >90)
GFR, EST NON AFRICAN AMERICAN: 48 mL/min — AB (ref >90)
GLUCOSE: 99 mg/dL (ref 70–99)
Potassium: 3.8 mEq/L (ref 3.7–5.3)
SODIUM: 132 meq/L — AB (ref 137–147)
Total Bilirubin: 0.4 mg/dL (ref 0.3–1.2)
Total Protein: 6.8 g/dL (ref 6.0–8.3)

## 2014-02-21 LAB — LIPASE, BLOOD: LIPASE: 34 U/L (ref 11–59)

## 2014-02-21 LAB — LIPID PANEL
Cholesterol: 163 mg/dL (ref 0–200)
HDL: 28 mg/dL — AB (ref >39)
LDL CALC: 108 mg/dL — AB (ref 0–99)
Total CHOL/HDL Ratio: 5.8 RATIO
Triglycerides: 137 mg/dL (ref <150)
VLDL: 27 mg/dL (ref 0–40)

## 2014-02-21 MED ORDER — OXYCODONE HCL 5 MG PO TABS
5.0000 mg | ORAL_TABLET | ORAL | Status: DC | PRN
  Administered 2014-02-21 – 2014-02-24 (×11): 10 mg via ORAL
  Filled 2014-02-21 (×11): qty 2

## 2014-02-21 MED ORDER — CARISOPRODOL 350 MG PO TABS
350.0000 mg | ORAL_TABLET | Freq: Three times a day (TID) | ORAL | Status: DC
  Administered 2014-02-21 – 2014-02-23 (×8): 350 mg via ORAL
  Filled 2014-02-21 (×8): qty 1

## 2014-02-21 MED ORDER — OXYCODONE HCL ER 20 MG PO T12A
40.0000 mg | EXTENDED_RELEASE_TABLET | Freq: Two times a day (BID) | ORAL | Status: DC
  Administered 2014-02-21 – 2014-02-24 (×7): 40 mg via ORAL
  Filled 2014-02-21 (×7): qty 2

## 2014-02-21 MED ORDER — BOOST / RESOURCE BREEZE PO LIQD
1.0000 | Freq: Two times a day (BID) | ORAL | Status: DC
  Administered 2014-02-22 – 2014-02-24 (×4): 1 via ORAL

## 2014-02-21 MED ORDER — FOSFOMYCIN TROMETHAMINE 3 G PO PACK
3.0000 g | PACK | Freq: Once | ORAL | Status: DC
  Filled 2014-02-21: qty 3

## 2014-02-21 NOTE — Plan of Care (Signed)
Problem: Phase I Progression Outcomes Goal: Pain controlled with appropriate interventions Outcome: Progressing     

## 2014-02-21 NOTE — Progress Notes (Addendum)
INITIAL NUTRITION ASSESSMENT  DOCUMENTATION CODES Per approved criteria  -Morbid Obesity   INTERVENTION: Clear Liquid diet with advancement to low fat per MD Resource Breeze tid RD to follow  NUTRITION DIAGNOSIS: Inadequate oral intake related to altered gi function as evidenced by clear liquid diet..   Goal: Meet >90% estimated needs with po diet.  Monitor:  Diet advancement and tolerance, intake adequacy,  labs, weight trend  Reason for Assessment: Low Braden  63 y.o. male  Admitting Dx: <principal problem not specified>  ASSESSMENT: Patient with hx of paraplegia following spinal cord surgery. Admitted with pancreatitis. Tolerating Clear liquid diet. Gave sample of Resource Breeze mixed with gingerale.  Patient likes this.   Follows low fat, low CHO, low sodium diet at home. "My wife makes sure." Lactose intolerant "Ensure has too much fat in it." States last eating well approximately 5 days ago. Reports skin is WDL.  Sore healed "but it took a couple of years to heal". Patient with an eighty lb weight gain in the last 2 1/2 years secondary to bed bound.  Height: Ht Readings from Last 1 Encounters:  02/19/14 6\' 7"  (2.007 m)    Weight: Wt Readings from Last 1 Encounters:  02/20/14 400 lb 2.2 oz (181.5 kg)    Ideal Body Weight: 220 lbs  % Ideal Body Weight: 182  Wt Readings from Last 10 Encounters:  02/20/14 400 lb 2.2 oz (181.5 kg)  06/16/11 311 lb 4.6 oz (141.2 kg)    Usual Body Weight: 400 lbs  % Usual Body Weight: 100  BMI:  Body mass index is 45.06 kg/(m^2).  Estimated Nutritional Needs: Kcal: 1600-1800 Protein: 130-140 gm Fluid: >/=2.6L daily  Skin: WDL  Diet Order: Diet clear liquid  EDUCATION NEEDS: -Education needs addressed   Intake/Output Summary (Last 24 hours) at 02/21/14 1531 Last data filed at 02/21/14 0700  Gross per 24 hour  Intake   1600 ml  Output    200 ml  Net   1400 ml     Labs:   Recent Labs Lab  02/19/14 1217 02/20/14 0425 02/21/14 0408  NA 137 134* 132*  K 3.6* 3.4* 3.8  CL 90* 90* 89*  CO2 36* 32 31  BUN 9 9 22   CREATININE 0.49* 0.47* 1.50*  CALCIUM 9.7 9.2 8.7  GLUCOSE 113* 99 99    CBG (last 3)  No results for input(s): GLUCAP in the last 72 hours.  Scheduled Meds: . aspirin  325 mg Oral Daily  . budesonide-formoterol  2 puff Inhalation BID  . carisoprodol  350 mg Oral TID  . carvedilol  6.25 mg Oral q morning - 10a  . enoxaparin (LOVENOX) injection  80 mg Subcutaneous Q24H  . fluticasone  1 spray Each Nare Daily  . ipratropium-albuterol  3 mL Nebulization QID  . loratadine  10 mg Oral Daily  . OxyCODONE  40 mg Oral Q12H  . potassium chloride SA  20 mEq Oral Daily  . potassium chloride  40 mEq Oral Once  . sulfamethoxazole-trimethoprim  1 tablet Oral Q12H    Continuous Infusions: . dextrose 5 % and 0.9% NaCl 100 mL/hr at 02/21/14 1140    Past Medical History  Diagnosis Date  . Hypertension   . Coronary artery disease     Past Surgical History  Procedure Laterality Date  . Back surgery    . Irrigation and debridement abscess  06/17/2011    Procedure: IRRIGATION AND DEBRIDEMENT ABSCESS;  Surgeon: Adolph Pollackodd J Rosenbower, MD;  Location:  [facility];  Service: General;  Laterality: N/A;   debridement sacral decubitus  . Hernia repair    . Appendectomy    . Cholecystectomy    . Varicose vein surgery      Oran ReinLaura Jobe, RD, LDN Clinical Inpatient Dietitian Pager:  [phone] Weekend and after hours pager:  [phone]

## 2014-02-21 NOTE — Progress Notes (Signed)
[facility] PROGRESS NOTE  Glendon AxeMark C Wycoff ZOX:[id number] DOB: 1951/03/03 DOA: 02/19/2014 PCP: Shirlean MylarWEBB, CAROL, D, MD  Assessment/Plan: Principle problem: Pancreatitis - advance diet to clear liquid diet. - We'll place on D5 normal saline while nothing by mouth - Increase pain medication regimen as patient reports pain is not well controlled. He does have a history of opioid use which most likely explains tolerance and high opioid medication requirements - discontinue Ace inhibitor as this has reported side effect of pancreatitis - fasting lipid panel reviewed  And not consider source of pancreatitis  Active Problems:   Paraplegia following spinal cord injury - stable - will change muscle relaxer regimen to reported recommended doses    Anemia - stable, no active bleeding.    HTN (hypertension), malignant - discontinued ace I as explained above - last blood pressure recorded soft as such we'll not start new antihypertensive medication    Leukocytosis: Pt with abnormal U/A currently suspicion is for UTI - UTI with urine culture growing Pseudomonas. Discuss with pharmacy and plans will be to discontinue Bactrim and administer fosfomycin - we'll await urine culture.  Hypokalemia - Replace orally and reassess next am - not well controlled currently    Elevated liver function tests - we'll reassess next a.m.    COPD (chronic obstructive pulmonary disease) - compensated currently   Code Status: full Family Communication: discussed with patient and wife Disposition Plan: pending improvement in condition.   Consultants:  none  Procedures:  none  Antibiotics:  Bactrim>>>11/16  Fosfomycin X 1 11/16  HPI/Subjective: The patient has no new complaints today other than continued abdominal discomfort which he reports is still not well controlled.  Objective: Filed Vitals:   02/21/14 1432  BP: 116/48  Pulse:   Temp: 98.5 F (36.9 C)  Resp: 20    Intake/Output  Summary (Last 24 hours) at 02/21/14 1729 Last data filed at 02/21/14 0700  Gross per 24 hour  Intake   1600 ml  Output    200 ml  Net   1400 ml   Filed Weights   02/19/14 1933 02/20/14 1251  Weight: 180.7 kg (398 lb 5.9 oz) 181.5 kg (400 lb 2.2 oz)    Exam:   General:  Patient in no acute distress, alert and awake  Cardiovascular: regular rate and rhythm, no murmurs or rubs  Respiratory: clear to auscultation bilaterally, no wheezes  Abdomen: positive epigastric discomfort with palpation, obese, soft  Musculoskeletal: no cyanosis on limited exam   Data Reviewed: Basic Metabolic Panel:  Recent Labs Lab 02/19/14 1217 02/20/14 0425 02/21/14 0408  NA 137 134* 132*  K 3.6* 3.4* 3.8  CL 90* 90* 89*  CO2 36* 32 31  GLUCOSE 113* 99 99  BUN 9 9 22   CREATININE 0.49* 0.47* 1.50*  CALCIUM 9.7 9.2 8.7   Liver Function Tests:  Recent Labs Lab 02/19/14 1217 02/21/14 0408  AST 78* 25  ALT 244* 110*  ALKPHOS 190* 128*  BILITOT 0.8 0.4  PROT 7.6 6.8  ALBUMIN 3.3* 2.7*    Recent Labs Lab 02/19/14 1217 02/21/14 0408  LIPASE 54 34   No results for input(s): AMMONIA in the last 168 hours. CBC:  Recent Labs Lab 02/19/14 1217 02/21/14 0408  WBC 17.9* 13.3*  NEUTROABS 15.1*  --   HGB 12.9* 11.5*  HCT 40.1 36.9*  MCV 90.9 94.9  PLT 164 156   Cardiac Enzymes:  Recent Labs Lab 02/19/14 1217  TROPONINI <0.30   BNP (last 3 results)  Recent Labs  02/19/14 1219  PROBNP 333.9*   CBG: No results for input(s): GLUCAP in the last 168 hours.  Recent Results (from the past 240 hour(s))  Urine culture     Status: None (Preliminary result)   Collection Time: 02/19/14 12:09 PM  Result Value Ref Range Status   Specimen Description URINE, CATHETERIZED  Final   Special Requests NONE  Final   Culture  Setup Time   Final    02/19/2014 20:49 Performed at [facility]    Colony Count   Final    50,000 COLONIES/ML Performed at [facility]     Culture   Final    PSEUDOMONAS AERUGINOSA STAPHYLOCOCCUS SPECIES Note: RIFAMPIN AND GENTAMICIN SHOULD NOT BE USED AS SINGLE DRUGS FOR TREATMENT OF STAPH INFECTIONS. Performed at [facility]    Report Status PENDING  Incomplete   Organism ID, Bacteria PSEUDOMONAS AERUGINOSA  Final      Susceptibility   Pseudomonas aeruginosa - MIC*    CEFEPIME 4 SENSITIVE Sensitive     CEFTAZIDIME 4 SENSITIVE Sensitive     CIPROFLOXACIN 0.5 SENSITIVE Sensitive     GENTAMICIN <=1 SENSITIVE Sensitive     IMIPENEM 1 SENSITIVE Sensitive     PIP/TAZO 32 SENSITIVE Sensitive     TOBRAMYCIN <=1 SENSITIVE Sensitive     * PSEUDOMONAS AERUGINOSA     Studies: No results found.  Scheduled Meds: . aspirin  325 mg Oral Daily  . budesonide-formoterol  2 puff Inhalation BID  . carisoprodol  350 mg Oral TID  . carvedilol  6.25 mg Oral q morning - 10a  . enoxaparin (LOVENOX) injection  80 mg Subcutaneous Q24H  . feeding supplement (RESOURCE BREEZE)  1 Container Oral BID BM  . fluticasone  1 spray Each Nare Daily  . fosfomycin  3 g Oral Once  . ipratropium-albuterol  3 mL Nebulization QID  . loratadine  10 mg Oral Daily  . OxyCODONE  40 mg Oral Q12H  . potassium chloride SA  20 mEq Oral Daily  . potassium chloride  40 mEq Oral Once   Continuous Infusions: . dextrose 5 % and 0.9% NaCl 100 mL/hr at 02/21/14 1140     Time spent: > 35 minutes    Penny PiaVEGA, Emmilia Sowder  [facility] Pager [phone] If 7PM-7AM, please contact night-coverage at [email or web address], password [email or web address] 02/21/2014, 5:29 PM  LOS: 2 days

## 2014-02-21 NOTE — Plan of Care (Signed)
Problem: Phase II Progression Outcomes Goal: Progress activity as tolerated unless otherwise ordered Outcome: Progressing Goal: Discharge plan established Outcome: Progressing Goal: Vital signs remain stable Outcome: Completed/Met Date Met:  02/21/14 Goal: IV changed to normal saline lock Outcome: Progressing Goal: Other Phase II Outcomes/Goals Outcome: Progressing  Problem: Phase III Progression Outcomes Goal: Pain controlled on oral analgesia Outcome: Progressing

## 2014-02-22 DIAGNOSIS — J42 Unspecified chronic bronchitis: Secondary | ICD-10-CM

## 2014-02-22 LAB — CBC
HCT: 37.2 % — ABNORMAL LOW (ref 39.0–52.0)
Hemoglobin: 11.7 g/dL — ABNORMAL LOW (ref 13.0–17.0)
MCH: 29.5 pg (ref 26.0–34.0)
MCHC: 31.5 g/dL (ref 30.0–36.0)
MCV: 93.7 fL (ref 78.0–100.0)
PLATELETS: 176 10*3/uL (ref 150–400)
RBC: 3.97 MIL/uL — ABNORMAL LOW (ref 4.22–5.81)
RDW: 13.7 % (ref 11.5–15.5)
WBC: 10.5 10*3/uL (ref 4.0–10.5)

## 2014-02-22 LAB — COMPREHENSIVE METABOLIC PANEL
ALBUMIN: 2.9 g/dL — AB (ref 3.5–5.2)
ALT: 81 U/L — ABNORMAL HIGH (ref 0–53)
AST: 21 U/L (ref 0–37)
Alkaline Phosphatase: 128 U/L — ABNORMAL HIGH (ref 39–117)
Anion gap: 11 (ref 5–15)
BUN: 15 mg/dL (ref 6–23)
CALCIUM: 8.9 mg/dL (ref 8.4–10.5)
CO2: 33 mEq/L — ABNORMAL HIGH (ref 19–32)
Chloride: 95 mEq/L — ABNORMAL LOW (ref 96–112)
Creatinine, Ser: 0.63 mg/dL (ref 0.50–1.35)
GFR calc Af Amer: >90 mL/min (ref >90)
Glucose, Bld: 141 mg/dL — ABNORMAL HIGH (ref 70–99)
Potassium: 3.7 mEq/L (ref 3.7–5.3)
SODIUM: 139 meq/L (ref 137–147)
Total Bilirubin: 0.4 mg/dL (ref 0.3–1.2)
Total Protein: 7.1 g/dL (ref 6.0–8.3)

## 2014-02-22 LAB — URINE CULTURE: Colony Count: 50000

## 2014-02-22 MED ORDER — SENNA 8.6 MG PO TABS
1.0000 | ORAL_TABLET | Freq: Every day | ORAL | Status: DC
  Administered 2014-02-22 – 2014-02-24 (×2): 8.6 mg via ORAL
  Filled 2014-02-22 (×3): qty 1

## 2014-02-22 MED ORDER — FOSFOMYCIN TROMETHAMINE 3 G PO PACK
3.0000 g | PACK | Freq: Once | ORAL | Status: AC
  Administered 2014-02-22: 3 g via ORAL
  Filled 2014-02-22: qty 3

## 2014-02-22 MED ORDER — SULFAMETHOXAZOLE-TRIMETHOPRIM 800-160 MG PO TABS
1.0000 | ORAL_TABLET | Freq: Two times a day (BID) | ORAL | Status: DC
  Administered 2014-02-22 – 2014-02-24 (×4): 1 via ORAL
  Filled 2014-02-22 (×5): qty 1

## 2014-02-22 NOTE — Progress Notes (Signed)
CRITICAL VALUE ALERT  Critical value received:  Urine from sample taken from catheter on 11/14?15 is growing MRSA  Date of notification:  02/22/2014   Time of notification:  1314  Critical value read back: yes  Nurse who received alert:  Cyndy Freezeeutsch, Taija Mathias   MD notified (1st page):  Dr. Cena BentonVega  Time of first page:  1316  MD notified (2nd page):  Time of second page:  Responding MD:  Dr. Cena BentonVega  Time MD responded:

## 2014-02-22 NOTE — Plan of Care (Signed)
Problem: Phase I Progression Outcomes Goal: Voiding-avoid urinary catheter unless indicated Outcome: Not Met (add Reason) Pt has a chronic foley catheter  Goal: Other Phase I Outcomes/Goals Outcome: Completed/Met Date Met:  02/22/14

## 2014-02-22 NOTE — Progress Notes (Signed)
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 Who presented to the ED complaining of abdominal pain nausea and vomiting. Reportedly had CT of abdomen that revealed a non-complicated pancreatitis and he was subsequently referred [place] for medical management.  Assessment/Plan: Principle problem: Pancreatitis - advance diet to clear liquid diet. - We'll place on D5 normal saline while nothing by mouth - Increase pain medication regimen as patient reports pain is not well controlled. He does have a history of opioid use which most likely explains tolerance and high opioid medication requirements - Discontinue Ace inhibitor as this has reported side effect of pancreatitis - fasting lipid panel reviewed  And not considered source of pancreatitis  Active Problems:   Paraplegia following spinal cord injury - stable - will change muscle relaxer regimen to reported recommended doses    Anemia - stable, no active bleeding.    HTN (hypertension), malignant - discontinued ace I as explained above - Currently on carvedilol    Leukocytosis: Pt with abnormal U/A currently suspicion is for UTI - UTI with urine culture growing Pseudomonas and MRSA.  -  Fosfomycin administered - In light of MRSA urine infection we'll continue Bactrim  Hypokalemia - resolved after oral replacement    Elevated liver function tests - we'll reassess next a.m.    COPD (chronic obstructive pulmonary disease) - compensated currently   Code Status: full Family Communication: discussed with patient and wife Disposition Plan: pending improvement in condition.   Consultants:  none  Procedures:  none  Antibiotics:  Bactrim>>>11/16  Fosfomycin X 1  11/16  HPI/Subjective: Patient reports continued back pain. According to nursing and on my exam he seems sleepy. But he is able to make conversation and is alert during discussion. I discussed not wanting to increase his pain medication because he is appeared to be sleepier  Objective: Filed Vitals:   02/22/14 1437  BP: 155/51  Pulse: 67  Temp: 98.1 F (36.7 C)  Resp: 20    Intake/Output Summary (Last 24 hours) at 02/22/14 1451 Last data filed at 02/22/14 1440  Gross per 24 hour  Intake   2420 ml  Output   2950 ml  Net   -530 ml   Filed Weights   02/19/14 1933 02/20/14 1251  Weight: 180.7 kg (398 lb 5.9 oz) 181.5 kg (400 lb 2.2 oz)    Exam:   General:  Patient in no acute distress, alert and awake  Cardiovascular: regular rate and rhythm, no murmurs or rubs  Respiratory: clear to auscultation bilaterally, no wheezes  Abdomen: positive epigastric discomfort with palpation, obese, soft  Musculoskeletal: no cyanosis on limited exam   Data Reviewed: Basic Metabolic Panel:  Recent Labs Lab 02/19/14 1217 02/20/14 0425 02/21/14 0408 02/22/14 0526  NA 137 134* 132* 139  K 3.6* 3.4* 3.8 3.7  CL 90* 90* 89* 95*  CO2 36* 32 31 33*  GLUCOSE 113* 99 99 141*  BUN 9 9 22 15   CREATININE 0.49* 0.47* 1.50* 0.63  CALCIUM 9.7 9.2 8.7 8.9   Liver Function Tests:  Recent Labs Lab 02/19/14 1217 02/21/14 0408 02/22/14 0526  AST 78* 25 21  ALT 244* 110* 81*  ALKPHOS 190* 128* 128*  BILITOT 0.8 0.4 0.4  PROT 7.6 6.8 7.1  ALBUMIN 3.3* 2.7* 2.9*  Recent Labs Lab 02/19/14 1217 02/21/14 0408  LIPASE 54 34   No results for input(s): AMMONIA in the last 168 hours. CBC:  Recent Labs Lab 02/19/14 1217 02/21/14 0408 02/22/14 0526  WBC 17.9* 13.3* 10.5  NEUTROABS 15.1*  --   --   HGB 12.9* 11.5* 11.7*  HCT 40.1 36.9* 37.2*  MCV 90.9 94.9 93.7  PLT 164 156 176   Cardiac Enzymes:  Recent Labs Lab 02/19/14 1217  TROPONINI <0.30   BNP (last 3  results)  Recent Labs  02/19/14 1219  PROBNP 333.9*   CBG: No results for input(s): GLUCAP in the last 168 hours.  Recent Results (from the past 240 hour(s))  Urine culture     Status: None   Collection Time: 02/19/14 12:09 PM  Result Value Ref Range Status   Specimen Description URINE, CATHETERIZED  Final   Special Requests NONE  Final   Culture  Setup Time   Final    02/19/2014 20:49 Performed at [facility]    Colony Count   Final    50,000 COLONIES/ML Performed at [facility]    Culture   Final    PSEUDOMONAS AERUGINOSA METHICILLIN RESISTANT STAPHYLOCOCCUS AUREUS Note: RIFAMPIN AND GENTAMICIN SHOULD NOT BE USED AS SINGLE DRUGS FOR TREATMENT OF STAPH INFECTIONS. CRITICAL RESULT CALLED TO, READ BACK BY AND VERIFIED WITH: JESSICA 11/17 AT 1310 BY DUNNJ Performed at [facility]    Report Status 02/22/2014 FINAL  Final   Organism ID, Bacteria PSEUDOMONAS AERUGINOSA  Final   Organism ID, Bacteria METHICILLIN RESISTANT STAPHYLOCOCCUS AUREUS  Final      Susceptibility   Pseudomonas aeruginosa - MIC*    CEFEPIME 4 SENSITIVE Sensitive     CEFTAZIDIME 4 SENSITIVE Sensitive     CIPROFLOXACIN 0.5 SENSITIVE Sensitive     GENTAMICIN <=1 SENSITIVE Sensitive     IMIPENEM 1 SENSITIVE Sensitive     PIP/TAZO 32 SENSITIVE Sensitive     TOBRAMYCIN* <=1 SENSITIVE Sensitive      * SET UP TIME:  [id number]    * PSEUDOMONAS AERUGINOSA   Methicillin resistant staphylococcus aureus - MIC*    GENTAMICIN <=0.5 SENSITIVE Sensitive     LEVOFLOXACIN >=8 RESISTANT Resistant     NITROFURANTOIN <=16 SENSITIVE Sensitive     OXACILLIN >=4 RESISTANT Resistant     PENICILLIN >=0.5 RESISTANT Resistant     RIFAMPIN <=0.5 SENSITIVE Sensitive     TRIMETH/SULFA <=10 SENSITIVE Sensitive     VANCOMYCIN 1 SENSITIVE Sensitive     TETRACYCLINE <=1 SENSITIVE Sensitive     * METHICILLIN RESISTANT STAPHYLOCOCCUS AUREUS     Studies: No results found.  Scheduled Meds: . aspirin   325 mg Oral Daily  . budesonide-formoterol  2 puff Inhalation BID  . carisoprodol  350 mg Oral TID  . carvedilol  6.25 mg Oral q morning - 10a  . enoxaparin (LOVENOX) injection  80 mg Subcutaneous Q24H  . feeding supplement (RESOURCE BREEZE)  1 Container Oral BID BM  . fluticasone  1 spray Each Nare Daily  . ipratropium-albuterol  3 mL Nebulization QID  . loratadine  10 mg Oral Daily  . OxyCODONE  40 mg Oral Q12H  . potassium chloride SA  20 mEq Oral Daily  . potassium chloride  40 mEq Oral Once  . sulfamethoxazole-trimethoprim  1 tablet Oral Q12H   Continuous Infusions: . dextrose 5 % and 0.9% NaCl 100 mL/hr at 02/22/14 0801     Time spent: > 35 minutes  Penny PiaVEGA, French Kendra  [facility] Pager [phone] If 7PM-7AM, please contact night-coverage at [email or web address], password [email or web address] 02/22/2014, 2:51 PM  LOS: 3 days

## 2014-02-22 NOTE — Plan of Care (Signed)
Problem: Phase I Progression Outcomes Goal: Pain controlled with appropriate interventions Outcome: Progressing Goal: Hemodynamically stable Outcome: Progressing BPs running soft, but otherwise stable

## 2014-02-23 ENCOUNTER — Inpatient Hospital Stay (HOSPITAL_COMMUNITY): Payer: 59

## 2014-02-23 DIAGNOSIS — D649 Anemia, unspecified: Secondary | ICD-10-CM

## 2014-02-23 MED ORDER — DM-GUAIFENESIN ER 30-600 MG PO TB12
1.0000 | ORAL_TABLET | Freq: Two times a day (BID) | ORAL | Status: DC
  Administered 2014-02-23 – 2014-02-24 (×3): 1 via ORAL
  Filled 2014-02-23 (×4): qty 1

## 2014-02-23 MED ORDER — PROMETHAZINE HCL 25 MG/ML IJ SOLN
12.5000 mg | Freq: Once | INTRAMUSCULAR | Status: AC
  Administered 2014-02-23: 12.5 mg via INTRAVENOUS
  Filled 2014-02-23: qty 1

## 2014-02-23 NOTE — Plan of Care (Signed)
Problem: Phase II Progression Outcomes Goal: Obtain order to discontinue catheter if appropriate Outcome: Not Met (add Reason) Pt has a chronic foley  Goal: Other Phase II Outcomes/Goals Outcome: Completed/Met Date Met:  02/23/14     

## 2014-02-23 NOTE — Progress Notes (Signed)
[facility]                                                                              Patient Demographics  [name], is a 63 y.o. male, DOB - Aug 08, 1950, WUJ:[id number]  Admit date - 02/19/2014   Admitting Physician Kathlen ModyVijaya Akula, MD  Outpatient Primary MD for the patient is WEBB, Estill BattenAROL, D, MD  LOS - 4   Chief Complaint  Patient presents with  . Abnormal Lab  . Emesis      HPI on 02/19/2014 63 year old with a history of paraplegia, history of COPD, hypertension, squama cell carcinoma of the skin, coronary artery disease presented for abdominal pain, nausea and vomiting. Patient was seen by his primary care physician 2 days before admission was asked to come to emergency department for abnormal labs. Patient was found to have leukocytosis and CT of the abdomen revealed noncompensated pancreatitis. Patient was referred to medical service for admission. Upon in admission, patient was found to have low potassium and elevated liver function tests. His lactic acid was normal. His urinary analysis was also abnormal and culture was sent. Patient was found to be hypoxic with oxygen saturations 88% upon arrival. Chest x-ray did not reveal pneumonia or heart failure. He was placed on 2 L of nasal cannula.  Assessment & Plan   Acute mild pancreatitis -CT of the abdomen: mild pancreatitis -Patient was placed on clear liquid diet and has tolerated well -Will continue to advance his diet as tolerated -continue pain medication regimen, however patient does have history of opioid use with tolerance -ACE inhibitor was discontinued due to his pancreatitis -Triglyceride level: 137, lipase 34 -Patient denies alcohol use  Paraplegia following spinal cord injury -Currently stable -Continue muscle relaxer  Normocytic anemia  -Hemoglobin appears to be stable, no active bleeding -Continue to monitor CBC  Leukocytosis -Likely secondary to urinary tract infection, urine culture showed  Pseudomonas and MRSA -Due to multiple drug allergies, fosfomycin given -Patient was continued on Bactrim for MRSA  Hypokalemia -resolved  Elevated liver function tests -Improving, continue to monitor  COPD -Appears stable and compensated -continue Symbicort, DuoNeb treatment  Cough -Will repeat chest x-ray and order cough suppressant  Chronic back pain -Continue oxycodone, soma  Code Status: full  Family Communication: none at bedside  Disposition Plan: admitted  Time Spent in minutes   polarity minutes  Procedures  none  Consults   none  DVT Prophylaxis  Lovenox  Lab Results  Component Value Date   PLT 176 02/22/2014    Medications  Scheduled Meds: . aspirin  325 mg Oral Daily  . budesonide-formoterol  2 puff Inhalation BID  . carisoprodol  350 mg Oral TID  . carvedilol  6.25 mg Oral q morning - 10a  . enoxaparin (LOVENOX) injection  80 mg Subcutaneous Q24H  . feeding supplement (RESOURCE BREEZE)  1 Container Oral BID BM  . fluticasone  1 spray Each Nare Daily  . ipratropium-albuterol  3 mL Nebulization QID  . loratadine  10 mg Oral Daily  . OxyCODONE  40 mg Oral Q12H  . potassium chloride SA  20 mEq Oral Daily  . potassium chloride  40 mEq Oral Once  .  senna  1 tablet Oral Daily  . sulfamethoxazole-trimethoprim  1 tablet Oral Q12H   Continuous Infusions: . dextrose 5 % and 0.9% NaCl 100 mL/hr at 02/23/14 0736   PRN Meds:.albuterol, diazepam, HYDROmorphone (DILAUDID) injection, nitroGLYCERIN, ondansetron **OR** ondansetron (ZOFRAN) IV, oxyCODONE  Antibiotics    Anti-infectives    Start     Dose/Rate Route Frequency Ordered Stop   02/22/14 1800  sulfamethoxazole-trimethoprim (BACTRIM DS,SEPTRA DS) 800-160 MG per tablet 1 tablet     1 tablet Oral Every 12 hours 02/22/14 1448     02/19/14 2200  sulfamethoxazole-trimethoprim (BACTRIM DS,SEPTRA DS) 800-160 MG per tablet 1 tablet  Status:  Discontinued     1 tablet Oral Every 12 hours 02/19/14 1738  02/21/14 1537        Subjective:   [name] seen and examined today.  Patient states he was able to tolerate history low as well as Popsicle this morning. He continues to complain of some abdominal pain which is worsened by coughing. He denies any chest pain or palpitations or dizziness. Patient  Stated he has had this cough for approximately 1 week. Denies nausea or vomiting. Does complain of chronic back pain.  Objective:   Filed Vitals:   02/22/14 1637 02/22/14 2215 02/23/14 0517 02/23/14 0744  BP:  136/50 150/94   Pulse:  58 60   Temp:  99.8 F (37.7 C) 97.9 F (36.6 C)   TempSrc:  Oral Oral   Resp:  20 20   Height:      Weight:      SpO2: 95% 92% 94% 97%    Wt Readings from Last 3 Encounters:  02/20/14 181.5 kg (400 lb 2.2 oz)  06/16/11 141.2 kg (311 lb 4.6 oz)     Intake/Output Summary (Last 24 hours) at 02/23/14 1039 Last data filed at 02/23/14 0700  Gross per 24 hour  Intake   2660 ml  Output   2200 ml  Net    460 ml    Exam  General: Well developed, well nourished, NAD, appears stated age  HEENT: NCAT,  mucous membranes moist.   Cardiovascular: S1 S2 auscultated, no rubs, murmurs or gallops. Regular rate and rhythm.  Respiratory: Clear to auscultation bilaterally with equal chest rise  Abdomen: Soft,obese, epigastric tenderness with palpation, nondistended, + bowel sounds  Extremities: warm dry without cyanosis clubbing   Neuro: AAOx3, cranial nerves grossly intact. LE paraplegia  Skin: Without rashes exudates or nodules  Psych: appropriate mood and affect    Data Review   Micro Results Recent Results (from the past 240 hour(s))  Urine culture     Status: None   Collection Time: 02/19/14 12:09 PM  Result Value Ref Range Status   Specimen Description URINE, CATHETERIZED  Final   Special Requests NONE  Final   Culture  Setup Time   Final    02/19/2014 20:49 Performed at [facility]    Colony Count   Final    50,000  COLONIES/ML Performed at [facility]    Culture   Final    PSEUDOMONAS AERUGINOSA METHICILLIN RESISTANT STAPHYLOCOCCUS AUREUS Note: RIFAMPIN AND GENTAMICIN SHOULD NOT BE USED AS SINGLE DRUGS FOR TREATMENT OF STAPH INFECTIONS. CRITICAL RESULT CALLED TO, READ BACK BY AND VERIFIED WITH: JESSICA 11/17 AT 1310 BY [facility]UNNJ Performed at [facility]    Report Status 02/22/2014 FINAL  Final   Organism ID, Bacteria PSEUDOMONAS AERUGINOSA  Final   Organism ID, Bacteria METHICILLIN RESISTANT STAPHYLOCOCCUS AUREUS  Final  Susceptibility   Pseudomonas aeruginosa - MIC*    CEFEPIME 4 SENSITIVE Sensitive     CEFTAZIDIME 4 SENSITIVE Sensitive     CIPROFLOXACIN 0.5 SENSITIVE Sensitive     GENTAMICIN <=1 SENSITIVE Sensitive     IMIPENEM 1 SENSITIVE Sensitive     PIP/TAZO 32 SENSITIVE Sensitive     TOBRAMYCIN* <=1 SENSITIVE Sensitive      * SET UP TIME:  [id number]    * PSEUDOMONAS AERUGINOSA   Methicillin resistant staphylococcus aureus - MIC*    GENTAMICIN <=0.5 SENSITIVE Sensitive     LEVOFLOXACIN >=8 RESISTANT Resistant     NITROFURANTOIN <=16 SENSITIVE Sensitive     OXACILLIN >=4 RESISTANT Resistant     PENICILLIN >=0.5 RESISTANT Resistant     RIFAMPIN <=0.5 SENSITIVE Sensitive     TRIMETH/SULFA <=10 SENSITIVE Sensitive     VANCOMYCIN 1 SENSITIVE Sensitive     TETRACYCLINE <=1 SENSITIVE Sensitive     * METHICILLIN RESISTANT STAPHYLOCOCCUS AUREUS    Radiology Reports Ct Abdomen Pelvis W Contrast  02/19/2014   CLINICAL DATA:  Nausea and vomiting x 2 days with elevated WBC of unknown etiology. Nurse note: Pt is a paraplegic since 2009. "Infarct in spine" Pt had abnormal labs drawn on Thursday. Elevated WBC and elevated LFT. Pancreatitis with UTI. Pt has been n/v since Thursday morning.  EXAM: CT ABDOMEN AND PELVIS WITH CONTRAST  TECHNIQUE: Multidetector CT imaging of the abdomen and pelvis was performed using the standard protocol following bolus administration of  intravenous contrast.  CONTRAST:  50mL OMNIPAQUE IOHEXOL 300 MG/ML SOLN, OMNIPAQUE IOHEXOL 300 MG/ML SOLN  COMPARISON:  06/13/2011  FINDINGS: Lower chest: Bibasilar subsegmental atelectasis. Mild cardiomegaly with LAD coronary artery atherosclerosis. No pericardial or pleural effusion. Pulmonary artery enlargement, with the outflow tract measuring 4.1 cm.  Hepatobiliary: Marked hepatomegaly. Mild degradation secondary to patient body habitus and beam hardening artifact from hardware within the lumbar spine. Cholecystectomy, without biliary ductal dilatation.  Pancreas: No pancreatic ductal dilatation. Mild peripancreatic edema suspected, including on image 36 of series 2.  Spleen: Splenomegaly, 17.5 cm craniocaudal.  Adrenals/Urinary Tract: Normal adrenal glands. Right renal interpolar dominant 9.4 cm cyst. Too small to characterize lesions in both kidneys. No hydronephrosis. Decompressed urinary bladder around a Foley catheter.  Stomach/Bowel: Normal stomach, without wall thickening. Colonic stool burden suggests constipation. Normal terminal ileum and appendix. Normal small bowel without abdominal ascites.  Vascular/Lymphatic: Aortic and branch vessel atherosclerosis. No abdominopelvic adenopathy.  Reproductive: Normal prostate.  Other: No significant free fluid. Bilateral tiny fat containing inguinal hernias. Far left abdominal wall excluded from the study. There has been prior left lateral abdominal wall hernia repair. Soft tissue thickening superficial to the sacrum on image 82. Nonspecific. No well-defined abscess or soft tissue gas to strongly suggest infection. Diffuse muscular atrophy.  Musculoskeletal: Osteopenia.  Lumbar spine fixation.  IMPRESSION: 1. Multifactorial degradation as detailed above. 2. Findings suspicious for mild uncomplicated pancreatitis. 3. Hepatosplenomegaly. 4. Pulmonary artery enlargement suggests pulmonary arterial hypertension. 5.  Atherosclerosis, including within the  coronary arteries. 6.  Possible constipation.   Electronically Signed   By: Jeronimo Greaves M.D.   On: 02/19/2014 15:19   Dg Abd Acute W/chest  02/19/2014   CLINICAL DATA:  Vomiting and cough for 4 days. Medial and inferior abdominal pain.  EXAM: ACUTE ABDOMEN SERIES (ABDOMEN 2 VIEW & CHEST 1 VIEW)  COMPARISON:  CT chest 07/21/2012.  FINDINGS: The heart is enlarged. The lung volumes are low. No focal airspace disease is evident.  Gas and stool are present throughout the colon. There is no evidence for obstruction. No free air is present. A left lateral hernia repair is noted. Postoperative changes are evident in the lumbar spine. The most inferior screw, at L4 on the left, is fractured  IMPRESSION: 1. Nonspecific bowel gas pattern without evidence for obstruction or free air. 2. Cardiomegaly without failure. 3. Low lung volumes. 4. Evidence of left lateral hernia repair. 5. Lumbar spine surgery with fractured pedicle screw.   Electronically Signed   By: Gennette Pac M.D.   On: 02/19/2014 12:01    CBC  Recent Labs Lab 02/19/14 1217 02/21/14 0408 02/22/14 0526  WBC 17.9* 13.3* 10.5  HGB 12.9* 11.5* 11.7*  HCT 40.1 36.9* 37.2*  PLT 164 156 176  MCV 90.9 94.9 93.7  MCH 29.3 29.6 29.5  MCHC 32.2 31.2 31.5  RDW 14.4 14.6 13.7  LYMPHSABS 1.6  --   --   MONOABS 1.2*  --   --   EOSABS 0.0  --   --   BASOSABS 0.0  --   --     Chemistries   Recent Labs Lab 02/19/14 1217 02/20/14 0425 02/21/14 0408 02/22/14 0526  NA 137 134* 132* 139  K 3.6* 3.4* 3.8 3.7  CL 90* 90* 89* 95*  CO2 36* 32 31 33*  GLUCOSE 113* 99 99 141*  BUN 9 9 22 15   CREATININE 0.49* 0.47* 1.50* 0.63  CALCIUM 9.7 9.2 8.7 8.9  AST 78*  --  25 21  ALT 244*  --  110* 81*  ALKPHOS 190*  --  128* 128*  BILITOT 0.8  --  0.4 0.4   ------------------------------------------------------------------------------------------------------------------ estimated creatinine clearance is 172.2 mL/min (by C-G formula based on Cr of  0.63). ------------------------------------------------------------------------------------------------------------------ No results for input(s): HGBA1C in the last 72 hours. ------------------------------------------------------------------------------------------------------------------  Recent Labs  02/21/14 0408  CHOL 163  HDL 28*  LDLCALC 108*  TRIG 137  CHOLHDL 5.8   ------------------------------------------------------------------------------------------------------------------ No results for input(s): TSH, T4TOTAL, T3FREE, THYROIDAB in the last 72 hours.  Invalid input(s): FREET3 ------------------------------------------------------------------------------------------------------------------ No results for input(s): VITAMINB12, FOLATE, FERRITIN, TIBC, IRON, RETICCTPCT in the last 72 hours.  Coagulation profile No results for input(s): INR, PROTIME in the last 168 hours.  No results for input(s): DDIMER in the last 72 hours.  Cardiac Enzymes  Recent Labs Lab 02/19/14 1217  TROPONINI <0.30   ------------------------------------------------------------------------------------------------------------------ Invalid input(s): POCBNP    Athen Riel D.O. on 02/23/2014 at 10:39 AM  Between 7am to 7pm - Pager - [phone]  After 7pm go to [email or web address] - password [email or web address]  And look for the night coverage person covering for me after hours  [facility] Office  [phone]

## 2014-02-23 NOTE — Plan of Care (Signed)
Problem: Phase I Progression Outcomes Goal: Pain controlled with appropriate interventions Outcome: Completed/Met Date Met:  02/23/14 Goal: Initial discharge plan identified Outcome: Completed/Met Date Met:  02/23/14  Problem: Phase II Progression Outcomes Goal: Discharge plan established Outcome: Completed/Met Date Met:  02/23/14

## 2014-02-23 NOTE — Progress Notes (Signed)
Pt refusing to turn for staff.

## 2014-02-23 NOTE — Progress Notes (Signed)
SATURATION QUALIFICATIONS: (This note is used to comply with regulatory documentation for home oxygen)  Patient Saturations on Room Air at Rest = 91-95%  Patient Saturations on Room Air while Ambulating = N/A- pt is paraplegic, non-ambulatory  Patient Saturations on  Liters of oxygen while Ambulating = %  Please briefly explain why patient needs home oxygen:

## 2014-02-24 LAB — COMPREHENSIVE METABOLIC PANEL
ALT: 55 U/L — AB (ref 0–53)
AST: 20 U/L (ref 0–37)
Albumin: 2.7 g/dL — ABNORMAL LOW (ref 3.5–5.2)
Alkaline Phosphatase: 109 U/L (ref 39–117)
Anion gap: 6 (ref 5–15)
BILIRUBIN TOTAL: 0.3 mg/dL (ref 0.3–1.2)
BUN: 5 mg/dL — ABNORMAL LOW (ref 6–23)
CHLORIDE: 98 meq/L (ref 96–112)
CO2: 33 meq/L — AB (ref 19–32)
Calcium: 8.7 mg/dL (ref 8.4–10.5)
Creatinine, Ser: 0.5 mg/dL (ref 0.50–1.35)
GFR calc non Af Amer: >90 mL/min (ref >90)
GLUCOSE: 112 mg/dL — AB (ref 70–99)
POTASSIUM: 3.6 meq/L — AB (ref 3.7–5.3)
Sodium: 137 mEq/L (ref 137–147)
TOTAL PROTEIN: 6.4 g/dL (ref 6.0–8.3)

## 2014-02-24 MED ORDER — BOOST / RESOURCE BREEZE PO LIQD: 1.0000 | Freq: Two times a day (BID) | ORAL | Status: DC

## 2014-02-24 MED ORDER — LEVOFLOXACIN 750 MG PO TABS: 750.0000 mg | ORAL_TABLET | Freq: Every day | ORAL | Status: DC

## 2014-02-24 MED ORDER — KETOROLAC TROMETHAMINE 30 MG/ML IJ SOLN
30.0000 mg | Freq: Once | INTRAMUSCULAR | Status: AC
  Administered 2014-02-24: 30 mg via INTRAVENOUS
  Filled 2014-02-24: qty 1

## 2014-02-24 MED ORDER — CARISOPRODOL 350 MG PO TABS
1050.0000 mg | ORAL_TABLET | Freq: Four times a day (QID) | ORAL | Status: DC
  Administered 2014-02-24: 1050 mg via ORAL
  Filled 2014-02-24: qty 3

## 2014-02-24 MED ORDER — SENNA 8.6 MG PO TABS: 1.0000 | ORAL_TABLET | Freq: Every day | ORAL | Status: DC

## 2014-02-24 MED ORDER — OXYCODONE HCL 5 MG PO TABS
5.0000 mg | ORAL_TABLET | Freq: Once | ORAL | Status: AC
  Administered 2014-02-24: 5 mg via ORAL
  Filled 2014-02-24: qty 1

## 2014-02-24 MED ORDER — POTASSIUM CHLORIDE CRYS ER 20 MEQ PO TBCR
40.0000 meq | EXTENDED_RELEASE_TABLET | Freq: Once | ORAL | Status: AC
  Administered 2014-02-24: 40 meq via ORAL
  Filled 2014-02-24: qty 2

## 2014-02-24 MED ORDER — LEVOFLOXACIN 750 MG PO TABS
750.0000 mg | ORAL_TABLET | Freq: Every day | ORAL | Status: DC
  Administered 2014-02-24: 750 mg via ORAL
  Filled 2014-02-24: qty 1

## 2014-02-24 MED ORDER — ONDANSETRON HCL 4 MG PO TABS: 4.0000 mg | ORAL_TABLET | Freq: Four times a day (QID) | ORAL | Status: DC | PRN

## 2014-02-24 NOTE — Progress Notes (Signed)
pts BP 174/61 NP on call paged. NP on call said to monitor pt and page her back if SBP is >180 or DBP is >100. Will continue to monitor.

## 2014-02-24 NOTE — Progress Notes (Signed)
CSW consulted for transportation needs. Patient will need non-emergency ambulance transport home. CSW confirmed home address with patient's wife, Grace Blightamela Heitz. [facility] called for transport. Transport pickup at 11:30 am.  No other CSW needs identified - CSW signing off.  Hampton AbbotKadijah Damyn Weitzel BSW Intern

## 2014-02-24 NOTE — Discharge Instructions (Signed)

## 2014-02-24 NOTE — Plan of Care (Signed)
Problem: Phase II Progression Outcomes Goal: IV changed to normal saline lock Outcome: Completed/Met Date Met:  02/24/14     

## 2014-02-24 NOTE — Discharge Summary (Addendum)
Physician Discharge Summary  [name] WUJ:[id number] DOB: [birth year] DOA: 02/19/2014  PCP: Shirlean MylarWEBB, CAROL, D, MD  Admit date: 02/19/2014 Discharge date: 02/24/2014  Time spent: 45 minutes  Recommendations for Outpatient Follow-up:  Patient will be discharged home. Patient will continue his medications as prescribed. Patient should follow-up with his primary care physician within one week of discharge. Patient should also have a repeat CBC as well as CMP within 1 week of discharge. Patient to continue a soft bland diet and advance to a carb modified diet as tolerated.  Discharge Diagnoses:  Acute mild pancreatitis Paraplegia following spinal cord injury Normocytic anemia Psychosis Hypokalemia Elevated liver function tests COPD Cough with suspected pneumonia Chronic back pain  Discharge Condition: Stable  Diet recommendation: Soft bland, advance to heart healthy as tolerated  Filed Weights   02/19/14 1933 02/20/14 1251  Weight: 180.7 kg (398 lb 5.9 oz) 181.5 kg (400 lb 2.2 oz)    History of present illness:  on 02/19/2014 63 year old with a history of paraplegia, history of COPD, hypertension, squama cell carcinoma of the skin, coronary artery disease presented for abdominal pain, nausea and vomiting. Patient was seen by his primary care physician 2 days before admission was asked to come to emergency department for abnormal labs. Patient was found to have leukocytosis and CT of the abdomen revealed noncompensated pancreatitis. Patient was referred to medical service for admission. Upon in admission, patient was found to have low potassium and elevated liver function tests. His lactic acid was normal. His urinary analysis was also abnormal and culture was sent. Patient was found to be hypoxic with oxygen saturations 88% upon arrival. Chest x-ray did not reveal pneumonia or heart failure. He was placed on 2 L of nasal cannula.  Hospital Course:  Acute mild  pancreatitis -Resolved -CT of the abdomen: mild pancreatitis -Patient was placed on soft bland diet and has tolerated well. -continue pain medication regimen, however patient does have history of opioid use with tolerance -ACE inhibitor was discontinued due to his pancreatitis, but may resume at discharge -Triglyceride level: 137, lipase 34 -Patient denies alcohol use  Paraplegia following spinal cord injury -Currently stable -Continue muscle relaxer  Normocytic anemia  -Hemoglobin appears to be stable, no active bleeding -Continue to monitor CBC  Leukocytosis -Likely secondary to urinary tract infection, urine culture showed Pseudomonas and MRSA -Due to multiple drug allergies, fosfomycin given -Patient was continued on Bactrim for MRSA for a total of 6 days  Hypokalemia -Replaced, patient will need to follow-up with his primary care physician within one week for repeat labs -Patient should continue his home supplementation dose of potassium  Elevated liver function tests -Improving -Patient should follow-up with his primary care physician  COPD -Appears stable and compensated -continue Symbicort, DuoNeb treatment  Cough with suspected pneumonia -Patient has had cough for more than 2 weeks. -Initial chest x-ray on admission showed no focal airspace disease -Repeat x-ray on 02/23/2014 showed mild left basilar atelectasis or infiltrate -Patient will be started on Levaquin 750 mg, daily for 5 days -Patient will need to follow-up with his primary care physician regarding this. -Spoke with patient as well as wife regarding his allergy to ciprofloxacin, was mild, patient had some itching.  Chronic back pain -Continue oxycodone, soma  Procedures: None  Consultations: None  Discharge Exam: Filed Vitals:   02/24/14 0456  BP: 150/62  Pulse: 58  Temp: 98.2 F (36.8 C)  Resp: 18     General: Well developed, well nourished, NAD, appears stated  age  HEENT: NCAT,   mucous membranes moist.  Cardiovascular: S1 S2 auscultated, no rubs, murmurs or gallops. Regular rate and rhythm.  Respiratory: Clear to auscultation bilaterally with equal chest rise  Abdomen: Soft, obese, nontender, nondistended, + bowel sounds  Extremities: warm dry without cyanosis clubbing   Neuro: AAOx3, no focal deficits, lower extremity paraplegia  Psych: Appropriate mood and affect  Discharge Instructions      Discharge Instructions    Discharge instructions    Complete by:  As directed   Patient will be discharged home. Patient will continue his medications as prescribed. Patient should follow-up with his primary care physician within one week of discharge. Patient should also have a repeat CBC as well as CMP within 1 week of discharge. Patient to continue a soft bland diet and advance to a carb modified diet as tolerated.            Medication List    STOP taking these medications        guaiFENesin-dextromethorphan 100-10 MG/5ML syrup  Commonly known as:  ROBITUSSIN DM     metoprolol tartrate 12.5 mg Tabs tablet  Commonly known as:  LOPRESSOR     multivitamin with minerals Tabs tablet     sodium chloride 0.9 % SOLN 500 mL with vancomycin 1000 MG SOLR 1,500 mg      TAKE these medications        acetaminophen-codeine 300-60 MG per tablet  Commonly known as:  TYLENOL #4  Take 3 tablets by mouth every 6 (six) hours as needed for moderate pain.     albuterol 108 (90 BASE) MCG/ACT inhaler  Commonly known as:  PROVENTIL HFA;VENTOLIN HFA  Inhale 2 puffs into the lungs every 6 (six) hours as needed. For shortness of breath     ALIGN 4 MG Caps  Take 1 capsule by mouth daily.     anti-nausea solution  Take 10 mLs by mouth every 15 (fifteen) minutes as needed for nausea or vomiting.     aspirin 325 MG tablet  Take 325 mg by mouth daily.     benazepril 40 MG tablet  Commonly known as:  LOTENSIN  Take 40 mg by mouth daily.     benazepril 40 MG tablet   Commonly known as:  LOTENSIN  Take 1 tablet (40 mg total) by mouth daily.     budesonide-formoterol 160-4.5 MCG/ACT inhaler  Commonly known as:  SYMBICORT  Inhale 2 puffs into the lungs 2 (two) times daily.     carisoprodol 350 MG tablet  Commonly known as:  SOMA  Take 1,050 mg by mouth every 6 (six) hours.     carvedilol 6.25 MG tablet  Commonly known as:  COREG  Take 6.25 mg by mouth every morning.     cetirizine 10 MG tablet  Commonly known as:  ZYRTEC  Take 10 mg by mouth daily.     diazepam 10 MG tablet  Commonly known as:  VALIUM  Take 10 mg by mouth at bedtime as needed for anxiety.     feeding supplement (RESOURCE BREEZE) Liqd  Take 1 Container by mouth 2 (two) times daily between meals.     ferrous sulfate 325 (65 FE) MG tablet  Take 975 mg by mouth daily.     fluticasone 50 MCG/ACT nasal spray  Commonly known as:  FLONASE  Place 1 spray into both nostrils daily.     furosemide 20 MG tablet  Commonly known as:  LASIX  Take 40  mg by mouth 2 (two) times daily as needed.     guaiFENesin 600 MG 12 hr tablet  Commonly known as:  MUCINEX  Take 600 mg by mouth daily.     lactobacillus Pack  Take 1 packet (1 g total) by mouth 3 (three) times daily with meals.     levofloxacin 750 MG tablet  Commonly known as:  LEVAQUIN  Take 1 tablet (750 mg total) by mouth daily.     multivitamin Liqd  Take 5 mLs by mouth daily.     nitroGLYCERIN 0.4 MG SL tablet  Commonly known as:  NITROSTAT  Place 0.4 mg under the tongue every 5 (five) minutes as needed for chest pain.     OMEGA 3 PO  Take 1 capsule by mouth daily.     ondansetron 4 MG tablet  Commonly known as:  ZOFRAN  Take 1 tablet (4 mg total) by mouth every 6 (six) hours as needed for nausea.     oxyCODONE-acetaminophen 10-325 MG per tablet  Commonly known as:  PERCOCET  Take 1 tablet by mouth at bedtime.     Oxycodone-Aspirin 1.6109-604 MG Tabs  Take 1 tablet by mouth 3 (three) times daily as needed (for  pain).     potassium chloride SA 20 MEQ tablet  Commonly known as:  K-DUR,KLOR-CON  Take 20 mEq by mouth daily.     prochlorperazine 10 MG tablet  Commonly known as:  COMPAZINE  Take 10 mg by mouth every 6 (six) hours as needed for nausea.     senna 8.6 MG Tabs tablet  Commonly known as:  SENOKOT  Take 1 tablet (8.6 mg total) by mouth daily.     tiotropium 18 MCG inhalation capsule  Commonly known as:  SPIRIVA  Place 18 mcg into inhaler and inhale daily.       Allergies  Allergen Reactions  . Ciprofloxacin     Unknown-Noted on list  . Clonidine Derivatives     Unknown-Noted on list  . Darvocet [Propoxyphene N-Acetaminophen]     Unknown-Noted on list  . Decadron [Dexamethasone]     Unknown-Noted on list  . Lipitor [Atorvastatin Calcium]     Unknown-Noted on list  . Lisinopril     Unknown-Noted on list  . Methylprednisolone     Unknown-Noted on list  . Norvasc [Amlodipine Besylate]     Unknown-Noted on list  . Prednisolone     Unknown-Noted on list  . Sectral [Acebutolol Hcl]     Unknown-Noted on list  . Suprax [Cefixime]     Unknown-Noted on list   Follow-up Information    Follow up with WEBB, CAROL, D, MD. Schedule an appointment as soon as possible for a visit in 1 week.   Specialty:  Family Medicine   Why:  Hospital followup, Repeat CBC and CMP   Contact information:   [address] [place] [address] [phone]        The results of significant diagnostics from this hospitalization (including imaging, microbiology, ancillary and laboratory) are listed below for reference.    Significant Diagnostic Studies: Dg Chest 2 View  02/23/2014   CLINICAL DATA:  Weakness, shortness of breath, hypoxia  EXAM: CHEST  2 VIEW  COMPARISON:  02/19/2014  FINDINGS: Cardiomediastinal silhouette is stable. Study is limited by poor inspiration. Elevation of the right hemidiaphragm again noted. Mild left basilar atelectasis or infiltrate. No pulmonary  edema.  IMPRESSION: Limited study by poor inspiration. Elevation of the right  hemidiaphragm again noted. Mild left basilar atelectasis or infiltrate. No pulmonary edema   Electronically Signed   By: Natasha Mead M.D.   On: 02/23/2014 12:11   Ct Abdomen Pelvis W Contrast  02/19/2014   CLINICAL DATA:  Nausea and vomiting x 2 days with elevated WBC of unknown etiology. Nurse note: Pt is a paraplegic since 2009. "Infarct in spine" Pt had abnormal labs drawn on Thursday. Elevated WBC and elevated LFT. Pancreatitis with UTI. Pt has been n/v since Thursday morning.  EXAM: CT ABDOMEN AND PELVIS WITH CONTRAST  TECHNIQUE: Multidetector CT imaging of the abdomen and pelvis was performed using the standard protocol following bolus administration of intravenous contrast.  CONTRAST:  50mL OMNIPAQUE IOHEXOL 300 MG/ML SOLN, OMNIPAQUE IOHEXOL 300 MG/ML SOLN  COMPARISON:  06/13/2011  FINDINGS: Lower chest: Bibasilar subsegmental atelectasis. Mild cardiomegaly with LAD coronary artery atherosclerosis. No pericardial or pleural effusion. Pulmonary artery enlargement, with the outflow tract measuring 4.1 cm.  Hepatobiliary: Marked hepatomegaly. Mild degradation secondary to patient body habitus and beam hardening artifact from hardware within the lumbar spine. Cholecystectomy, without biliary ductal dilatation.  Pancreas: No pancreatic ductal dilatation. Mild peripancreatic edema suspected, including on image 36 of series 2.  Spleen: Splenomegaly, 17.5 cm craniocaudal.  Adrenals/Urinary Tract: Normal adrenal glands. Right renal interpolar dominant 9.4 cm cyst. Too small to characterize lesions in both kidneys. No hydronephrosis. Decompressed urinary bladder around a Foley catheter.  Stomach/Bowel: Normal stomach, without wall thickening. Colonic stool burden suggests constipation. Normal terminal ileum and appendix. Normal small bowel without abdominal ascites.  Vascular/Lymphatic: Aortic and branch vessel atherosclerosis. No  abdominopelvic adenopathy.  Reproductive: Normal prostate.  Other: No significant free fluid. Bilateral tiny fat containing inguinal hernias. Far left abdominal wall excluded from the study. There has been prior left lateral abdominal wall hernia repair. Soft tissue thickening superficial to the sacrum on image 82. Nonspecific. No well-defined abscess or soft tissue gas to strongly suggest infection. Diffuse muscular atrophy.  Musculoskeletal: Osteopenia.  Lumbar spine fixation.  IMPRESSION: 1. Multifactorial degradation as detailed above. 2. Findings suspicious for mild uncomplicated pancreatitis. 3. Hepatosplenomegaly. 4. Pulmonary artery enlargement suggests pulmonary arterial hypertension. 5.  Atherosclerosis, including within the coronary arteries. 6.  Possible constipation.   Electronically Signed   By: Jeronimo Greaves M.D.   On: 02/19/2014 15:19   Dg Abd Acute W/chest  02/19/2014   CLINICAL DATA:  Vomiting and cough for 4 days. Medial and inferior abdominal pain.  EXAM: ACUTE ABDOMEN SERIES (ABDOMEN 2 VIEW & CHEST 1 VIEW)  COMPARISON:  CT chest 07/21/2012.  FINDINGS: The heart is enlarged. The lung volumes are low. No focal airspace disease is evident.  Gas and stool are present throughout the colon. There is no evidence for obstruction. No free air is present. A left lateral hernia repair is noted. Postoperative changes are evident in the lumbar spine. The most inferior screw, at L4 on the left, is fractured  IMPRESSION: 1. Nonspecific bowel gas pattern without evidence for obstruction or free air. 2. Cardiomegaly without failure. 3. Low lung volumes. 4. Evidence of left lateral hernia repair. 5. Lumbar spine surgery with fractured pedicle screw.   Electronically Signed   By: Gennette Pac M.D.   On: 02/19/2014 12:01    Microbiology: Recent Results (from the past 240 hour(s))  Urine culture     Status: None   Collection Time: 02/19/14 12:09 PM  Result Value Ref Range Status   Specimen Description  URINE, CATHETERIZED  Final   Special  Requests NONE  Final   Culture  Setup Time   Final    02/19/2014 20:49 Performed at [facility]    Colony Count   Final    50,000 COLONIES/ML Performed at [facility]    Culture   Final    PSEUDOMONAS AERUGINOSA METHICILLIN RESISTANT STAPHYLOCOCCUS AUREUS Note: RIFAMPIN AND GENTAMICIN SHOULD NOT BE USED AS SINGLE DRUGS FOR TREATMENT OF STAPH INFECTIONS. CRITICAL RESULT CALLED TO, READ BACK BY AND VERIFIED WITH: JESSICA 11/17 AT 1310 BY DUNNJ Performed at [facility]    Report Status 02/22/2014 FINAL  Final   Organism ID, Bacteria PSEUDOMONAS AERUGINOSA  Final   Organism ID, Bacteria METHICILLIN RESISTANT STAPHYLOCOCCUS AUREUS  Final      Susceptibility   Pseudomonas aeruginosa - MIC*    CEFEPIME 4 SENSITIVE Sensitive     CEFTAZIDIME 4 SENSITIVE Sensitive     CIPROFLOXACIN 0.5 SENSITIVE Sensitive     GENTAMICIN <=1 SENSITIVE Sensitive     IMIPENEM 1 SENSITIVE Sensitive     PIP/TAZO 32 SENSITIVE Sensitive     TOBRAMYCIN* <=1 SENSITIVE Sensitive      * SET UP TIME:  [id number]    * PSEUDOMONAS AERUGINOSA   Methicillin resistant staphylococcus aureus - MIC*    GENTAMICIN <=0.5 SENSITIVE Sensitive     LEVOFLOXACIN >=8 RESISTANT Resistant     NITROFURANTOIN <=16 SENSITIVE Sensitive     OXACILLIN >=4 RESISTANT Resistant     PENICILLIN >=0.5 RESISTANT Resistant     RIFAMPIN <=0.5 SENSITIVE Sensitive     TRIMETH/SULFA <=10 SENSITIVE Sensitive     VANCOMYCIN 1 SENSITIVE Sensitive     TETRACYCLINE <=1 SENSITIVE Sensitive     * METHICILLIN RESISTANT STAPHYLOCOCCUS AUREUS     Labs: Basic Metabolic Panel:  Recent Labs Lab 02/19/14 1217 02/20/14 0425 02/21/14 0408 02/22/14 0526 02/24/14 0450  NA 137 134* 132* 139 137  K 3.6* 3.4* 3.8 3.7 3.6*  CL 90* 90* 89* 95* 98  CO2 36* 32 31 33* 33*  GLUCOSE 113* 99 99 141* 112*  BUN 9 9 22 15  5*  CREATININE 0.49* 0.47* 1.50* 0.63 0.50  CALCIUM 9.7 9.2 8.7 8.9 8.7    Liver Function Tests:  Recent Labs Lab 02/19/14 1217 02/21/14 0408 02/22/14 0526 02/24/14 0450  AST 78* 25 21 20   ALT 244* 110* 81* 55*  ALKPHOS 190* 128* 128* 109  BILITOT 0.8 0.4 0.4 0.3  PROT 7.6 6.8 7.1 6.4  ALBUMIN 3.3* 2.7* 2.9* 2.7*    Recent Labs Lab 02/19/14 1217 02/21/14 0408  LIPASE 54 34   No results for input(s): AMMONIA in the last 168 hours. CBC:  Recent Labs Lab 02/19/14 1217 02/21/14 0408 02/22/14 0526  WBC 17.9* 13.3* 10.5  NEUTROABS 15.1*  --   --   HGB 12.9* 11.5* 11.7*  HCT 40.1 36.9* 37.2*  MCV 90.9 94.9 93.7  PLT 164 156 176   Cardiac Enzymes:  Recent Labs Lab 02/19/14 1217  TROPONINI <0.30   BNP: BNP (last 3 results)  Recent Labs  02/19/14 1219  PROBNP 333.9*   CBG: No results for input(s): GLUCAP in the last 168 hours.     SignedEdsel Petrin:  Sanaiya Welliver  [facility] 02/24/2014, 10:27 AM

## 2014-02-24 NOTE — Plan of Care (Signed)
Problem: Phase II Progression Outcomes Goal: Obtain order to discontinue catheter if appropriate Outcome: Not Applicable Date Met:  02/24/14     

## 2014-04-08 DIAGNOSIS — I4891 Unspecified atrial fibrillation: Secondary | ICD-10-CM

## 2014-04-08 HISTORY — DX: Unspecified atrial fibrillation: I48.91

## 2014-10-27 ENCOUNTER — Other Ambulatory Visit (HOSPITAL_COMMUNITY): Payer: Self-pay | Admitting: Family Medicine

## 2014-10-27 DIAGNOSIS — R0602 Shortness of breath: Secondary | ICD-10-CM

## 2014-11-02 ENCOUNTER — Other Ambulatory Visit (HOSPITAL_COMMUNITY): Payer: Commercial Managed Care - HMO

## 2014-11-02 ENCOUNTER — Ambulatory Visit (HOSPITAL_COMMUNITY)
Admission: RE | Admit: 2014-11-02 | Discharge: 2014-11-02 | Disposition: A | Discharge status: Home / Self Care | Payer: Commercial Managed Care - HMO | Source: Ambulatory Visit | Attending: Family Medicine | Admitting: Family Medicine

## 2014-11-02 DIAGNOSIS — I351 Nonrheumatic aortic (valve) insufficiency: Secondary | ICD-10-CM | POA: Insufficient documentation

## 2014-11-02 DIAGNOSIS — I10 Essential (primary) hypertension: Secondary | ICD-10-CM | POA: Diagnosis not present

## 2014-11-02 DIAGNOSIS — R0602 Shortness of breath: Secondary | ICD-10-CM

## 2014-11-02 DIAGNOSIS — R0600 Dyspnea, unspecified: Secondary | ICD-10-CM | POA: Diagnosis present

## 2014-11-02 NOTE — Progress Notes (Signed)
  Echocardiogram 2D Echocardiogram has been performed.Definity may have been beneficial.  Called endo everyone had left for the day, then called [facility] and was told to call rapid response and IV team. When I called rapid response they said they were unavailable.   Tye Savoy 11/02/2014, 3:55 PM

## 2014-11-08 ENCOUNTER — Other Ambulatory Visit: Payer: Self-pay | Admitting: Family Medicine

## 2014-11-08 DIAGNOSIS — R0600 Dyspnea, unspecified: Secondary | ICD-10-CM

## 2014-11-09 ENCOUNTER — Ambulatory Visit (INDEPENDENT_AMBULATORY_CARE_PROVIDER_SITE_OTHER): Payer: Commercial Managed Care - HMO | Admitting: Pulmonary Disease

## 2014-11-09 ENCOUNTER — Encounter: Payer: Self-pay | Admitting: Pulmonary Disease

## 2014-11-09 VITALS — BP 132/84 | HR 58 | Temp 97.9°F | Ht 79.0 in | Wt 375.0 lb

## 2014-11-09 DIAGNOSIS — G894 Chronic pain syndrome: Secondary | ICD-10-CM

## 2014-11-09 DIAGNOSIS — R609 Edema, unspecified: Secondary | ICD-10-CM

## 2014-11-09 DIAGNOSIS — R0600 Dyspnea, unspecified: Secondary | ICD-10-CM

## 2014-11-09 DIAGNOSIS — B370 Candidal stomatitis: Secondary | ICD-10-CM

## 2014-11-09 DIAGNOSIS — I872 Venous insufficiency (chronic) (peripheral): Secondary | ICD-10-CM

## 2014-11-09 DIAGNOSIS — G8220 Paraplegia, unspecified: Secondary | ICD-10-CM | POA: Diagnosis not present

## 2014-11-09 DIAGNOSIS — J4530 Mild persistent asthma, uncomplicated: Secondary | ICD-10-CM

## 2014-11-09 DIAGNOSIS — G8929 Other chronic pain: Secondary | ICD-10-CM | POA: Insufficient documentation

## 2014-11-09 DIAGNOSIS — J984 Other disorders of lung: Secondary | ICD-10-CM | POA: Diagnosis not present

## 2014-11-09 DIAGNOSIS — G4701 Insomnia due to medical condition: Secondary | ICD-10-CM

## 2014-11-09 DIAGNOSIS — N319 Neuromuscular dysfunction of bladder, unspecified: Secondary | ICD-10-CM

## 2014-11-09 DIAGNOSIS — I5032 Chronic diastolic (congestive) heart failure: Secondary | ICD-10-CM

## 2014-11-09 DIAGNOSIS — I2510 Atherosclerotic heart disease of native coronary artery without angina pectoris: Secondary | ICD-10-CM

## 2014-11-09 DIAGNOSIS — I10 Essential (primary) hypertension: Secondary | ICD-10-CM

## 2014-11-09 LAB — PULMONARY FUNCTION TEST
DL/VA % PRED: 117 %
DL/VA: 5.92 ml/min/mmHg/L
DLCO UNC % PRED: 66 %
DLCO UNC: 29.5 ml/min/mmHg
FEF 25-75 Post: 1.27 L/sec
FEF 25-75 Pre: 1.41 L/sec
FEF2575-%Change-Post: -9 %
FEF2575-%PRED-PRE: 38 %
FEF2575-%Pred-Post: 34 %
FEV1-%CHANGE-POST: -2 %
FEV1-%PRED-POST: 44 %
FEV1-%PRED-PRE: 45 %
FEV1-POST: 2.06 L
FEV1-PRE: 2.12 L
FEV1FVC-%Change-Post: 5 %
FEV1FVC-%PRED-PRE: 96 %
FEV6-%CHANGE-POST: -6 %
FEV6-%Pred-Post: 45 %
FEV6-%Pred-Pre: 48 %
FEV6-Post: 2.71 L
FEV6-Pre: 2.89 L
FEV6FVC-%Change-Post: 1 %
FEV6FVC-%PRED-POST: 104 %
FEV6FVC-%Pred-Pre: 103 %
FVC-%Change-Post: -7 %
FVC-%Pred-Post: 43 %
FVC-%Pred-Pre: 46 %
FVC-Post: 2.71 L
FVC-Pre: 2.92 L
POST FEV1/FVC RATIO: 76 %
PRE FEV6/FVC RATIO: 99 %
Post FEV6/FVC ratio: 100 %
Pre FEV1/FVC ratio: 72 %

## 2014-11-09 MED ORDER — FLUCONAZOLE 100 MG PO TABS: ORAL_TABLET | ORAL | Status: DC

## 2014-11-09 MED ORDER — ONE FLOW SPIROMETER DEVI: Status: DC

## 2014-11-09 MED ORDER — FLUTTER DEVI: Status: DC

## 2014-11-09 NOTE — Progress Notes (Signed)
PFT done today. 

## 2014-11-09 NOTE — Patient Instructions (Signed)
[place]- it was nice meeting you today...  As we discussed> it is imperative that you lose weight & fluid...    Count calories and eat less, plus absolutely no salt/sodium...  Today we did a follow up CXR... We will sched a home sleep test for you...    We will contact you w/ the results when available...   In the meanwhile>    We are treating your Thrush w/ DIFLUCAN- take 2 tabs today, then one tab daily til gone...    OK to start slowly decreasing the Symbicort from 2 sprays twice daily to 2 sprays once daily    And continue the Spiriva once daily as well...    INCREASE the MUCINEX to  twice daily 7 lots of water by mouth...    Use the INCENTIVE SPIROMETER frequently to expand your lungs better (use it as a lung exerciser)...    And use the FLUTTER VALVE 4 times daily to aide in mucus production & to clear your lungs/ throat/ etc...  Call for any questions.Marland KitchenMarland Kitchen

## 2014-11-14 DIAGNOSIS — I872 Venous insufficiency (chronic) (peripheral): Secondary | ICD-10-CM | POA: Insufficient documentation

## 2014-11-14 DIAGNOSIS — I2510 Atherosclerotic heart disease of native coronary artery without angina pectoris: Secondary | ICD-10-CM | POA: Insufficient documentation

## 2014-11-14 DIAGNOSIS — R609 Edema, unspecified: Secondary | ICD-10-CM | POA: Insufficient documentation

## 2014-11-14 DIAGNOSIS — B370 Candidal stomatitis: Secondary | ICD-10-CM | POA: Insufficient documentation

## 2014-11-14 DIAGNOSIS — N319 Neuromuscular dysfunction of bladder, unspecified: Secondary | ICD-10-CM | POA: Insufficient documentation

## 2014-11-14 DIAGNOSIS — I10 Essential (primary) hypertension: Secondary | ICD-10-CM | POA: Insufficient documentation

## 2014-11-14 DIAGNOSIS — I5032 Chronic diastolic (congestive) heart failure: Secondary | ICD-10-CM | POA: Insufficient documentation

## 2014-11-14 DIAGNOSIS — G894 Chronic pain syndrome: Secondary | ICD-10-CM | POA: Insufficient documentation

## 2014-11-14 DIAGNOSIS — J45909 Unspecified asthma, uncomplicated: Secondary | ICD-10-CM | POA: Insufficient documentation
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
## 2014-11-17 ENCOUNTER — Telehealth: Payer: Self-pay | Admitting: Pulmonary Disease

## 2014-11-17 NOTE — Telephone Encounter (Signed)
Left message for [name] with [facility] to call back to discuss home sleep test

## 2014-11-17 NOTE — Telephone Encounter (Signed)
Spoke with [facility], aware that order was placed for HST from our office but does not look to have been taken care of yet.  Will send to [facility] to address order. Please advise. Thanks.

## 2014-11-17 NOTE — Telephone Encounter (Signed)
There is a few people in front of him he should be getting a call next week to get set up [name]

## 2014-11-17 NOTE — Telephone Encounter (Signed)
Spoke with Aundra Millet at [facility] to make aware.  Aundra Millet states she will make pt aware.  Nothing further needed at this time.

## 2014-11-20 DIAGNOSIS — G4733 Obstructive sleep apnea (adult) (pediatric): Secondary | ICD-10-CM | POA: Diagnosis not present

## 2014-11-22 DIAGNOSIS — G4733 Obstructive sleep apnea (adult) (pediatric): Secondary | ICD-10-CM | POA: Diagnosis not present

## 2014-11-23 ENCOUNTER — Encounter: Payer: Self-pay | Admitting: Pulmonary Disease

## 2014-11-23 ENCOUNTER — Other Ambulatory Visit: Payer: Self-pay | Admitting: *Deleted

## 2014-11-23 DIAGNOSIS — J984 Other disorders of lung: Secondary | ICD-10-CM

## 2014-11-23 DIAGNOSIS — G8220 Paraplegia, unspecified: Secondary | ICD-10-CM

## 2014-11-29 ENCOUNTER — Other Ambulatory Visit: Payer: Self-pay | Admitting: Pulmonary Disease

## 2014-11-29 DIAGNOSIS — G4733 Obstructive sleep apnea (adult) (pediatric): Secondary | ICD-10-CM

## 2015-01-12 ENCOUNTER — Telehealth: Payer: Self-pay | Admitting: Pulmonary Disease

## 2015-01-12 NOTE — Telephone Encounter (Signed)
Called and spoke with pt's wife [name]. She stated that she received a letter from [facility] that his CPAP was denied. She states the letter says [employer] needs scoring criteria from sleep study done on 11/20/14. Order for CPAP was placed by SN on 11/29/14 to [facility]. Pt's wife stated the urgent appeal fax number in the letter was [phone].   I called [facility] Customer Service at [phone]. When speaking to [facility] they stated they did not see a claim for the CPAP on or around the date of 11/29/14 and to discuss this with [facility].  I LVM forMelissa at [facility] to explain to her the situation and to find out if they have filed the claim for the CPAP and verify sleep study was sent to [facility]. Will await call back from [facility]

## 2015-03-13 ENCOUNTER — Encounter: Payer: Self-pay | Admitting: Pulmonary Disease

## 2015-03-14 ENCOUNTER — Encounter: Payer: Self-pay | Admitting: Pulmonary Disease

## 2015-05-29 ENCOUNTER — Encounter (HOSPITAL_BASED_OUTPATIENT_CLINIC_OR_DEPARTMENT_OTHER): Payer: Managed Care, Other (non HMO) | Attending: Internal Medicine

## 2015-05-29 DIAGNOSIS — Z6841 Body Mass Index (BMI) 40.0 and over, adult: Secondary | ICD-10-CM | POA: Insufficient documentation

## 2015-05-29 DIAGNOSIS — G8220 Paraplegia, unspecified: Secondary | ICD-10-CM | POA: Diagnosis not present

## 2015-05-29 DIAGNOSIS — L97223 Non-pressure chronic ulcer of left calf with necrosis of muscle: Secondary | ICD-10-CM | POA: Insufficient documentation

## 2015-05-29 DIAGNOSIS — I890 Lymphedema, not elsewhere classified: Secondary | ICD-10-CM | POA: Insufficient documentation

## 2015-05-30 ENCOUNTER — Other Ambulatory Visit: Payer: Self-pay | Admitting: Internal Medicine

## 2015-06-01 ENCOUNTER — Encounter (HOSPITAL_COMMUNITY): Payer: Managed Care, Other (non HMO)

## 2015-06-08 ENCOUNTER — Other Ambulatory Visit: Payer: Self-pay | Admitting: Internal Medicine

## 2015-06-08 DIAGNOSIS — L97223 Non-pressure chronic ulcer of left calf with necrosis of muscle: Secondary | ICD-10-CM

## 2015-06-08 DIAGNOSIS — I890 Lymphedema, not elsewhere classified: Secondary | ICD-10-CM

## 2015-06-14 ENCOUNTER — Ambulatory Visit (HOSPITAL_COMMUNITY): Payer: Managed Care, Other (non HMO)

## 2015-06-15 ENCOUNTER — Ambulatory Visit (HOSPITAL_COMMUNITY): Payer: Managed Care, Other (non HMO)

## 2015-06-16 ENCOUNTER — Other Ambulatory Visit: Payer: Self-pay | Admitting: Internal Medicine

## 2015-06-16 ENCOUNTER — Ambulatory Visit (HOSPITAL_COMMUNITY)
Admission: RE | Admit: 2015-06-16 | Discharge: 2015-06-16 | Disposition: A | Discharge status: Home / Self Care | Payer: Managed Care, Other (non HMO) | Source: Ambulatory Visit | Attending: Vascular Surgery | Admitting: Vascular Surgery

## 2015-06-16 DIAGNOSIS — L97929 Non-pressure chronic ulcer of unspecified part of left lower leg with unspecified severity: Secondary | ICD-10-CM | POA: Diagnosis present

## 2015-06-16 DIAGNOSIS — I2510 Atherosclerotic heart disease of native coronary artery without angina pectoris: Secondary | ICD-10-CM | POA: Diagnosis not present

## 2015-06-16 DIAGNOSIS — I10 Essential (primary) hypertension: Secondary | ICD-10-CM | POA: Diagnosis not present

## 2015-06-21 ENCOUNTER — Ambulatory Visit (HOSPITAL_COMMUNITY): Payer: Managed Care, Other (non HMO)

## 2015-09-22 ENCOUNTER — Encounter (HOSPITAL_BASED_OUTPATIENT_CLINIC_OR_DEPARTMENT_OTHER): Payer: Managed Care, Other (non HMO) | Attending: Internal Medicine

## 2015-09-22 DIAGNOSIS — Z85828 Personal history of other malignant neoplasm of skin: Secondary | ICD-10-CM | POA: Insufficient documentation

## 2015-09-22 DIAGNOSIS — S31000A Unspecified open wound of lower back and pelvis without penetration into retroperitoneum, initial encounter: Secondary | ICD-10-CM | POA: Insufficient documentation

## 2015-09-22 DIAGNOSIS — I87332 Chronic venous hypertension (idiopathic) with ulcer and inflammation of left lower extremity: Secondary | ICD-10-CM | POA: Insufficient documentation

## 2015-09-22 DIAGNOSIS — L97223 Non-pressure chronic ulcer of left calf with necrosis of muscle: Secondary | ICD-10-CM | POA: Insufficient documentation

## 2015-09-22 DIAGNOSIS — L97821 Non-pressure chronic ulcer of other part of left lower leg limited to breakdown of skin: Secondary | ICD-10-CM | POA: Insufficient documentation

## 2015-09-22 DIAGNOSIS — I890 Lymphedema, not elsewhere classified: Secondary | ICD-10-CM | POA: Insufficient documentation

## 2015-09-22 DIAGNOSIS — X58XXXA Exposure to other specified factors, initial encounter: Secondary | ICD-10-CM | POA: Insufficient documentation

## 2015-09-29 DIAGNOSIS — X58XXXA Exposure to other specified factors, initial encounter: Secondary | ICD-10-CM | POA: Diagnosis not present

## 2015-09-29 DIAGNOSIS — Z85828 Personal history of other malignant neoplasm of skin: Secondary | ICD-10-CM | POA: Diagnosis not present

## 2015-09-29 DIAGNOSIS — S31000A Unspecified open wound of lower back and pelvis without penetration into retroperitoneum, initial encounter: Secondary | ICD-10-CM | POA: Diagnosis not present

## 2015-09-29 DIAGNOSIS — L97821 Non-pressure chronic ulcer of other part of left lower leg limited to breakdown of skin: Secondary | ICD-10-CM | POA: Diagnosis not present

## 2015-09-29 DIAGNOSIS — L97223 Non-pressure chronic ulcer of left calf with necrosis of muscle: Secondary | ICD-10-CM | POA: Diagnosis not present

## 2015-09-29 DIAGNOSIS — I87332 Chronic venous hypertension (idiopathic) with ulcer and inflammation of left lower extremity: Secondary | ICD-10-CM | POA: Diagnosis not present

## 2015-09-29 DIAGNOSIS — I890 Lymphedema, not elsewhere classified: Secondary | ICD-10-CM | POA: Diagnosis not present

## 2015-10-09 ENCOUNTER — Other Ambulatory Visit (HOSPITAL_COMMUNITY): Payer: Self-pay | Admitting: Urology

## 2015-10-09 DIAGNOSIS — N281 Cyst of kidney, acquired: Secondary | ICD-10-CM

## 2015-10-25 ENCOUNTER — Ambulatory Visit (HOSPITAL_COMMUNITY): Payer: Managed Care, Other (non HMO)

## 2016-06-06 ENCOUNTER — Other Ambulatory Visit: Payer: Self-pay | Admitting: Family Medicine

## 2016-06-06 DIAGNOSIS — R9389 Abnormal findings on diagnostic imaging of other specified body structures: Secondary | ICD-10-CM

## 2016-06-13 ENCOUNTER — Ambulatory Visit
Admission: RE | Admit: 2016-06-13 | Discharge: 2016-06-13 | Disposition: A | Discharge status: Home / Self Care | Payer: 59 | Source: Ambulatory Visit | Attending: Family Medicine | Admitting: Family Medicine

## 2016-06-13 DIAGNOSIS — R9389 Abnormal findings on diagnostic imaging of other specified body structures: Secondary | ICD-10-CM

## 2016-06-13 MED ORDER — IOPAMIDOL (ISOVUE-300) INJECTION 61%
75.0000 mL | Freq: Once | INTRAVENOUS | Status: AC | PRN
  Administered 2016-06-13: 75 mL via INTRAVENOUS

## 2016-09-05 ENCOUNTER — Inpatient Hospital Stay (HOSPITAL_COMMUNITY)
Admission: EM | Admit: 2016-09-05 | Discharge: 2016-10-06 | DRG: 166 | Disposition: E | Payer: 59 | Attending: Family Medicine | Admitting: Family Medicine

## 2016-09-05 ENCOUNTER — Encounter (HOSPITAL_COMMUNITY): Payer: Self-pay | Admitting: *Deleted

## 2016-09-05 ENCOUNTER — Emergency Department (HOSPITAL_COMMUNITY): Payer: 59

## 2016-09-05 DIAGNOSIS — L8990 Pressure ulcer of unspecified site, unspecified stage: Secondary | ICD-10-CM | POA: Insufficient documentation

## 2016-09-05 DIAGNOSIS — Z9119 Patient's noncompliance with other medical treatment and regimen: Secondary | ICD-10-CM | POA: Diagnosis not present

## 2016-09-05 DIAGNOSIS — I959 Hypotension, unspecified: Secondary | ICD-10-CM | POA: Diagnosis not present

## 2016-09-05 DIAGNOSIS — I482 Chronic atrial fibrillation, unspecified: Secondary | ICD-10-CM | POA: Diagnosis present

## 2016-09-05 DIAGNOSIS — S24103S Unspecified injury at T7-T10 level of thoracic spinal cord, sequela: Secondary | ICD-10-CM

## 2016-09-05 DIAGNOSIS — Z6841 Body Mass Index (BMI) 40.0 and over, adult: Secondary | ICD-10-CM | POA: Diagnosis not present

## 2016-09-05 DIAGNOSIS — J9611 Chronic respiratory failure with hypoxia: Secondary | ICD-10-CM

## 2016-09-05 DIAGNOSIS — F40240 Claustrophobia: Secondary | ICD-10-CM | POA: Diagnosis present

## 2016-09-05 DIAGNOSIS — N390 Urinary tract infection, site not specified: Secondary | ICD-10-CM | POA: Diagnosis present

## 2016-09-05 DIAGNOSIS — D649 Anemia, unspecified: Secondary | ICD-10-CM | POA: Diagnosis not present

## 2016-09-05 DIAGNOSIS — R578 Other shock: Secondary | ICD-10-CM | POA: Diagnosis present

## 2016-09-05 DIAGNOSIS — I509 Heart failure, unspecified: Secondary | ICD-10-CM | POA: Diagnosis not present

## 2016-09-05 DIAGNOSIS — J9811 Atelectasis: Secondary | ICD-10-CM

## 2016-09-05 DIAGNOSIS — Z01818 Encounter for other preprocedural examination: Secondary | ICD-10-CM

## 2016-09-05 DIAGNOSIS — J984 Other disorders of lung: Secondary | ICD-10-CM

## 2016-09-05 DIAGNOSIS — Z7901 Long term (current) use of anticoagulants: Secondary | ICD-10-CM

## 2016-09-05 DIAGNOSIS — Z888 Allergy status to other drugs, medicaments and biological substances status: Secondary | ICD-10-CM

## 2016-09-05 DIAGNOSIS — I10 Essential (primary) hypertension: Secondary | ICD-10-CM | POA: Diagnosis not present

## 2016-09-05 DIAGNOSIS — Z9049 Acquired absence of other specified parts of digestive tract: Secondary | ICD-10-CM

## 2016-09-05 DIAGNOSIS — E875 Hyperkalemia: Secondary | ICD-10-CM | POA: Diagnosis present

## 2016-09-05 DIAGNOSIS — Z8249 Family history of ischemic heart disease and other diseases of the circulatory system: Secondary | ICD-10-CM

## 2016-09-05 DIAGNOSIS — Z885 Allergy status to narcotic agent status: Secondary | ICD-10-CM

## 2016-09-05 DIAGNOSIS — I2510 Atherosclerotic heart disease of native coronary artery without angina pectoris: Secondary | ICD-10-CM | POA: Diagnosis present

## 2016-09-05 DIAGNOSIS — I469 Cardiac arrest, cause unspecified: Secondary | ICD-10-CM | POA: Diagnosis not present

## 2016-09-05 DIAGNOSIS — R0600 Dyspnea, unspecified: Secondary | ICD-10-CM

## 2016-09-05 DIAGNOSIS — I110 Hypertensive heart disease with heart failure: Secondary | ICD-10-CM | POA: Diagnosis present

## 2016-09-05 DIAGNOSIS — J189 Pneumonia, unspecified organism: Secondary | ICD-10-CM | POA: Diagnosis not present

## 2016-09-05 DIAGNOSIS — E874 Mixed disorder of acid-base balance: Secondary | ICD-10-CM | POA: Diagnosis not present

## 2016-09-05 DIAGNOSIS — E662 Morbid (severe) obesity with alveolar hypoventilation: Secondary | ICD-10-CM | POA: Diagnosis present

## 2016-09-05 DIAGNOSIS — M549 Dorsalgia, unspecified: Secondary | ICD-10-CM | POA: Diagnosis present

## 2016-09-05 DIAGNOSIS — J440 Chronic obstructive pulmonary disease with acute lower respiratory infection: Secondary | ICD-10-CM | POA: Diagnosis present

## 2016-09-05 DIAGNOSIS — I5032 Chronic diastolic (congestive) heart failure: Secondary | ICD-10-CM | POA: Diagnosis present

## 2016-09-05 DIAGNOSIS — R0902 Hypoxemia: Secondary | ICD-10-CM | POA: Diagnosis present

## 2016-09-05 DIAGNOSIS — X58XXXA Exposure to other specified factors, initial encounter: Secondary | ICD-10-CM | POA: Diagnosis present

## 2016-09-05 DIAGNOSIS — F29 Unspecified psychosis not due to a substance or known physiological condition: Secondary | ICD-10-CM | POA: Diagnosis present

## 2016-09-05 DIAGNOSIS — Z881 Allergy status to other antibiotic agents status: Secondary | ICD-10-CM

## 2016-09-05 DIAGNOSIS — I5043 Acute on chronic combined systolic (congestive) and diastolic (congestive) heart failure: Secondary | ICD-10-CM

## 2016-09-05 DIAGNOSIS — R410 Disorientation, unspecified: Secondary | ICD-10-CM | POA: Diagnosis present

## 2016-09-05 DIAGNOSIS — Z7982 Long term (current) use of aspirin: Secondary | ICD-10-CM

## 2016-09-05 DIAGNOSIS — Z825 Family history of asthma and other chronic lower respiratory diseases: Secondary | ICD-10-CM

## 2016-09-05 DIAGNOSIS — Z4659 Encounter for fitting and adjustment of other gastrointestinal appliance and device: Secondary | ICD-10-CM

## 2016-09-05 DIAGNOSIS — B9562 Methicillin resistant Staphylococcus aureus infection as the cause of diseases classified elsewhere: Secondary | ICD-10-CM | POA: Diagnosis present

## 2016-09-05 DIAGNOSIS — Z8614 Personal history of Methicillin resistant Staphylococcus aureus infection: Secondary | ICD-10-CM

## 2016-09-05 DIAGNOSIS — T17990A Other foreign object in respiratory tract, part unspecified in causing asphyxiation, initial encounter: Secondary | ICD-10-CM | POA: Diagnosis not present

## 2016-09-05 DIAGNOSIS — Z87891 Personal history of nicotine dependence: Secondary | ICD-10-CM

## 2016-09-05 DIAGNOSIS — G8220 Paraplegia, unspecified: Secondary | ICD-10-CM | POA: Diagnosis present

## 2016-09-05 DIAGNOSIS — R0602 Shortness of breath: Secondary | ICD-10-CM | POA: Diagnosis present

## 2016-09-05 DIAGNOSIS — D72829 Elevated white blood cell count, unspecified: Secondary | ICD-10-CM | POA: Diagnosis present

## 2016-09-05 DIAGNOSIS — J159 Unspecified bacterial pneumonia: Principal | ICD-10-CM | POA: Diagnosis present

## 2016-09-05 DIAGNOSIS — R001 Bradycardia, unspecified: Secondary | ICD-10-CM | POA: Diagnosis present

## 2016-09-05 DIAGNOSIS — I5033 Acute on chronic diastolic (congestive) heart failure: Secondary | ICD-10-CM | POA: Diagnosis not present

## 2016-09-05 DIAGNOSIS — I272 Pulmonary hypertension, unspecified: Secondary | ICD-10-CM | POA: Diagnosis present

## 2016-09-05 DIAGNOSIS — J9819 Other pulmonary collapse: Secondary | ICD-10-CM

## 2016-09-05 DIAGNOSIS — J9622 Acute and chronic respiratory failure with hypercapnia: Secondary | ICD-10-CM | POA: Diagnosis present

## 2016-09-05 DIAGNOSIS — G8929 Other chronic pain: Secondary | ICD-10-CM | POA: Diagnosis present

## 2016-09-05 DIAGNOSIS — Z79899 Other long term (current) drug therapy: Secondary | ICD-10-CM

## 2016-09-05 DIAGNOSIS — J9621 Acute and chronic respiratory failure with hypoxia: Secondary | ICD-10-CM | POA: Diagnosis present

## 2016-09-05 DIAGNOSIS — J9600 Acute respiratory failure, unspecified whether with hypoxia or hypercapnia: Secondary | ICD-10-CM

## 2016-09-05 DIAGNOSIS — Z9981 Dependence on supplemental oxygen: Secondary | ICD-10-CM

## 2016-09-05 DIAGNOSIS — J9690 Respiratory failure, unspecified, unspecified whether with hypoxia or hypercapnia: Secondary | ICD-10-CM

## 2016-09-05 DIAGNOSIS — E876 Hypokalemia: Secondary | ICD-10-CM | POA: Diagnosis present

## 2016-09-05 HISTORY — DX: Heart failure, unspecified: I50.9

## 2016-09-05 HISTORY — DX: Unspecified injury at T7-t10 level of thoracic spinal cord, initial encounter: S24.103A

## 2016-09-05 HISTORY — DX: Paraplegia, unspecified: G82.20

## 2016-09-05 HISTORY — DX: Unspecified atrial fibrillation: I48.91

## 2016-09-05 LAB — URINALYSIS, ROUTINE W REFLEX MICROSCOPIC
Bilirubin Urine: NEGATIVE
Glucose, UA: NEGATIVE mg/dL
Ketones, ur: NEGATIVE mg/dL
Nitrite: NEGATIVE
PH: 5 (ref 5.0–8.0)
PROTEIN: 100 mg/dL — AB
SPECIFIC GRAVITY, URINE: 1.026 (ref 1.005–1.030)

## 2016-09-05 LAB — BLOOD GAS, ARTERIAL
Acid-Base Excess: 18.3 mmol/L — ABNORMAL HIGH (ref 0.0–2.0)
BICARBONATE: 49.1 mmol/L — AB (ref 20.0–28.0)
DRAWN BY: 308601
O2 Content: 3 L/min
O2 Saturation: 89.3 %
PATIENT TEMPERATURE: 98.6
PH ART: 7.286 — AB (ref 7.350–7.450)
pCO2 arterial: 106 mmHg (ref 32.0–48.0)
pO2, Arterial: 59.1 mmHg — ABNORMAL LOW (ref 83.0–108.0)

## 2016-09-05 LAB — EXPECTORATED SPUTUM ASSESSMENT W GRAM STAIN, RFLX TO RESP C

## 2016-09-05 LAB — BASIC METABOLIC PANEL
Anion gap: 10 (ref 5–15)
BUN: 18 mg/dL (ref 6–20)
CHLORIDE: 82 mmol/L — AB (ref 101–111)
CO2: 49 mmol/L — ABNORMAL HIGH (ref 22–32)
Calcium: 9 mg/dL (ref 8.9–10.3)
Creatinine, Ser: 0.35 mg/dL — ABNORMAL LOW (ref 0.61–1.24)
GFR calc Af Amer: >60 mL/min (ref >60)
GFR calc non Af Amer: >60 mL/min (ref >60)
Glucose, Bld: 132 mg/dL — ABNORMAL HIGH (ref 65–99)
POTASSIUM: 4.2 mmol/L (ref 3.5–5.1)
SODIUM: 141 mmol/L (ref 135–145)

## 2016-09-05 LAB — CBC
HCT: 44.9 % (ref 39.0–52.0)
HEMOGLOBIN: 13.1 g/dL (ref 13.0–17.0)
MCH: 29.8 pg (ref 26.0–34.0)
MCHC: 29.2 g/dL — ABNORMAL LOW (ref 30.0–36.0)
MCV: 102 fL — ABNORMAL HIGH (ref 78.0–100.0)
Platelets: 139 10*3/uL — ABNORMAL LOW (ref 150–400)
RBC: 4.4 MIL/uL (ref 4.22–5.81)
RDW: 13.3 % (ref 11.5–15.5)
WBC: 15.3 10*3/uL — AB (ref 4.0–10.5)

## 2016-09-05 LAB — BRAIN NATRIURETIC PEPTIDE: B Natriuretic Peptide: 158.8 pg/mL — ABNORMAL HIGH (ref 0.0–100.0)

## 2016-09-05 LAB — I-STAT CG4 LACTIC ACID, ED
Lactic Acid, Venous: 1.25 mmol/L (ref 0.5–1.9)
Lactic Acid, Venous: 0.82 mmol/L (ref 0.5–1.9)

## 2016-09-05 MED ORDER — POTASSIUM CHLORIDE CRYS ER 20 MEQ PO TBCR
20.0000 meq | EXTENDED_RELEASE_TABLET | Freq: Every day | ORAL | Status: DC
  Administered 2016-09-07: 20 meq via ORAL
  Filled 2016-09-05 (×2): qty 1

## 2016-09-05 MED ORDER — ENSURE ENLIVE PO LIQD
237.0000 mL | Freq: Two times a day (BID) | ORAL | Status: DC
  Administered 2016-09-06 – 2016-09-08 (×3): 237 mL via ORAL

## 2016-09-05 MED ORDER — HYDROMORPHONE HCL 1 MG/ML IJ SOLN
1.0000 mg | Freq: Once | INTRAMUSCULAR | Status: AC
  Administered 2016-09-05: 1 mg via INTRAVENOUS
  Filled 2016-09-05: qty 1

## 2016-09-05 MED ORDER — BISACODYL 10 MG RE SUPP: 10.0000 mg | Freq: Every day | RECTAL | Status: DC | PRN

## 2016-09-05 MED ORDER — LOSARTAN POTASSIUM 50 MG PO TABS
100.0000 mg | ORAL_TABLET | Freq: Every day | ORAL | Status: DC
  Administered 2016-09-06 – 2016-09-09 (×4): 100 mg via ORAL
  Filled 2016-09-05 (×4): qty 2

## 2016-09-05 MED ORDER — ONDANSETRON HCL 4 MG PO TABS: 4.0000 mg | ORAL_TABLET | Freq: Four times a day (QID) | ORAL | Status: DC | PRN

## 2016-09-05 MED ORDER — RISAQUAD PO CAPS
1.0000 | ORAL_CAPSULE | Freq: Every day | ORAL | Status: DC
  Administered 2016-09-06 – 2016-09-09 (×4): 1 via ORAL
  Filled 2016-09-05 (×3): qty 1

## 2016-09-05 MED ORDER — ACETAMINOPHEN 325 MG PO TABS
650.0000 mg | ORAL_TABLET | ORAL | Status: DC | PRN
  Administered 2016-09-05 – 2016-09-07 (×5): 650 mg via ORAL
  Filled 2016-09-05 (×5): qty 2

## 2016-09-05 MED ORDER — NITROGLYCERIN 0.4 MG SL SUBL: 0.4000 mg | SUBLINGUAL_TABLET | SUBLINGUAL | Status: DC | PRN

## 2016-09-05 MED ORDER — FLUTICASONE PROPIONATE 50 MCG/ACT NA SUSP
1.0000 | Freq: Every day | NASAL | Status: DC | PRN
  Filled 2016-09-05: qty 16

## 2016-09-05 MED ORDER — POTASSIUM CHLORIDE IN NACL 20-0.9 MEQ/L-% IV SOLN
INTRAVENOUS | Status: DC
  Administered 2016-09-05: 22:00:00 via INTRAVENOUS
  Filled 2016-09-05 (×4): qty 1000

## 2016-09-05 MED ORDER — DILTIAZEM HCL ER COATED BEADS 120 MG PO CP24
120.0000 mg | ORAL_CAPSULE | Freq: Every day | ORAL | Status: DC
  Administered 2016-09-06 – 2016-09-09 (×4): 120 mg via ORAL
  Filled 2016-09-05 (×5): qty 1

## 2016-09-05 MED ORDER — KETOROLAC TROMETHAMINE 30 MG/ML IJ SOLN
30.0000 mg | Freq: Four times a day (QID) | INTRAMUSCULAR | Status: DC | PRN
  Administered 2016-09-06: 30 mg via INTRAVENOUS
  Filled 2016-09-05: qty 1

## 2016-09-05 MED ORDER — VANCOMYCIN HCL 10 G IV SOLR
1250.0000 mg | Freq: Two times a day (BID) | INTRAVENOUS | Status: DC
  Administered 2016-09-06: 1250 mg via INTRAVENOUS
  Filled 2016-09-05 (×2): qty 1250

## 2016-09-05 MED ORDER — OXYCODONE HCL 5 MG PO TABS: 5.0000 mg | ORAL_TABLET | ORAL | Status: DC | PRN

## 2016-09-05 MED ORDER — ALBUTEROL SULFATE (2.5 MG/3ML) 0.083% IN NEBU
2.5000 mg | INHALATION_SOLUTION | RESPIRATORY_TRACT | Status: DC | PRN
  Administered 2016-09-07 – 2016-09-17 (×2): 2.5 mg via RESPIRATORY_TRACT
  Filled 2016-09-05 (×2): qty 3

## 2016-09-05 MED ORDER — LORATADINE 10 MG PO TABS
10.0000 mg | ORAL_TABLET | Freq: Every day | ORAL | Status: DC
  Administered 2016-09-06 – 2016-09-09 (×4): 10 mg via ORAL
  Filled 2016-09-05 (×4): qty 1

## 2016-09-05 MED ORDER — PIPERACILLIN-TAZOBACTAM 3.375 G IVPB 30 MIN
3.3750 g | Freq: Once | INTRAVENOUS | Status: AC
  Administered 2016-09-05: 3.375 g via INTRAVENOUS
  Filled 2016-09-05: qty 50

## 2016-09-05 MED ORDER — FUROSEMIDE 40 MG PO TABS
40.0000 mg | ORAL_TABLET | Freq: Three times a day (TID) | ORAL | Status: DC
  Administered 2016-09-05: 40 mg via ORAL
  Filled 2016-09-05: qty 1

## 2016-09-05 MED ORDER — VANCOMYCIN HCL IN DEXTROSE 1-5 GM/200ML-% IV SOLN
1000.0000 mg | Freq: Once | INTRAVENOUS | Status: AC
  Administered 2016-09-05: 1000 mg via INTRAVENOUS
  Filled 2016-09-05: qty 200

## 2016-09-05 MED ORDER — SODIUM CHLORIDE 0.9 % IV BOLUS (SEPSIS)
1000.0000 mL | Freq: Once | INTRAVENOUS | Status: AC
  Administered 2016-09-05: 1000 mL via INTRAVENOUS

## 2016-09-05 MED ORDER — FLEET ENEMA 7-19 GM/118ML RE ENEM: 1.0000 | ENEMA | Freq: Once | RECTAL | Status: DC | PRN

## 2016-09-05 MED ORDER — SODIUM CHLORIDE 0.9 % IV SOLN
INTRAVENOUS | Status: DC
  Administered 2016-09-05: 20 mL via INTRAVENOUS

## 2016-09-05 MED ORDER — IPRATROPIUM-ALBUTEROL 0.5-2.5 (3) MG/3ML IN SOLN
3.0000 mL | Freq: Four times a day (QID) | RESPIRATORY_TRACT | Status: DC
  Administered 2016-09-05 – 2016-09-06 (×4): 3 mL via RESPIRATORY_TRACT
  Filled 2016-09-05 (×5): qty 3

## 2016-09-05 MED ORDER — OXYCODONE HCL 5 MG PO TABS: 10.0000 mg | ORAL_TABLET | ORAL | Status: DC | PRN

## 2016-09-05 MED ORDER — CARVEDILOL 25 MG PO TABS
25.0000 mg | ORAL_TABLET | Freq: Two times a day (BID) | ORAL | Status: DC
  Administered 2016-09-06 – 2016-09-09 (×7): 25 mg via ORAL
  Filled 2016-09-05: qty 2
  Filled 2016-09-05 (×2): qty 1
  Filled 2016-09-05: qty 2
  Filled 2016-09-05: qty 1
  Filled 2016-09-05 (×3): qty 2
  Filled 2016-09-05 (×4): qty 1
  Filled 2016-09-05: qty 2
  Filled 2016-09-05: qty 1
  Filled 2016-09-05: qty 2

## 2016-09-05 MED ORDER — ASPIRIN 325 MG PO TABS
325.0000 mg | ORAL_TABLET | Freq: Every day | ORAL | Status: DC
  Administered 2016-09-06 – 2016-09-08 (×3): 325 mg via ORAL
  Filled 2016-09-05 (×4): qty 1

## 2016-09-05 MED ORDER — PIPERACILLIN-TAZOBACTAM 3.375 G IVPB
3.3750 g | Freq: Three times a day (TID) | INTRAVENOUS | Status: DC
  Administered 2016-09-05 – 2016-09-08 (×8): 3.375 g via INTRAVENOUS
  Filled 2016-09-05 (×9): qty 50

## 2016-09-05 MED ORDER — APIXABAN 5 MG PO TABS
5.0000 mg | ORAL_TABLET | Freq: Two times a day (BID) | ORAL | Status: DC
  Administered 2016-09-05 – 2016-09-09 (×8): 5 mg via ORAL
  Filled 2016-09-05 (×8): qty 1

## 2016-09-05 MED ORDER — OXYCODONE HCL 5 MG PO TABS: 15.0000 mg | ORAL_TABLET | ORAL | Status: DC | PRN

## 2016-09-05 MED ORDER — ONDANSETRON HCL 4 MG/2ML IJ SOLN: 4.0000 mg | Freq: Four times a day (QID) | INTRAMUSCULAR | Status: DC | PRN
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
## 2016-09-05 NOTE — ED Triage Notes (Signed)
EMS called to home.  Found patient Patient and oriented x4 with complaints of shortness of breath that started Tuesday.  Patient does have a Hx of pneumonia in the last 6 months.  Currently the patient has no pain

## 2016-09-05 NOTE — Significant Event (Signed)
Arterial blood gas obtained on patient per admitting orders showed a pH of 7.158, a PCO2 which was unable to calculate a bull by the machine, and a PO2 of 87.4 and this was on 5 L of oxygen. After consultation with respiratory care and Dr. Jamison NeighborNestor from pulmonary patient will be placed in stepdown unit with pulmonary consultation and use of BiPAP to get his carbon dioxide levels down. We'll recheck arterial blood gas in 2 hours and again in a.m.

## 2016-09-05 NOTE — H&P (Signed)
History and Physical    [name]:[id number] DOB: Jul 08, 1950 DOA: 08/31/2016  PCP: Shirlean Mylar, MD (Confirm with patient/family/NH records and if not entered, this has to be entered at [facility] point of entry) Patient coming from: home via EMS  I have personally briefly reviewed patient's old medical records in [facility]  Chief Complaint: Shortness of breath  HPI: [name] is a 66 y.o. 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 Patient has been treated by his primary care physician recently with Levaquin for a pneumonia. On Tuesday his wife noticed that he was getting more confused and that he began developing desaturations. Last evening she noted desaturations as low as the mid to low 80s. One episode as low as 81%. Today she contacted the patient's primary care physician who recommended he come to the emergency department for further evaluation and management. Patient has been confused today and not really able to give any significant history. Entire history is obtained from the patient's wife and the emergency department chart. Upon arrival of EMS the patient was alert and speaking but throughout the day the patient has become more confused and unable to give any valuable historical information. EKG obtained here in the emergency department showed atrial fibrillation. This is the first abnormal EKG we have on this patient. However what wife reports he is treated by Dr. Donnie Aho and has had atrial fibrillation since 2016. Given that he has failed outpatient treatment with levofloxacin the emergency department started vancomycin and Zosyn for treatment of community-acquired pneumonia which  is complicated by failed outpatient treatment. In the emergency department the patient was noted to be hypoxemic and requiring increased oxygen up to 5 L to obtain 90% however when I saw the patient he was barely able to keep his sats 88% with 5 L. ABG has not yet been obtained. And has been ordered. Patient was holding a Gatorade bottle that had significant amounts of dark green sputum in it. Apparently he has been collecting his sputum in this bottle. I gave the patient a sputum container with which to place his sputum so that we could send it to the lab.  Patient is being admitted to the hospital for community-acquired pneumonia with failed outpatient treatment and acute hypoxemia requiring increased oxygen (For level 3, the HPI must include 4+ descriptors: Location, Quality, Severity, Duration, Timing, Context, modifying factors, associated signs/symptoms and/or status of 3+ chronic problems.)  (Please avoid self-populating past medical history here) (The initial 2-3 lines should be focused and good to copy and paste in the HPI section of the daily progress note).   Review of Systems: As per HPI otherwise 10 point review of systems negative.  Unacceptable ROS statements: "10 systems reviewed," "Extensive" (without elaboration).  Acceptable ROS statements: "All others negative," "All others reviewed and are negative," and "All others unremarkable," with at LEAST ONE ROS documented Can't double dip - if using for HPI can't use for ROS Review of Systems  Unable to perform ROS: Mental status change   Past Medical History:  Diagnosis Date  . Coronary artery disease   . Hypertension     Past Surgical History:  Procedure Laterality Date  . APPENDECTOMY    . BACK SURGERY    .  CHOLECYSTECTOMY    . HERNIA REPAIR    . IRRIGATION AND DEBRIDEMENT ABSCESS  06/17/2011   Procedure: IRRIGATION AND DEBRIDEMENT ABSCESS;  Surgeon: Adolph Pollack, MD;  Location: [facility] ORS;  Service: General;  Laterality:  N/A;   debridement sacral decubitus  . VARICOSE VEIN SURGERY       reports that he quit smoking about 5 years ago. His smoking use included Cigarettes. He has a 15.00 pack-year smoking history. He has never used smokeless tobacco. He reports that he does not drink alcohol or use drugs.  Allergies  Allergen Reactions  . Levaquin [Levofloxacin]     Can not take while on Zanaflex   . Ciprofloxacin     Unknown-Noted on list  . Clonidine Derivatives     Unknown-Noted on list  . Darvocet [Propoxyphene N-Acetaminophen]     Unknown-Noted on list  . Decadron [Dexamethasone]     Unknown-Noted on list  . Lipitor [Atorvastatin Calcium]     Unknown-Noted on list  . Lisinopril     Unknown-Noted on list  . Methylprednisolone     Unknown-Noted on list  . Norvasc [Amlodipine Besylate]     Unknown-Noted on list  . Prednisolone     Unknown-Noted on list  . Sectral [Acebutolol Hcl]     Unknown-Noted on list  . Suprax [Cefixime]     Unknown-Noted on list    Family History  Problem Relation Age of Onset  . Asthma Brother   . Heart attack Father   . Breast cancer Mother   . Uterine cancer Mother   . Melanoma Mother   . Melanoma Brother      Prior to Admission medications   Medication Sig Start Date End Date Taking? Authorizing Provider  acetaminophen-codeine (TYLENOL #4) 300-60 MG per tablet Take 3 tablets by mouth 4 (four) times daily.    Yes [provider]  aspirin 325 MG tablet Take 325 mg by mouth daily with breakfast.    Yes [provider]  carvedilol (COREG) 25 MG tablet Take 25 mg by mouth 2 (two) times daily with a meal.   Yes [provider]  cetirizine (ZYRTEC) 10 MG tablet Take 20 mg by mouth daily with breakfast.    Yes [provider]  diltiazem (TIAZAC) 120 MG 24 hr capsule Take 120 mg by mouth daily with breakfast. 08/16/16  Yes [provider]  ELIQUIS 5 MG TABS tablet Take 5 mg by mouth 2 (two) times daily. 09/01/16  Yes  [provider]  fluticasone (FLONASE) 50 MCG/ACT nasal spray Place 1 spray into both nostrils daily as needed for allergies.    Yes [provider]  furosemide (LASIX) 20 MG tablet Take 40 mg by mouth 3 (three) times daily.    Yes [provider]  loperamide (IMODIUM A-D) 2 MG tablet Take 6 mg by mouth 2 (two) times daily as needed for diarrhea or loose stools (while on antibiotics).   Yes [provider]  losartan (COZAAR) 100 MG tablet Take 100 mg by mouth daily with breakfast.   Yes [provider]  nitroGLYCERIN (NITROSTAT) 0.4 MG SL tablet Place 0.4 mg under the tongue every 5 (five) minutes as needed for chest pain.    Yes [provider]  potassium chloride SA (K-DUR,KLOR-CON) 20 MEQ tablet Take 20 mEq by mouth daily with breakfast.    Yes [provider]  Probiotic Product (ALIGN) 4 MG CAPS Take 1 capsule by mouth daily with breakfast.  Yes [provider]  prochlorperazine (COMPAZINE) 10 MG tablet Take 10 mg by mouth every 4 (four) hours as needed for nausea or vomiting.    Yes [provider]  tiZANidine (ZANAFLEX) 4 MG tablet Take 4 mg by mouth 4 (four) times daily. 08/16/16  Yes [provider]    Physical Exam: Vitals:   02-Oct-2016 1831 10-02-16 1900 10/02/2016 1929 2016/10/02 1930  BP:  (!) 96/49  110/86  Pulse:  94  97  Resp:  (!) 27  (!) 26  Temp: 98 F (36.7 C)     TempSrc: Rectal     SpO2:  94% 95% 93%  Weight:      Height:        Constitutional: NAD, calm, comfortable Vitals:   10-02-2016 1831 10-02-2016 1900 2016/10/02 1929 10-02-16 1930  BP:  (!) 96/49  110/86  Pulse:  94  97  Resp:  (!) 27  (!) 26  Temp: 98 F (36.7 C)     TempSrc: Rectal     SpO2:  94% 95% 93%  Weight:      Height:       Eyes: PERRL, lids and conjunctivae normal ENMT: Mucous membranes are dry. Posterior pharynx clear of any exudate or lesions.Normal dentition.  Neck: thick, supple, no masses, no  thyromegaly Respiratory: Coarse breath sounds anteriorly, no wheezing, no crackles. Increased respiratory effort. With accessory muscle use of abdominal muscles. Decreased breath sounds throughout Cardiovascular: Irregularly irregular rate and rhythm, no murmurs / rubs / gallops. No extremity edema. 2+ pedal pulses. No carotid bruits.  Abdomen: Mild tenderness to palpation, no masses palpated. No hepatosplenomegaly. Bowel sounds positive.  Musculoskeletal: no clubbing / cyanosis. No joint deformity upper and lower extremities. Good ROM, no contractures. Normal muscle tone.  Skin: no rashes, lesions. No induration. Unable to fully examine skin due to patient's very large body habitus and inability to move himself. I am not strong enough to move him. Neurologic: Not alert or oriented wakes up but easily falls back to sleep. Unable to answer any questions Sensation intact, DTR normal.  Psychiatric: Clouded  (Anything < 9 systems with 2 bullets each down codes to level 1) (If patient refuses exam can't bill higher level) (Make sure to document decubitus ulcers present on admission -- if possible -- and whether patient has chronic indwelling catheter at time of admission)  Labs on Admission: I have personally reviewed following labs and imaging studies  CBC:  Recent Labs Lab 2016-10-02 1358  WBC 15.3*  HGB 13.1  HCT 44.9  MCV 102.0*  PLT 139*   Basic Metabolic Panel:  Recent Labs Lab 2016-10-02 1358  NA 141  K 4.2  CL 82*  CO2 49*  GLUCOSE 132*  BUN 18  CREATININE 0.35*  CALCIUM 9.0   GFR: Estimated Creatinine Clearance: 153.8 mL/min (A) (by C-G formula based on SCr of 0.35 mg/dL (L)). Liver Function Tests: No results for input(s): AST, ALT, ALKPHOS, BILITOT, PROT, ALBUMIN in the last 168 hours. No results for input(s): LIPASE, AMYLASE in the last 168 hours. No results for input(s): AMMONIA in the last 168 hours. Coagulation Profile: No results for input(s): INR, PROTIME in the  last 168 hours. Cardiac Enzymes: No results for input(s): CKTOTAL, CKMB, CKMBINDEX, TROPONINI in the last 168 hours. BNP (last 3 results) No results for input(s): PROBNP in the last 8760 hours. HbA1C: No results for input(s): HGBA1C in the last 72 hours. CBG: No results for input(s): GLUCAP in the last 168  hours. Lipid Profile: No results for input(s): CHOL, HDL, LDLCALC, TRIG, CHOLHDL, LDLDIRECT in the last 72 hours. Thyroid Function Tests: No results for input(s): TSH, T4TOTAL, FREET4, T3FREE, THYROIDAB in the last 72 hours. Anemia Panel: No results for input(s): VITAMINB12, FOLATE, FERRITIN, TIBC, IRON, RETICCTPCT in the last 72 hours. Urine analysis:    Component Value Date/Time   COLORURINE YELLOW 2016-10-21 1556   APPEARANCEUR TURBID (A) 2016-10-21 1556   LABSPEC 1.026 2016-10-21 1556   PHURINE 5.0 2016-10-21 1556   GLUCOSEU NEGATIVE 2016-10-21 1556   HGBUR LARGE (A) 2016-10-21 1556   BILIRUBINUR NEGATIVE 2016-10-21 1556   KETONESUR NEGATIVE 2016-10-21 1556   PROTEINUR 100 (A) 2016-10-21 1556   UROBILINOGEN 1.0 02/19/2014 1209   NITRITE NEGATIVE 2016-10-21 1556   LEUKOCYTESUR LARGE (A) 2016-10-21 1556    Radiological Exams on Admission: Dg Chest Portable 1 View  Result Date: 04/27/16 CLINICAL DATA:  66 year old presenting with acute onset of shortness of breath. EXAM: PORTABLE CHEST 1 VIEW COMPARISON:  CT chest 06/13/2016, 07/21/2012. Chest x-rays 02/23/2014, 02/19/2014 and earlier. FINDINGS: Markedly suboptimal inspiration which accounts for atelectasis of the right lung base. Confluent airspace consolidation in the left upper lobe and left lower lobe, likely associated with a left pleural effusion. Cardiac silhouette mildly enlarged for the AP technique, unchanged. Pulmonary vascularity normal. IMPRESSION: 1. Acute pneumonia involving the left upper lobe and left lower lobe, likely associated with a small left effusion. 2. Markedly suboptimal inspiration accounts for  atelectasis at the right lung base. Right lung otherwise clear. 3. Stable mild cardiomegaly without pulmonary edema. Electronically Signed   By: Hulan Saashomas  Lawrence M.D.   On: 2016-10-21 14:29    EKG: Independently reviewed. Shows atrial fibrillation at a rate of 102 bpm.  Assessment/Plan Principal Problem:   Community acquired bacterial pneumonia Active Problems:   Acute on chronic respiratory failure with hypoxia (HCC)   Hypoxia   Diastolic dysfunction with chronic heart failure (HCC)   Atrial fibrillation, chronic (HCC)   Paraplegia following spinal cord injury (HCC)   Restrictive lung disease   Morbid obesity (HCC)   CAD (coronary artery disease), native coronary artery  (please populate well all problems here in Problem List. (For example, if patient is on BP meds at home and you resume or decide to hold them, it is a problem that needs to be her. Same for CAD, COPD, HLD and so on)   1. Community-acquired bacterial pneumonia: Patient will be admitted into the hospital and started on vancomycin and Zosyn as well as pneumonia order set. We will obtain a sputum culture blood cultures 2 and provide patient with respiratory treatments. As patient has failed outpatient antibiotics he requires broad-spectrum antibiotics as noted above.  2. Acute on chronic respiratory failure with hypoxemia: Acute blood gas has not yet been obtained however based on the patient's current dioxide level on his BMP I expect his PCO2 to be fairly high. He may require BiPAP or CPAP treatments. For now will going to place him on a 35% face mask as he is a mouth breather and not getting oxygen adequately. He does have a very large neck and huge body habitus and may very well have sleep apnea.  3. Diastolic dysfunction with chronic heart failure currently well compensated will continue home medications.  4. Chronic atrial fibrillation: Patient is adequately anticoagulated takes Coreg as well as diltiazem and Eliquis at  home. We'll continue these medications.  5. Paraplegia following spinal cord injury: We'll continue home treatments have ordered a  trapeze for patient to use over his bed. I've also ordered a low air loss mattress to assist with prevention of any skin breakdown. Full skin assessment will need to be made one more assistance is available to evaluate the patient.  6. Restrictive lung disease: Related to his T8 paraplegia incentive spirometry has been ordered as well as respiratory treatments.  7. Morbid obesity: Discussed with the wife perhaps initiating a fasting of 18 hours during the day with high protein diet which may assist the patient in weight loss without altering his protein levels.  8. Coronary artery disease of native coronary artery I was unable to find any history regarding this however patient is on appropriate medications and followed by Dr. Donnie Aho of cardiology.  In summary 66 year old man treated for outpatient pneumonia with Levaquin who failed outpatient treatment and now being admitted with pneumonia and acute respiratory failure.  DVT prophylaxis: Eliquis Code Status: Full Family Communication: Spoke with patient's wife at length at the bedside.   Disposition Plan: Likely home Consults called: None Admission status: Inpatient   Lahoma Crocker MD FACP [facility] Pager [phone]  If 7PM-7AM, please contact night-coverage [email or web address] Password [email or web address] [employer]  06-Sep-2016, 7:39 PM

## 2016-09-05 NOTE — ED Notes (Signed)
Pt is alert and orinted x 4 and is verbally responsive. Pt appears to have generalized malaise. Respiratory therapy is with Pt obtaining ABG's.

## 2016-09-05 NOTE — Progress Notes (Signed)
Pharmacy Antibiotic Note  Glendon AxeMark C Ehinger is a 66 y.o. male admitted on 09/04/2016 with sepsis.  Pharmacy has been consulted for Vanc/Zosyn dosing.  Plan: Vancomycin 2g loading dose then 1250mg  IV every 12 hours.  Goal trough 15-20 mcg/mL.  Zosyn 3.375g IV q8 (extended interval infusion)  Height: 6\' 7"  (200.7 cm) Weight: (!) 350 lb (158.8 kg) IBW/kg (Calculated) : 93.7  Temp (24hrs), Avg:98 F (36.7 C), Min:98 F (36.7 C), Max:98 F (36.7 C)   Recent Labs Lab 08/17/2016 1358 09/04/2016 1525  WBC 15.3*  --   CREATININE 0.35*  --   LATICACIDVEN  --  1.25    Estimated Creatinine Clearance: 153.8 mL/min (A) (by C-G formula based on SCr of 0.35 mg/dL (L)).    Allergies  Allergen Reactions  . Levaquin [Levofloxacin]     Can not take while on Zanaflex   . Ciprofloxacin     Unknown-Noted on list  . Clonidine Derivatives     Unknown-Noted on list  . Darvocet [Propoxyphene N-Acetaminophen]     Unknown-Noted on list  . Decadron [Dexamethasone]     Unknown-Noted on list  . Lipitor [Atorvastatin Calcium]     Unknown-Noted on list  . Lisinopril     Unknown-Noted on list  . Methylprednisolone     Unknown-Noted on list  . Norvasc [Amlodipine Besylate]     Unknown-Noted on list  . Prednisolone     Unknown-Noted on list  . Sectral [Acebutolol Hcl]     Unknown-Noted on list  . Suprax [Cefixime]     Unknown-Noted on list     Thank you for allowing pharmacy to be a part of this patient's care.   Hessie KnowsJustin M Valoree Agent, PharmD, BCPS Pager [phone] 08/11/2016 4:23 PM

## 2016-09-05 NOTE — ED Provider Notes (Signed)
[facility]-EMERGENCY DEPT Provider Note   CSN: [id number] Arrival date & time: 08/16/2016  1254     History   Chief Complaint Chief Complaint  Patient presents with  . Shortness of Breath    HPI Glendon AxeMark C Chenette is a 66 y.o. male.  Patient with hx paraplegia/remote spinal cord injury, c/ increased cough and sob in the past several days. Cough episodic, non productive. No sore throat or runny nose. No fevers. Denies chest pain or discomfort. States his legs are chronically swollen. Normal urine output/has foley. States compliant w home meds. Was recently on abx for possible respiratory infection. Uses home o2, 3-4 liters. Former smoker.    The history is provided by the patient.  Shortness of Breath  Associated symptoms include cough. Pertinent negatives include no fever, no headaches, no sore throat, no neck pain, no chest pain, no vomiting, no abdominal pain and no rash.    Past Medical History:  Diagnosis Date  . Coronary artery disease   . Hypertension     Patient Active Problem List   Diagnosis Date Noted  . Asthmatic bronchitis 11/14/2014  . Thrush 11/14/2014  . Essential hypertension 11/14/2014  . CAD (coronary artery disease), native coronary artery 11/14/2014  . Diastolic dysfunction with chronic heart failure (HCC) 11/14/2014  . Chronic venous insufficiency 11/14/2014  . Edema 11/14/2014  . Chronic pain syndrome 11/14/2014  . Neurogenic bladder 11/14/2014  . Restrictive lung disease 11/09/2014  . Morbid obesity (HCC) 11/09/2014  . Insomnia secondary to chronic pain 11/09/2014  . Pancreatitis 02/19/2014  . Leukocytosis 02/19/2014  . Elevated liver function tests 02/19/2014  . Hypoxia 02/19/2014  . Decubitus ulcer of buttock, stage 4 (HCC) 06/13/2011  . Paraplegia following spinal cord injury (HCC) 06/13/2011  . Anemia 06/13/2011  . Malnourished (HCC) 06/13/2011    Past Surgical History:  Procedure Laterality Date  . APPENDECTOMY    . BACK SURGERY    .  CHOLECYSTECTOMY    . HERNIA REPAIR    . IRRIGATION AND DEBRIDEMENT ABSCESS  06/17/2011   Procedure: IRRIGATION AND DEBRIDEMENT ABSCESS;  Surgeon: Adolph Pollackodd J Rosenbower, MD;  Location: [facility];  Service: General;  Laterality: N/A;   debridement sacral decubitus  . VARICOSE VEIN SURGERY         Home Medications    Prior to Admission medications   Medication Sig Start Date End Date Taking? Authorizing Provider  acetaminophen-codeine (TYLENOL #4) 300-60 MG per tablet Take 3 tablets by mouth every 6 (six) hours as needed for moderate pain.     [provider]  albuterol (PROVENTIL HFA;VENTOLIN HFA) 108 (90 BASE) MCG/ACT inhaler Inhale 2 puffs into the lungs every 6 (six) hours as needed. For shortness of breath    [provider]  anti-nausea (EMETROL) solution Take 10 mLs by mouth every 15 (fifteen) minutes as needed for nausea or vomiting.    [provider]  aspirin 325 MG tablet Take 325 mg by mouth daily.    [provider]  benazepril (LOTENSIN) 40 MG tablet Take 1 tablet (40 mg total) by mouth daily. 06/21/11 06/20/12  Conley Canalanga, Simbiso, MD  benazepril (LOTENSIN) 40 MG tablet Take 40 mg by mouth daily.    [provider]  budesonide-formoterol (SYMBICORT) 160-4.5 MCG/ACT inhaler Inhale 2 puffs into the lungs 2 (two) times daily.    [provider]  carisoprodol (SOMA) 350 MG tablet Take 1,050 mg by mouth every 6 (six) hours.     [provider]  carvedilol (COREG) 6.25  MG tablet Take 6.25 mg by mouth every morning.    [provider]  cetirizine (ZYRTEC) 10 MG tablet Take 10 mg by mouth daily.    [provider]  diazepam (VALIUM) 10 MG tablet Take 10 mg by mouth at bedtime as needed for anxiety.    [provider]  feeding supplement, RESOURCE BREEZE, (RESOURCE BREEZE) LIQD Take 1 Container by mouth 2 (two) times daily between meals. Patient not taking: Reported on 11/09/2014 02/24/14   Edsel Petrin, DO    ferrous sulfate 325 (65 FE) MG tablet Take 975 mg by mouth daily.     [provider]  fluconazole (DIFLUCAN) 100 MG tablet Take 2 tablets by mouth the first day and then 1 tablet once daily until gone 11/09/14   Michele Mcalpine, MD  fluticasone [facility]) 50 MCG/ACT nasal spray Place 1 spray into both nostrils daily as needed.     [provider]  furosemide (LASIX) 20 MG tablet Take 40 mg by mouth 2 (two) times daily.     [provider]  guaiFENesin (MUCINEX) 600 MG 12 hr tablet Take 600 mg by mouth daily.    [provider]  lactobacillus (FLORANEX/LACTINEX) PACK Take 1 packet (1 g total) by mouth 3 (three) times daily with meals. 06/21/11   Conley Canal, MD  levofloxacin (LEVAQUIN) 750 MG tablet Take 1 tablet (750 mg total) by mouth daily. Patient not taking: Reported on 11/09/2014 02/24/14   Edsel Petrin, DO  Multiple Vitamin (MULITIVITAMIN) LIQD Take 5 mLs by mouth daily.    [provider]  nitroGLYCERIN (NITROSTAT) 0.4 MG SL tablet Place 0.4 mg under the tongue every 5 (five) minutes as needed for chest pain.     [provider]  Omega-3 Fatty Acids (OMEGA 3 PO) Take 1 capsule by mouth daily.    [provider]  ondansetron (ZOFRAN) 4 MG tablet Take 1 tablet (4 mg total) by mouth every 6 (six) hours as needed for nausea. 02/24/14   Edsel Petrin, DO  oxyCODONE-acetaminophen (PERCOCET) 10-325 MG per tablet Take 1 tablet by mouth at bedtime.    [provider]  Oxycodone-Aspirin [phone] MG TABS Take 1 tablet by mouth 3 (three) times daily as needed (for pain).    [provider]  potassium chloride SA (K-DUR,KLOR-CON) 20 MEQ tablet Take 20 mEq by mouth daily.    [provider]  Probiotic Product (ALIGN) 4 MG CAPS Take 1 capsule by mouth daily.    [provider]  prochlorperazine (COMPAZINE) 10 MG tablet Take 10 mg by mouth every 6 (six) hours as needed for nausea.     [provider]  Respiratory Therapy Supplies (FLUTTER) DEVI Use as directed 11/09/14   Michele Mcalpine, MD  Respiratory Therapy Supplies (ONE FLOW [place]) DEVI Use as directed 11/09/14   Michele Mcalpine, MD  senna (SENOKOT) 8.6 MG TABS tablet Take 1 tablet (8.6 mg total) by mouth daily. 02/24/14   Mikhail, Nita Sells, DO  tiotropium (SPIRIVA) 18 MCG inhalation capsule Place 18 mcg into inhaler and inhale daily.    [provider]    Family History Family History  Problem Relation Age of Onset  . Asthma Brother   . Heart attack Father   . Breast cancer Mother   . Uterine cancer Mother   . Melanoma Mother   . Melanoma Brother     Social History Social History  Substance Use Topics  . Smoking status: Former Smoker  Packs/day: 0.50    Years: 30.00    Types: Cigarettes    Quit date: 11/09/2010  . Smokeless tobacco: Not on file  . Alcohol use No     Allergies   Ciprofloxacin; Clonidine derivatives; Darvocet [propoxyphene n-acetaminophen]; Decadron [dexamethasone]; Lipitor [atorvastatin calcium]; Lisinopril; Methylprednisolone; Norvasc [amlodipine besylate]; Prednisolone; Sectral [acebutolol hcl]; and Suprax [cefixime]   Review of Systems Review of Systems  Constitutional: Negative for fever.  HENT: Negative for sore throat.   Eyes: Negative for redness.  Respiratory: Positive for cough and shortness of breath.   Cardiovascular: Negative for chest pain.  Gastrointestinal: Negative for abdominal pain and vomiting.  Genitourinary: Negative for flank pain.  Musculoskeletal: Negative for back pain and neck pain.  Skin: Negative for rash.  Neurological: Negative for headaches.  Hematological: Does not bruise/bleed easily.  Psychiatric/Behavioral: Negative for confusion.     Physical Exam Updated Vital Signs BP (!) 170/104 (BP Location: Left Arm)   Pulse 91   Temp 98 F (36.7 C) (Oral)   Resp (!) 45   SpO2 94%   Physical Exam  Constitutional: He appears  well-developed and well-nourished. No distress.  HENT:  Mouth/Throat: Oropharynx is clear and moist.  Eyes: Conjunctivae are normal.  Neck: Neck supple. No tracheal deviation present.  Cardiovascular: Normal rate, regular rhythm, normal heart sounds and intact distal pulses.   Pulmonary/Chest: Effort normal and breath sounds normal. No accessory muscle usage. No respiratory distress.  Abdominal: Soft. Bowel sounds are normal. He exhibits no distension.  Morbidly obese  Musculoskeletal:  Bilateral lower leg edema.   Neurological: He is alert.  Skin: Skin is warm and dry. No rash noted. He is not diaphoretic.  Psychiatric: He has a normal mood and affect.  Nursing note and vitals reviewed.    ED Treatments / Results  Labs (all labs ordered are listed, but only abnormal results are displayed) Labs Reviewed  CBC  BASIC METABOLIC PANEL  BRAIN NATRIURETIC PEPTIDE   Results for orders placed or performed during the hospital encounter of September 19, 2016  Blood Culture (routine x 2)  Result Value Ref Range   Specimen Description RIGHT ANTECUBITAL    Special Requests      BOTTLES DRAWN AEROBIC AND ANAEROBIC Blood Culture adequate volume   Culture      NO GROWTH < 24 HOURS Performed at [facility], [address], [place], [place] [address]    Report Status PENDING   Blood Culture (routine x 2)  Result Value Ref Range   Specimen Description RIGHT ANTECUBITAL    Special Requests      BOTTLES DRAWN AEROBIC AND ANAEROBIC Blood Culture adequate volume   Culture      NO GROWTH < 24 HOURS Performed at [facility], [address] [place], [place] [address]    Report Status PENDING   Culture, sputum-assessment  Result Value Ref Range   Specimen Description SPU    Special Requests NONE    Sputum evaluation THIS SPECIMEN IS ACCEPTABLE FOR SPUTUM CULTURE    Report Status Sep 19, 2016 FINAL   MRSA PCR Screening  Result Value Ref Range   MRSA by PCR NEGATIVE NEGATIVE  Culture,  respiratory (NON-Expectorated)  Result Value Ref Range   Specimen Description SPU    Special Requests NONE Reflexed from [id number]    Gram Stain      MODERATE WBC PRESENT, PREDOMINANTLY PMN FEW GRAM POSITIVE COCCI RARE GRAM POSITIVE RODS Performed at [facility], [address], [place],  [place] [address]    Culture PENDING    Report Status PENDING   CBC  Result Value Ref Range   WBC 15.3 (H) 4.0 - 10.5 K/uL   RBC 4.40 4.22 - 5.81 MIL/uL   Hemoglobin 13.1 13.0 - 17.0 g/dL   HCT 60.4 54.0 - 98.1 %   MCV 102.0 (H) 78.0 - 100.0 fL   MCH 29.8 26.0 - 34.0 pg   MCHC 29.2 (L) 30.0 - 36.0 g/dL   RDW 19.1 47.8 - 29.5 %   Platelets 139 (L) 150 - 400 K/uL  Basic metabolic panel  Result Value Ref Range   Sodium 141 135 - 145 mmol/L   Potassium 4.2 3.5 - 5.1 mmol/L   Chloride 82 (L) 101 - 111 mmol/L   CO2 49 (H) 22 - 32 mmol/L   Glucose, Bld 132 (H) 65 - 99 mg/dL   BUN 18 6 - 20 mg/dL   Creatinine, Ser 6.21 (L) 0.61 - 1.24 mg/dL   Calcium 9.0 8.9 - 30.8 mg/dL   GFR calc non Af Amer >60 >60 mL/min   GFR calc Af Amer >60 >60 mL/min   Anion gap 10 5 - 15  Brain natriuretic peptide  Result Value Ref Range   B Natriuretic Peptide 158.8 (H) 0.0 - 100.0 pg/mL  Urinalysis, Routine w reflex microscopic  Result Value Ref Range   Color, Urine YELLOW YELLOW   APPearance TURBID (A) CLEAR   Specific Gravity, Urine 1.026 1.005 - 1.030   pH 5.0 5.0 - 8.0   Glucose, UA NEGATIVE NEGATIVE mg/dL   Hgb urine dipstick LARGE (A) NEGATIVE   Bilirubin Urine NEGATIVE NEGATIVE   Ketones, ur NEGATIVE NEGATIVE mg/dL   Protein, ur 657 (A) NEGATIVE mg/dL   Nitrite NEGATIVE NEGATIVE   Leukocytes, UA LARGE (A) NEGATIVE   RBC / HPF TOO NUMEROUS TO COUNT 0 - 5 RBC/hpf   WBC, UA TOO NUMEROUS TO COUNT 0 - 5 WBC/hpf   Bacteria, UA RARE (A) NONE SEEN   Squamous Epithelial / LPF 0-5 (A) NONE SEEN   WBC Clumps PRESENT    Mucous PRESENT    Budding Yeast PRESENT   HIV antibody  Result Value Ref Range   HIV  Screen 4th Generation wRfx Non Reactive Non Reactive  Strep pneumoniae urinary antigen  Result Value Ref Range   Strep Pneumo Urinary Antigen NEGATIVE NEGATIVE  Blood gas, arterial  Result Value Ref Range   O2 Content 5.0 L/min   Delivery systems NASAL CANNULA    LHR 38 resp/min   pH, Arterial 7.163 (LL) 7.350 - 7.450   pCO2 arterial PENDING 32.0 - 48.0 mmHg   pO2, Arterial 87.4 83.0 - 108.0 mmHg   O2 Saturation 94.7 %   Patient temperature 98.0    Collection site LEFT RADIAL    Drawn by [id number]    Sample type ARTERIAL    Allens test (pass/fail) PASS PASS  Basic metabolic panel  Result Value Ref Range   Sodium 140 135 - 145 mmol/L   Potassium 5.1 3.5 - 5.1 mmol/L   Chloride 85 (L) 101 - 111 mmol/L   CO2 46 (H) 22 - 32 mmol/L   Glucose, Bld 126 (H) 65 - 99 mg/dL   BUN 18 6 - 20 mg/dL   Creatinine, Ser 9.52 (L) 0.61 - 1.24 mg/dL   Calcium 8.6 (L) 8.9 - 10.3 mg/dL   GFR calc non Af Amer >60 >60 mL/min   GFR calc Af Amer >60 >60 mL/min  Anion gap 9 5 - 15  CBC  Result Value Ref Range   WBC 12.7 (H) 4.0 - 10.5 K/uL   RBC 4.19 (L) 4.22 - 5.81 MIL/uL   Hemoglobin 12.5 (L) 13.0 - 17.0 g/dL   HCT 16.1 09.6 - 04.5 %   MCV 102.1 (H) 78.0 - 100.0 fL   MCH 29.8 26.0 - 34.0 pg   MCHC 29.2 (L) 30.0 - 36.0 g/dL   RDW 40.9 81.1 - 91.4 %   Platelets 117 (L) 150 - 400 K/uL  Blood gas, arterial  Result Value Ref Range   O2 Content 3.0 L/min   Delivery systems NASAL CANNULA    pH, Arterial 7.286 (L) 7.350 - 7.450   pCO2 arterial 106 (HH) 32.0 - 48.0 mmHg   pO2, Arterial 59.1 (L) 83.0 - 108.0 mmHg   Bicarbonate 49.1 (H) 20.0 - 28.0 mmol/L   Acid-Base Excess 18.3 (H) 0.0 - 2.0 mmol/L   O2 Saturation 89.3 %   Patient temperature 98.6    Collection site LEFT RADIAL    Drawn by [id number]    Sample type ARTERIAL DRAW    Allens test (pass/fail) PASS PASS  Blood gas, arterial  Result Value Ref Range   O2 Content 5.0 L/min   Delivery systems NASAL CANNULA    pH, Arterial 7.319 (L) 7.350 -  7.450   pCO2 arterial 96.0 (HH) 32.0 - 48.0 mmHg   pO2, Arterial 72.0 (L) 83.0 - 108.0 mmHg   Bicarbonate 48.0 (H) 20.0 - 28.0 mmol/L   Acid-Base Excess 18.1 (H) 0.0 - 2.0 mmol/L   O2 Saturation 94.0 %   Patient temperature 98.6    Collection site RIGHT RADIAL    Drawn by [id number]    Sample type ARTERIAL DRAW    Allens test (pass/fail) PASS PASS  Procalcitonin - Baseline  Result Value Ref Range   Procalcitonin 0.14 ng/mL  Strep pneumoniae urinary antigen  Result Value Ref Range   Strep Pneumo Urinary Antigen NEGATIVE NEGATIVE  Procalcitonin  Result Value Ref Range   Procalcitonin <0.10 ng/mL  Basic metabolic panel  Result Value Ref Range   Sodium 134 (L) 135 - 145 mmol/L   Potassium 4.9 3.5 - 5.1 mmol/L   Chloride 83 (L) 101 - 111 mmol/L   CO2 43 (H) 22 - 32 mmol/L   Glucose, Bld 147 (H) 65 - 99 mg/dL   BUN 14 6 - 20 mg/dL   Creatinine, Ser 5.78 (L) 0.61 - 1.24 mg/dL   Calcium 8.5 (L) 8.9 - 10.3 mg/dL   GFR calc non Af Amer >60 >60 mL/min   GFR calc Af Amer >60 >60 mL/min   Anion gap 8 5 - 15  CBC with Differential/Platelet  Result Value Ref Range   WBC 15.2 (H) 4.0 - 10.5 K/uL   RBC 3.95 (L) 4.22 - 5.81 MIL/uL   Hemoglobin 11.9 (L) 13.0 - 17.0 g/dL   HCT 46.9 (L) 62.9 - 52.8 %   MCV 97.7 78.0 - 100.0 fL   MCH 30.1 26.0 - 34.0 pg   MCHC 30.8 30.0 - 36.0 g/dL   RDW 41.3 24.4 - 01.0 %   Platelets 143 (L) 150 - 400 K/uL   Neutrophils Relative % 81 %   Neutro Abs 12.2 (H) 1.7 - 7.7 K/uL   Lymphocytes Relative 11 %   Lymphs Abs 1.7 0.7 - 4.0 K/uL   Monocytes Relative 7 %   Monocytes Absolute 1.1 (H) 0.1 - 1.0 K/uL   Eosinophils Relative  1 %   Eosinophils Absolute 0.2 0.0 - 0.7 K/uL   Basophils Relative 0 %   Basophils Absolute 0.0 0.0 - 0.1 K/uL  Blood gas, arterial  Result Value Ref Range   O2 Content 5.0 L/min   Delivery systems NASAL CANNULA    pH, Arterial 7.384 7.350 - 7.450   pCO2 arterial 87.3 (HH) 32.0 - 48.0 mmHg   pO2, Arterial 71.7 (L) 83.0 - 108.0 mmHg    Bicarbonate 51.0 (H) 20.0 - 28.0 mmol/L   Acid-Base Excess 21.3 (H) 0.0 - 2.0 mmol/L   O2 Saturation 93.7 %   Patient temperature 98.6    Collection site RIGHT RADIAL    Drawn by [id number]    Sample type ARTERIAL DRAW    Allens test (pass/fail) PASS PASS  I-Stat CG4 Lactic Acid, ED  (not at  [facility])  Result Value Ref Range   Lactic Acid, Venous 1.25 0.5 - 1.9 mmol/L  I-Stat CG4 Lactic Acid, ED  (not at  [facility])  Result Value Ref Range   Lactic Acid, Venous 0.82 0.5 - 1.9 mmol/L  ECHOCARDIOGRAM COMPLETE  Result Value Ref Range   Weight 6,021.2 oz   Height 79 in   BP 131/98 mmHg   Dg Chest Portable 1 View  Result Date: Sep 30, 2016 CLINICAL DATA:  66 year old presenting with acute onset of shortness of breath. EXAM: PORTABLE CHEST 1 VIEW COMPARISON:  CT chest 06/13/2016, 07/21/2012. Chest x-rays 02/23/2014, 02/19/2014 and earlier. FINDINGS: Markedly suboptimal inspiration which accounts for atelectasis of the right lung base. Confluent airspace consolidation in the left upper lobe and left lower lobe, likely associated with a left pleural effusion. Cardiac silhouette mildly enlarged for the AP technique, unchanged. Pulmonary vascularity normal. IMPRESSION: 1. Acute pneumonia involving the left upper lobe and left lower lobe, likely associated with a small left effusion. 2. Markedly suboptimal inspiration accounts for atelectasis at the right lung base. Right lung otherwise clear. 3. Stable mild cardiomegaly without pulmonary edema. Electronically Signed   By: Hulan Saas M.D.   On: 09/30/2016 14:29     EKG  EKG Interpretation None       Radiology Dg Chest Portable 1 View  Result Date: 2016-09-30 CLINICAL DATA:  66 year old presenting with acute onset of shortness of breath. EXAM: PORTABLE CHEST 1 VIEW COMPARISON:  CT chest 06/13/2016, 07/21/2012. Chest x-rays 02/23/2014, 02/19/2014 and earlier. FINDINGS: Markedly suboptimal inspiration which accounts for atelectasis of the right lung  base. Confluent airspace consolidation in the left upper lobe and left lower lobe, likely associated with a left pleural effusion. Cardiac silhouette mildly enlarged for the AP technique, unchanged. Pulmonary vascularity normal. IMPRESSION: 1. Acute pneumonia involving the left upper lobe and left lower lobe, likely associated with a small left effusion. 2. Markedly suboptimal inspiration accounts for atelectasis at the right lung base. Right lung otherwise clear. 3. Stable mild cardiomegaly without pulmonary edema. Electronically Signed   By: Hulan Saas M.D.   On: 09-30-16 14:29    Procedures Procedures (including critical care time)  Medications Ordered in ED Medications  0.9 %  sodium chloride infusion (not administered)     Initial Impression / Assessment and Plan / ED Course  I have reviewed the triage vital signs and the nursing notes.  Pertinent labs & imaging results that were available during my care of the patient were reviewed by me and considered in my medical decision making (see chart for details).  Labs. Cxr.  Reviewed nursing notes and prior charts for additional history.  Labs. Cultures.   Iv abx.   Labs remain pending.  Signed out to Dr Juleen China, that when labs resulted, consult medical service for admission.     Final Clinical Impressions(s) / ED Diagnoses   Final diagnoses:  None    New Prescriptions New Prescriptions   No medications on file     Cathren Laine, MD 09/07/16 [phone]

## 2016-09-05 NOTE — Progress Notes (Signed)
Pt. unable to tolerate BiPAP, Dr. Nestor,MD/CCM made aware with [facility].

## 2016-09-05 NOTE — Progress Notes (Signed)
-   Wound care consulted 

## 2016-09-05 NOTE — Care Management (Signed)
ED CM reviewed CM consult. Patient is being admitted. CM will follow for discharge needs. Physicist, medicalCrystal Keoshia Steinmetz RN BSN CCM

## 2016-09-05 NOTE — ED Provider Notes (Addendum)
Patient received from Dr. Denton LankSteinl. Patient discussed.  In summary: 66 year old male with history of "spinal cord stroke" with T8 paraplegia since 2009. Has had cough for 10 days and completed 7 days of Levaquin as an outpatient. Fevers, + worsening shortness of breath diaphoresis for the last 24 hours and he presents here.  Testing shows acute pneumonia of left upper and lower lobes. Normally is on 3 L of chronic home O2. His requiring 5 L to maintain 90% saturations. Lactate 1.25. Has indwelling Foley. Has too numerous to count RBCs and WBCs. Nitrate negative. He is present. 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  This is a community-acquired pneumonia in patient with limited pulmonary reserve secondary to paraplegia, adding failed outpatient Levaquin and hypoxemia and acute respiratory failure. Not hypotensive or tachycardic. Normal lactate. Has been given vancomycin and Zosyn. I will discuss with hospitalist regarding admission.  18:30: D/W Hospitalist. Pt HR 96, but AF on EKG that is new for patient.    [name], [name], MD 08/29/2016 [name]    [name], [name], MD 09/02/2016 [id number]

## 2016-09-05 NOTE — ED Notes (Signed)
Bed: [id number] Expected date:  Expected time:  Means of arrival:  Comments: EMs 66 yo, SOB

## 2016-09-06 ENCOUNTER — Inpatient Hospital Stay (HOSPITAL_COMMUNITY): Payer: 59

## 2016-09-06 DIAGNOSIS — J9611 Chronic respiratory failure with hypoxia: Secondary | ICD-10-CM

## 2016-09-06 DIAGNOSIS — I509 Heart failure, unspecified: Secondary | ICD-10-CM

## 2016-09-06 DIAGNOSIS — I482 Chronic atrial fibrillation: Secondary | ICD-10-CM

## 2016-09-06 DIAGNOSIS — I5033 Acute on chronic diastolic (congestive) heart failure: Secondary | ICD-10-CM

## 2016-09-06 LAB — CBC
HCT: 42.8 % (ref 39.0–52.0)
Hemoglobin: 12.5 g/dL — ABNORMAL LOW (ref 13.0–17.0)
MCH: 29.8 pg (ref 26.0–34.0)
MCHC: 29.2 g/dL — ABNORMAL LOW (ref 30.0–36.0)
MCV: 102.1 fL — ABNORMAL HIGH (ref 78.0–100.0)
PLATELETS: 117 10*3/uL — AB (ref 150–400)
RBC: 4.19 MIL/uL — AB (ref 4.22–5.81)
RDW: 13.4 % (ref 11.5–15.5)
WBC: 12.7 10*3/uL — ABNORMAL HIGH (ref 4.0–10.5)

## 2016-09-06 LAB — STREP PNEUMONIAE URINARY ANTIGEN
STREP PNEUMO URINARY ANTIGEN: NEGATIVE
Strep Pneumo Urinary Antigen: NEGATIVE

## 2016-09-06 LAB — BASIC METABOLIC PANEL
ANION GAP: 9 (ref 5–15)
BUN: 18 mg/dL (ref 6–20)
CALCIUM: 8.6 mg/dL — AB (ref 8.9–10.3)
CHLORIDE: 85 mmol/L — AB (ref 101–111)
CO2: 46 mmol/L — ABNORMAL HIGH (ref 22–32)
CREATININE: 0.34 mg/dL — AB (ref 0.61–1.24)
GFR calc Af Amer: >60 mL/min (ref >60)
GFR calc non Af Amer: >60 mL/min (ref >60)
Glucose, Bld: 126 mg/dL — ABNORMAL HIGH (ref 65–99)
Potassium: 5.1 mmol/L (ref 3.5–5.1)
Sodium: 140 mmol/L (ref 135–145)

## 2016-09-06 LAB — BLOOD GAS, ARTERIAL
ACID-BASE EXCESS: 18.1 mmol/L — AB (ref 0.0–2.0)
Bicarbonate: 48 mmol/L — ABNORMAL HIGH (ref 20.0–28.0)
Drawn by: 441261
O2 Content: 5 L/min
O2 SAT: 94 %
PATIENT TEMPERATURE: 98.6
PCO2 ART: 96 mmHg — AB (ref 32.0–48.0)
PO2 ART: 72 mmHg — AB (ref 83.0–108.0)
pH, Arterial: 7.319 — ABNORMAL LOW (ref 7.350–7.450)

## 2016-09-06 LAB — PROCALCITONIN: Procalcitonin: 0.14 ng/mL

## 2016-09-06 LAB — ECHOCARDIOGRAM COMPLETE
Height: 79 in
Weight: 6021.2 oz

## 2016-09-06 LAB — HIV ANTIBODY (ROUTINE TESTING W REFLEX): HIV Screen 4th Generation wRfx: NONREACTIVE

## 2016-09-06 LAB — MRSA PCR SCREENING: MRSA by PCR: NEGATIVE

## 2016-09-06 MED ORDER — PERFLUTREN LIPID MICROSPHERE
1.0000 mL | INTRAVENOUS | Status: AC | PRN
Start: 2016-09-06 — End: 2016-09-06
  Administered 2016-09-06: 5 mL via INTRAVENOUS
  Filled 2016-09-06: qty 10

## 2016-09-06 MED ORDER — BENZONATATE 100 MG PO CAPS
100.0000 mg | ORAL_CAPSULE | Freq: Three times a day (TID) | ORAL | Status: DC | PRN
Start: 2016-09-06 — End: 2016-09-09
  Administered 2016-09-07 – 2016-09-09 (×5): 100 mg via ORAL
  Filled 2016-09-06 (×6): qty 1

## 2016-09-06 MED ORDER — IPRATROPIUM-ALBUTEROL 0.5-2.5 (3) MG/3ML IN SOLN
3.0000 mL | Freq: Four times a day (QID) | RESPIRATORY_TRACT | Status: DC
  Administered 2016-09-07 – 2016-09-10 (×14): 3 mL via RESPIRATORY_TRACT
  Filled 2016-09-06 (×14): qty 3

## 2016-09-06 MED ORDER — HYDROMORPHONE HCL 1 MG/ML IJ SOLN
0.5000 mg | Freq: Once | INTRAMUSCULAR | Status: AC
  Administered 2016-09-07: 0.5 mg via INTRAVENOUS
  Filled 2016-09-06: qty 0.5

## 2016-09-06 MED ORDER — OXYCODONE HCL ER 20 MG PO T12A
20.0000 mg | EXTENDED_RELEASE_TABLET | Freq: Two times a day (BID) | ORAL | Status: DC
  Administered 2016-09-06 – 2016-09-09 (×7): 20 mg via ORAL
  Filled 2016-09-06 (×7): qty 1

## 2016-09-06 MED ORDER — FUROSEMIDE 10 MG/ML IJ SOLN
60.0000 mg | Freq: Two times a day (BID) | INTRAMUSCULAR | Status: DC
Start: 2016-09-06 — End: 2016-09-08
  Administered 2016-09-06 – 2016-09-07 (×4): 60 mg via INTRAVENOUS
  Filled 2016-09-06 (×5): qty 6

## 2016-09-06 MED ORDER — HYDROCOD POLST-CPM POLST ER 10-8 MG/5ML PO SUER
5.0000 mL | Freq: Two times a day (BID) | ORAL | Status: DC | PRN
  Administered 2016-09-06 – 2016-09-09 (×4): 5 mL via ORAL
  Filled 2016-09-06 (×4): qty 5

## 2016-09-06 NOTE — Progress Notes (Signed)
PROGRESS NOTE [facility]   [name]   ZOX:[id number] DOB: 1951-03-23  DOA: 09/02/2016 PCP: Shirlean MylarWebb, Carol, MD   Brief Narrative:  [name] is a 66 year old male with medical history significant for chronic A. 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 Prior to admission patient was treated for pneumonia with Levaquin but despite treatment patient became confused and hypoxic with saturations into the low 80s. In the ED patient was found to be hypoxemic and hypercarbic, BiPAP was ordered but patient did not tolerate critical care evaluation patient and deemed that no intervention was necessary. Patient was placed on high flow oxygen, admitted to step down unit and placed on broad-spectrum antibiotics.  Subjective: Patient seen and examined, he reports shortness of breath has not improved, he sounds very rattle and is coughing constantly. Patient reported he is not  Assessment & Plan: Acute respiratory failure with hypercarbia and hypoxia - likely multifactorial due to uncompensated OSA, CAP and patient also with components of CHF.  Failed outpatient treatment for pneumonia Continue IV Zosyn for now, vancomycin discontinued as MRSA was negative, however patient with history of MRSA skin infection, low threshold to restart vancomycin if patient does not improve. Patient is intolerant to CPAP and BiPAP given claustrophobia, this point I do not think that is necessary but patient willing to try it become a need.  We'll add IV Lasix, given patient is wet on exam Obtain ECHO  Repeat ABG in the morning Wean O2 as tolerated to keep O2 above 88% Pulmonary recommendations appreciated Continue Pulmicort nebulizer twice a day and DuoNeb 4 times a day Incentive spirometry We'll keep in stepdown for now.  PNA - CAP failed outpatient  treatment with Levaquin  Abx as above Follow up cultures  Trend procalcitonin  Follow up strep and legionella antigen   Acute on chronic diastolic CHF  BNP mild elevated but not accurate given morbid obesity, On exam diffuse crackles and LE edema  See above, getting ECHO and Lasix  Daily weight, Low salt diet  Monitor I&Os  Chronic Afib  HR well controlled Patient on Eliquis  Continue Coreg and Cardizem   Paraplegia due to spinal cord injury w/ chronic pain - stable  Continue current management  Will request bariatric bed  Will adjust pain management medications - patient taking Tylenol # 4 3 tabs 4x a day will change to Oxycontin 20 mg BID.   Morbid obesity  Follow up as outpatient   DVT prophylaxis: Eliquis  Code Status: FULL  Family Communication: None at bedside  Disposition Plan: Home when medically stable   Consultants:   PCCM   Procedures:   None   Antimicrobials: Anti-infectives    Start     Dose/Rate Route Frequency Ordered Stop   09/06/16 0400  vancomycin (VANCOCIN) 1,250 mg in sodium chloride 0.9 % 250 mL IVPB  Status:  Discontinued     1,250 mg 166.7 mL/hr over 90 Minutes Intravenous Every 12 hours 08/29/2016 1926 09/06/16 0854   09/06/16 0000  piperacillin-tazobactam (ZOSYN) IVPB 3.375 g     3.375 g 12.5 mL/hr over 240 Minutes Intravenous Every 8 hours 08/31/2016 1625     08/18/2016 1630  vancomycin (VANCOCIN) IVPB 1000 mg/200 mL premix     1,000 mg 200 mL/hr over 60 Minutes Intravenous  Once 08/26/2016 1625 08/11/2016 1813   09/04/2016 1445  piperacillin-tazobactam (  ZOSYN) IVPB 3.375 g     3.375 g 100 mL/hr over 30 Minutes Intravenous  Once 09-12-16 1437 12-Sep-2016 1605   09/12/16 1445  vancomycin (VANCOCIN) IVPB 1000 mg/200 mL premix     1,000 mg 200 mL/hr over 60 Minutes Intravenous  Once 09/12/2016 1437 September 12, 2016 1705        Objective: Vitals:   09/06/16 0500 09/06/16 0600 09/06/16 0800 09/06/16 0814  BP: (!) 172/96 (!) 131/98    Pulse:    88  Resp:  (!) 27 (!) 23  (!) 25  Temp:   97.4 F (36.3 C)   TempSrc:   Oral   SpO2: 92% 95%  94%  Weight:      Height:        Intake/Output Summary (Last 24 hours) at 09/06/16 0855 Last data filed at 09/06/16 0600  Gross per 24 hour  Intake          3226.67 ml  Output              425 ml  Net          2801.67 ml   Filed Weights   09/12/2016 1454 Sep 12, 2016 2132  Weight: (!) 158.8 kg (350 lb) (!) 170.7 kg (376 lb 5.2 oz)    Examination:  General exam: Mild anxious  Respiratory system: Anterior auscultation, diffuse crackles, breath sounds diminished b/l.  Cardiovascular system: S1S2 Irr Irr, systolic murmur best at [facility]  Gastrointestinal system: Morbid obese, ND NT, unable to palpate for organomegaly Central nervous system: Alert and oriented.  Extremities: LE edema 1+, symmetric     Skin: No rash or lesions  Psychiatry: Judgement and insight appear normal. Mood & affect appropriate.    Data Reviewed: I have personally reviewed following labs and imaging studies  CBC:  Recent Labs Lab 09/12/16 1358 09/06/16 0518  WBC 15.3* 12.7*  HGB 13.1 12.5*  HCT 44.9 42.8  MCV 102.0* 102.1*  PLT 139* 117*   Basic Metabolic Panel:  Recent Labs Lab 2016-09-12 1358 09/06/16 0518  NA 141 140  K 4.2 5.1  CL 82* 85*  CO2 49* 46*  GLUCOSE 132* 126*  BUN 18 18  CREATININE 0.35* 0.34*  CALCIUM 9.0 8.6*   Sepsis Labs:  Recent Labs Lab September 12, 2016 1525 2016/09/12 1922  LATICACIDVEN 1.25 0.82    Recent Results (from the past 240 hour(s))  Culture, sputum-assessment     Status: None   Collection Time: 2016-09-12 10:26 PM  Result Value Ref Range Status   Specimen Description SPU  Final   Special Requests NONE  Final   Sputum evaluation THIS SPECIMEN IS ACCEPTABLE FOR SPUTUM CULTURE  Final   Report Status 09-12-2016 FINAL  Final  MRSA PCR Screening     Status: None   Collection Time: September 12, 2016 10:56 PM  Result Value Ref Range Status   MRSA by PCR NEGATIVE NEGATIVE Final    Comment:         The GeneXpert MRSA Assay (FDA approved for NASAL specimens only), is one component of a comprehensive MRSA colonization surveillance program. It is not intended to diagnose MRSA infection nor to guide or monitor treatment for MRSA infections.       Radiology Studies: Dg Chest Portable 1 View  Result Date: 2016-09-12 CLINICAL DATA:  66 year old presenting with acute onset of shortness of breath. EXAM: PORTABLE CHEST 1 VIEW COMPARISON:  CT chest 06/13/2016, 07/21/2012. Chest x-rays 02/23/2014, 02/19/2014 and earlier. FINDINGS: Markedly suboptimal inspiration which accounts for atelectasis of the  right lung base. Confluent airspace consolidation in the left upper lobe and left lower lobe, likely associated with a left pleural effusion. Cardiac silhouette mildly enlarged for the AP technique, unchanged. Pulmonary vascularity normal. IMPRESSION: 1. Acute pneumonia involving the left upper lobe and left lower lobe, likely associated with a small left effusion. 2. Markedly suboptimal inspiration accounts for atelectasis at the right lung base. Right lung otherwise clear. 3. Stable mild cardiomegaly without pulmonary edema. Electronically Signed   By: Hulan Saas M.D.   On: September 08, 2016 14:29      Scheduled Meds: . acidophilus  1 capsule Oral Q breakfast  . apixaban  5 mg Oral BID  . aspirin  325 mg Oral Q breakfast  . carvedilol  25 mg Oral BID WC  . diltiazem  120 mg Oral Q breakfast  . feeding supplement (ENSURE ENLIVE)  237 mL Oral BID BM  . furosemide  60 mg Intravenous BID  . ipratropium-albuterol  3 mL Nebulization Q6H  . loratadine  10 mg Oral Daily  . losartan  100 mg Oral Q breakfast  . oxyCODONE  20 mg Oral Q12H  . potassium chloride SA  20 mEq Oral Q breakfast   Continuous Infusions: . sodium chloride 20 mL (09-08-2016 1510)  . piperacillin-tazobactam (ZOSYN)  IV 3.375 g (09/06/16 0739)     LOS: 1 day    Latrelle Dodrill, MD Pager: Text Page via [email or web address]    [phone]  If 7PM-7AM, please contact night-coverage [email or web address] Password [email or web address] 09/06/2016, 8:55 AM

## 2016-09-06 NOTE — Progress Notes (Addendum)
Initial Nutrition Assessment  DOCUMENTATION CODES:   Morbid obesity  INTERVENTION:    Dysphagia 3, thin liquid diet   Continue Ensure Enlive po BID, each supplement provides 350 kcal and 20 grams of protein  NUTRITION DIAGNOSIS:   Increased nutrient needs related to acute illness, wound healing as evidenced by estimated needs  GOAL:   Patient will meet greater than or equal to 90% of their needs  MONITOR:   PO intake, Supplement acceptance, Labs, Weight trends, Skin, I & O's  REASON FOR ASSESSMENT:   Malnutrition Screening Tool  ASSESSMENT:   66 yo Male with PMH of  atrial fibrillation, diastolic congestive heart failure, obstructive sleep apnea noncompliant with CPAP, and paraplegia following spinal cord injury at the T8 level. He is morbidly obese and was seen in the pulmonary clinic in 2016 where a sleep study found an AHI of 25.1/hr of sleep. He was ordered CPAP, but was unable to get it for insurance reasons. He was recently treated as an outpatient for pneumonia with a course of Levaquin.5/29 he began to have increasing confusion until 5/30 when he became hypoxic. His wife contacted his PCP who recommended he present to the emergency department for further evaluation.  Pt admitted with acute respiratory failure, bacterial PNA. Complaining he hasn't had anything to eat.  Wife at bedside.   Wife states pt has been trying to eat a "high protein" diet given his leg wounds.  Has some trouble chewing tough foods and/or meats. No unintentional weight loss reported. Labs and medications reviewed.  Nutrition focused physical exam completed to upper body. No muscle or subcutaneous fat depletion noticed. N/A to lower body given spinal cord injury.  Diet Order:  Diet regular Room service appropriate? Yes; Fluid consistency: Thin  Skin:  Wound (see comment) (multiple wounds on legs, CWOCN assessment pending)  Last BM:  N/A  Height:   Ht Readings from Last 1 Encounters:   2016-11-08 6\' 7"  (2.007 m)   Weight:   Wt Readings from Last 1 Encounters:  2016-11-08 (!) 376 lb 5.2 oz (170.7 kg)   Ideal Body Weight:  99.5 kg  BMI:  Body mass index is 42.39 kg/m.  Estimated Nutritional Needs:   Kcal:  2300-2500  Protein:  120-130 gm  Fluid:  2.3-2.5 L  EDUCATION NEEDS:   No education needs identified at this time  Maureen ChattersKatie Dnasia Gauna, RD, LDN Pager #: [phone] After-Hours Pager #: [phone]

## 2016-09-06 NOTE — Consult Note (Addendum)
WOC Nurse wound consult note Reason for Consult: Consult requested for sacrum and left leg.  Pt has a high BMI and is very difficult to turn; required 3 people to assess wounds and it was difficult to obtain accurate measurements while holding patient over and changing the dressing; measurements are an approximation while quickly visualizing the wounds to buttocks and sacrum.. 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 Mod amt yellow drainage, no odor Left and right buttocks with red, moist, shaggy stage 2 wounds; appearance is consistent with pressure and shear. Each side approx 5X4X.1cm, scant amt yellow drainage, no odor  Pressure Injury POA: Yes Right leg with multiple dry scabs and several red partial thickness abrasions. Left leg with chronic full thickness wound; approx 28X.5X.3cm, dark red, moist woundbed, located in a deep valley. Small amt tan drainage, no odor. Dressing procedure/placement/frequency: Ordered Bariatric air bed to make it easier to reposition patient.  Aquacel to absorb drainage and provide antimicrobial benefits to left leg and sacrum.  Discussed plan of care with patient and he verbalized understanding. Please re-consult if further assistance is needed.  Thank-you,  Cammie Mcgeeawn Rylee Nuzum MSN, RN, CWOCN, Tom BeanWCN-AP, CNS [phone]

## 2016-09-06 NOTE — Care Management Note (Signed)
Case Management Note  Patient Details  Name: [name] MRN: [id number] Date of Birth: [birth year]  Subjective/Objective:                   66 y.o. 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 Patient has been treated by his primary care physician recently with Levaquin for a pneumonia. On Tuesday his wife noticed that he was getting more confused and that he began developing desaturations. Last evening she noted desaturations as low as the mid to low 80s. One episode as low as 81%. Today she contacted the patient's primary care physician who recommended he come to the emergency department for further evaluation and management. Patient has been confused today and not really able to give any significant history. Entire history is obtained from the patient's wife and the emergency department chart. Upon arrival of EMS the patient was alert and speaking but throughout the day the patient has become more confused and unable to give any valuable historical information. EKG obtained here in the emergency department showed atrial fibrillation. This is the first abnormal EKG we have on this patient. However what wife reports he is treated by Dr. Donnie Ahoilley and has had atrial fibrillation since 2016. Given that he has failed outpatient treatment with levofloxacin the emergency department started vancomycin and Zosyn for treatment of community-acquired pneumonia which is complicated by failed outpatient treatment. In the emergency department the patient was noted to be hypoxemic and requiring increased oxygen up to 5 L to obtain 90% however when I saw the patient he was barely able to keep his sats 88% with 5 L. ABG has  not yet been obtained. And has been ordered. Patient was holding a Gatorade bottle that had significant amounts of dark green sputum in it. Apparently he has been collecting his sputum in this bottle.  Action/Plan: Date:  September 06, 2016  Chart reviewed for concurrent status and case management needs.  Will continue to follow patient progress.  Discharge Planning: following for needs  Expected discharge date: [id number]  Marcelle SmilingRhonda Davis, BSN, SocorroRN3, [place]CCM   [phone]   Expected Discharge Date:   (unknown)               Expected Discharge Plan:  Home w Home Health Services  In-House Referral:  Clinical Social Work  Discharge planning Services  CM Consult  Post Acute Care Choice:    Choice offered to:     DME Arranged:    DME Agency:     HH Arranged:    HH Agency:     Status of Service:  In process, will continue to follow  If discussed at Long Length of Stay Meetings, dates discussed:    Additional Comments:  Golda AcreDavis, Rhonda Lynn, RN 09/06/2016, 9:19 AM

## 2016-09-06 NOTE — Progress Notes (Signed)
  Echocardiogram 2D Echocardiogram with definity has been performed.  Leta JunglingCooper, Marcelino Campos M 09/06/2016, 3:45 PM

## 2016-09-06 DEATH — deceased

## 2016-09-07 ENCOUNTER — Encounter (HOSPITAL_COMMUNITY): Payer: Self-pay | Admitting: *Deleted

## 2016-09-07 ENCOUNTER — Inpatient Hospital Stay (HOSPITAL_COMMUNITY): Payer: 59

## 2016-09-07 DIAGNOSIS — J159 Unspecified bacterial pneumonia: Principal | ICD-10-CM

## 2016-09-07 DIAGNOSIS — J9621 Acute and chronic respiratory failure with hypoxia: Secondary | ICD-10-CM

## 2016-09-07 DIAGNOSIS — J9622 Acute and chronic respiratory failure with hypercapnia: Secondary | ICD-10-CM

## 2016-09-07 DIAGNOSIS — I5043 Acute on chronic combined systolic (congestive) and diastolic (congestive) heart failure: Secondary | ICD-10-CM

## 2016-09-07 DIAGNOSIS — L8990 Pressure ulcer of unspecified site, unspecified stage: Secondary | ICD-10-CM | POA: Insufficient documentation

## 2016-09-07 LAB — CBC WITH DIFFERENTIAL/PLATELET
BASOS PCT: 0 %
Basophils Absolute: 0 10*3/uL (ref 0.0–0.1)
EOS ABS: 0.2 10*3/uL (ref 0.0–0.7)
Eosinophils Relative: 1 %
HEMATOCRIT: 38.6 % — AB (ref 39.0–52.0)
Hemoglobin: 11.9 g/dL — ABNORMAL LOW (ref 13.0–17.0)
LYMPHS ABS: 1.7 10*3/uL (ref 0.7–4.0)
Lymphocytes Relative: 11 %
MCH: 30.1 pg (ref 26.0–34.0)
MCHC: 30.8 g/dL (ref 30.0–36.0)
MCV: 97.7 fL (ref 78.0–100.0)
MONOS PCT: 7 %
Monocytes Absolute: 1.1 10*3/uL — ABNORMAL HIGH (ref 0.1–1.0)
NEUTROS ABS: 12.2 10*3/uL — AB (ref 1.7–7.7)
Neutrophils Relative %: 81 %
Platelets: 143 10*3/uL — ABNORMAL LOW (ref 150–400)
RBC: 3.95 MIL/uL — AB (ref 4.22–5.81)
RDW: 13.4 % (ref 11.5–15.5)
WBC: 15.2 10*3/uL — AB (ref 4.0–10.5)

## 2016-09-07 LAB — BASIC METABOLIC PANEL
ANION GAP: 8 (ref 5–15)
BUN: 14 mg/dL (ref 6–20)
CHLORIDE: 83 mmol/L — AB (ref 101–111)
CO2: 43 mmol/L — AB (ref 22–32)
CREATININE: 0.38 mg/dL — AB (ref 0.61–1.24)
Calcium: 8.5 mg/dL — ABNORMAL LOW (ref 8.9–10.3)
GFR calc Af Amer: >60 mL/min (ref >60)
GFR calc non Af Amer: >60 mL/min (ref >60)
Glucose, Bld: 147 mg/dL — ABNORMAL HIGH (ref 65–99)
Potassium: 4.9 mmol/L (ref 3.5–5.1)
SODIUM: 134 mmol/L — AB (ref 135–145)

## 2016-09-07 LAB — BLOOD GAS, ARTERIAL
Acid-Base Excess: 21.3 mmol/L — ABNORMAL HIGH (ref 0.0–2.0)
Bicarbonate: 51 mmol/L — ABNORMAL HIGH (ref 20.0–28.0)
Drawn by: 270211
O2 CONTENT: 5 L/min
O2 SAT: 93.7 %
PATIENT TEMPERATURE: 98.6
pCO2 arterial: 87.3 mmHg (ref 32.0–48.0)
pH, Arterial: 7.384 (ref 7.350–7.450)
pO2, Arterial: 71.7 mmHg — ABNORMAL LOW (ref 83.0–108.0)

## 2016-09-07 LAB — PROCALCITONIN: Procalcitonin: <0.1 ng/mL

## 2016-09-07 MED ORDER — HYDRALAZINE HCL 20 MG/ML IJ SOLN
10.0000 mg | Freq: Four times a day (QID) | INTRAMUSCULAR | Status: DC | PRN
Start: 2016-09-07 — End: 2016-09-09
  Administered 2016-09-07 – 2016-09-08 (×4): 10 mg via INTRAVENOUS
  Filled 2016-09-07 (×4): qty 1

## 2016-09-07 MED ORDER — SPIRONOLACTONE 25 MG PO TABS
25.0000 mg | ORAL_TABLET | Freq: Every day | ORAL | Status: DC
  Administered 2016-09-07 – 2016-09-09 (×3): 25 mg via ORAL
  Filled 2016-09-07 (×3): qty 1

## 2016-09-07 NOTE — Assessment & Plan Note (Addendum)
Claustrophobic to bipap Clinically 09/08/2016 much improved with aggressive diuresis in setting of acute on chronic systolic chf and CAP . However,  cxr 24h ago with ? Collapse left lung v effusion v both  Plan Hendrix o2 for pil;se ox > 88% Continue lasix but increase dose (bp high and creat ok, bun ok) Continue flutter valve Add incentive spirometry

## 2016-09-07 NOTE — Assessment & Plan Note (Signed)
Continue lasix but increase dose

## 2016-09-07 NOTE — Assessment & Plan Note (Addendum)
Afebrile as of  09/08/2016  WBC improving 09/08/2016    Plan Get cxr to monitor progress Continue  Anti-infectives    Start     Dose/Rate Route Frequency Ordered Stop   09/06/16 0400  vancomycin (VANCOCIN) 1,250 mg in sodium chloride 0.9 % 250 mL IVPB  Status:  Discontinued     1,250 mg 166.7 mL/hr over 90 Minutes Intravenous Every 12 hours 08/22/2016 1926 09/06/16 0854   09/06/16 0000  piperacillin-tazobactam (ZOSYN) IVPB 3.375 g     3.375 g 12.5 mL/hr over 240 Minutes Intravenous Every 8 hours 08/31/2016 1625     08/23/2016 1630  vancomycin (VANCOCIN) IVPB 1000 mg/200 mL premix     1,000 mg 200 mL/hr over 60 Minutes Intravenous  Once 08/25/2016 1625 08/18/2016 1813   08/13/2016 1445  piperacillin-tazobactam (ZOSYN) IVPB 3.375 g     3.375 g 100 mL/hr over 30 Minutes Intravenous  Once 08/15/2016 1437 08/07/2016 1605   08/16/2016 1445  vancomycin (VANCOCIN) IVPB 1000 mg/200 mL premix     1,000 mg 200 mL/hr over 60 Minutes Intravenous  Once 08/24/2016 1437 09/03/2016 1705

## 2016-09-07 NOTE — Progress Notes (Signed)
Paged MD Selena BattenKim about the pt's, persistent coughing spells and evaluated BP. BP is  191/91, 122, 27, 93 6L, Tussionex and Tessalon peals and hydralazine ordered. I will continue to monitor.

## 2016-09-07 NOTE — Assessment & Plan Note (Signed)
Worsening but no apparent reason other than ? Diuresis  P,lan monitor

## 2016-09-07 NOTE — Assessment & Plan Note (Addendum)
Stable as of 09/08/2016   Recent Labs Lab 08/29/2016 1358 09/06/16 0518 09/07/16 0709 09/08/16 0346  HGB 13.1 12.5* 11.9* 13.2     Plan - PRBC for hgb </= 6.9gm%    - exceptions are   -  if ACS susepcted/confirmed then transfuse for hgb </= 8.0gm%,  or    -  active bleeding with hemodynamic instability, then transfuse regardless of hemoglobin value   At at all times try to transfuse 1 unit prbc as possible with exception of active hemorrhage

## 2016-09-07 NOTE — Progress Notes (Signed)
PROGRESS NOTE [facility]   ZEDDIE NJIE   WUJ:[id number] DOB: 03/12/1951  DOA: 09-09-2016 PCP: Shirlean Mylar, MD   Brief Narrative:  [name] is a 66 year old male with medical history significant for chronic A. 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 Prior to admission patient was treated for pneumonia with Levaquin but despite treatment patient became confused and hypoxic with saturations into the low 80s. In the ED patient was found to be hypoxemic and hypercarbic, BiPAP was ordered but patient did not tolerate critical care evaluation patient and deemed that no intervention was necessary. Patient was placed on high flow oxygen, admitted to step down unit and placed on broad-spectrum antibiotics.  Subjective: Patient seen and examined, continues to improve, having good diuresis, negative 3L.    Assessment & Plan: Acute respiratory failure with hypercarbia and hypoxia - likely multifactorial due to uncompensated OSA, CAP and CHF  Failed outpatient treatment for pneumonia.  Continue IV Zosyn for now, vancomycin discontinued as MRSA was negative, however patient with history of MRSA skin infection, low threshold to restart vancomycin if patient does not improve. Patient is intolerant to CPAP and BiPAP given claustrophobia, this point I do not think that is necessary but patient willing to try it become a need.  Continue IV diuresis  Repeated ABG pH 7.3 improved  Wean O2 as tolerated to keep O2 above 88% Pulmonary recommendations appreciated Continue Pulmicort nebulizer twice a day and DuoNeb 4 times a day Incentive spirometry We'll keep in stepdown for now.  PNA - CAP failed outpatient treatment with Levaquin  Abx as above Follow up cultures  Trend procalcitonin  Follow up strep and legionella antigen   Acute on  chronic systolic and diastolic CHF  BNP mild elevated but not accurate given morbid obesity, On exam diffuse crackles and LE edema  ECHO showed EF 40-45% down from 50-55%  Continue IV Lasix  Will add Aldactone  Daily weight, Low salt diet  Monitor I&Os  Chronic Afib  HR well controlled Patient on Eliquis  Continue Coreg and Cardizem   Paraplegia due to spinal cord injury w/ chronic pain - stable  Continue current management  Will request bariatric bed  Pain meds adjusted, pain well controlled  - patient was taking Tylenol # 4 3 tabs 4x a day at home changed to Oxycontin 20 mg BID with good control   Morbid obesity  Follow up as outpatient   DVT prophylaxis: Eliquis  Code Status: FULL  Family Communication: None at bedside  Disposition Plan: Home when medically stable, keep in SDU for now as patient is high risk for intubation  Consultants:   PCCM   Procedures:   None   Antimicrobials: Anti-infectives    Start     Dose/Rate Route Frequency Ordered Stop   09/06/16 0400  vancomycin (VANCOCIN) 1,250 mg in sodium chloride 0.9 % 250 mL IVPB  Status:  Discontinued     1,250 mg 166.7 mL/hr over 90 Minutes Intravenous Every 12 hours 09-09-16 1926 09/06/16 0854   09/06/16 0000  piperacillin-tazobactam (ZOSYN) IVPB 3.375 g     3.375 g 12.5 mL/hr over 240 Minutes Intravenous Every 8 hours 09-09-2016 1625     September 09, 2016 1630  vancomycin (VANCOCIN) IVPB 1000 mg/200 mL premix     1,000 mg 200 mL/hr over 60 Minutes Intravenous  Once September 09, 2016 1625 09/09/16 1813  09-12-16 1445  piperacillin-tazobactam (ZOSYN) IVPB 3.375 g     3.375 g 100 mL/hr over 30 Minutes Intravenous  Once 09-12-2016 1437 09/12/2016 1605   09-12-16 1445  vancomycin (VANCOCIN) IVPB 1000 mg/200 mL premix     1,000 mg 200 mL/hr over 60 Minutes Intravenous  Once 2016-09-12 1437 12-Sep-2016 1705       Objective: Vitals:   09/07/16 0753 09/07/16 0800 09/07/16 1104 09/07/16 1215  BP:  (!) 177/102    Pulse:      Resp:  (!)  33    Temp: 98 F (36.7 C)  97.3 F (36.3 C)   TempSrc: Oral  Oral   SpO2:  90%  96%  Weight:      Height:        Intake/Output Summary (Last 24 hours) at 09/07/16 1410 Last data filed at 09/07/16 0900  Gross per 24 hour  Intake           766.67 ml  Output             2900 ml  Net         -2133.33 ml   Filed Weights   09/12/2016 1454 2016-09-12 2132  Weight: (!) 158.8 kg (350 lb) (!) 170.7 kg (376 lb 5.2 oz)    Examination:  General: Pt is alert, awake, not in acute distress Cardiovascular: Irr Irr , S1/S2 +, no rubs, no gallops Respiratory: Anterior auscultation air entry improved, crackles improved  Abdominal:  Obese Soft, NT, ND, bowel sounds + Extremities: LE pitting 1+, no cyanosis   Data Reviewed: I have personally reviewed following labs and imaging studies  CBC:  Recent Labs Lab 09-12-16 1358 09/06/16 0518 09/07/16 0709  WBC 15.3* 12.7* 15.2*  NEUTROABS  --   --  12.2*  HGB 13.1 12.5* 11.9*  HCT 44.9 42.8 38.6*  MCV 102.0* 102.1* 97.7  PLT 139* 117* 143*   Basic Metabolic Panel:  Recent Labs Lab 09-12-2016 1358 09/06/16 0518 09/07/16 0709  NA 141 140 134*  K 4.2 5.1 4.9  CL 82* 85* 83*  CO2 49* 46* 43*  GLUCOSE 132* 126* 147*  BUN 18 18 14   CREATININE 0.35* 0.34* 0.38*  CALCIUM 9.0 8.6* 8.5*   Sepsis Labs:  Recent Labs Lab 09-12-2016 1525 Sep 12, 2016 1922 09/06/16 0920 09/07/16 0709  PROCALCITON  --   --  0.14 <0.10  LATICACIDVEN 1.25 0.82  --   --     Recent Results (from the past 240 hour(s))  Blood Culture (routine x 2)     Status: None (Preliminary result)   Collection Time: 09-12-16  2:37 PM  Result Value Ref Range Status   Specimen Description RIGHT ANTECUBITAL  Final   Special Requests   Final    BOTTLES DRAWN AEROBIC AND ANAEROBIC Blood Culture adequate volume   Culture   Final    NO GROWTH < 24 HOURS Performed at [facility], [address] [place], [place] [address]    Report Status PENDING  Incomplete  Blood  Culture (routine x 2)     Status: None (Preliminary result)   Collection Time: 12-Sep-2016  3:15 PM  Result Value Ref Range Status   Specimen Description RIGHT ANTECUBITAL  Final   Special Requests   Final    BOTTLES DRAWN AEROBIC AND ANAEROBIC Blood Culture adequate volume   Culture   Final    NO GROWTH < 24 HOURS Performed at [facility], [address] [place], [place] [address]  Report Status PENDING  Incomplete  Culture, sputum-assessment     Status: None   Collection Time: 09/27/16 10:26 PM  Result Value Ref Range Status   Specimen Description SPU  Final   Special Requests NONE  Final   Sputum evaluation THIS SPECIMEN IS ACCEPTABLE FOR SPUTUM CULTURE  Final   Report Status 2017-03-21 FINAL  Final  Culture, respiratory (NON-Expectorated)     Status: None (Preliminary result)   Collection Time: 09/27/16 10:26 PM  Result Value Ref Range Status   Specimen Description SPU  Final   Special Requests NONE Reflexed from [id number]  Final   Gram Stain   Final    MODERATE WBC PRESENT, PREDOMINANTLY PMN FEW GRAM POSITIVE COCCI RARE GRAM POSITIVE RODS    Culture   Final    CULTURE REINCUBATED FOR BETTER GROWTH Performed at [facility], [address] [place], [place] [address]    Report Status PENDING  Incomplete  MRSA PCR Screening     Status: None   Collection Time: 09/27/16 10:56 PM  Result Value Ref Range Status   MRSA by PCR NEGATIVE NEGATIVE Final    Comment:        The GeneXpert MRSA Assay (FDA approved for NASAL specimens only), is one component of a comprehensive MRSA colonization surveillance program. It is not intended to diagnose MRSA infection nor to guide or monitor treatment for MRSA infections.      Radiology Studies: Dg Chest Port 1 View  Result Date: 09/07/2016 CLINICAL DATA:  Cough and shortness of breath EXAM: PORTABLE CHEST 1 VIEW COMPARISON:  2017-03-21 FINDINGS: Near complete whiteout of the left hemithorax noted. There is some apparent  associated left hemithoracic volume loss. Apparent progression of large left pleural effusion. Cardiopericardial silhouette obscured. Vascular congestion noted right lung without right pleural effusion. The visualized bony structures of the thorax are intact. Telemetry leads overlie the chest. IMPRESSION: Interval progression of left lung collapse/ consolidation with pleural effusion. Electronically Signed   By: Kennith CenterEric  Mansell M.D.   On: 09/07/2016 09:39   Dg Chest Portable 1 View  Result Date: 11/26/2016 CLINICAL DATA:  66 year old presenting with acute onset of shortness of breath. EXAM: PORTABLE CHEST 1 VIEW COMPARISON:  CT chest 06/13/2016, 07/21/2012. Chest x-rays 02/23/2014, 02/19/2014 and earlier. FINDINGS: Markedly suboptimal inspiration which accounts for atelectasis of the right lung base. Confluent airspace consolidation in the left upper lobe and left lower lobe, likely associated with a left pleural effusion. Cardiac silhouette mildly enlarged for the AP technique, unchanged. Pulmonary vascularity normal. IMPRESSION: 1. Acute pneumonia involving the left upper lobe and left lower lobe, likely associated with a small left effusion. 2. Markedly suboptimal inspiration accounts for atelectasis at the right lung base. Right lung otherwise clear. 3. Stable mild cardiomegaly without pulmonary edema. Electronically Signed   By: Hulan Saashomas  Lawrence M.D.   On: 2017-03-21 14:29    Scheduled Meds: . acidophilus  1 capsule Oral Q breakfast  . apixaban  5 mg Oral BID  . aspirin  325 mg Oral Q breakfast  . carvedilol  25 mg Oral BID WC  . diltiazem  120 mg Oral Q breakfast  . feeding supplement (ENSURE ENLIVE)  237 mL Oral BID BM  . furosemide  60 mg Intravenous BID  . ipratropium-albuterol  3 mL Nebulization QID  . loratadine  10 mg Oral Daily  . losartan  100 mg Oral Q breakfast  . oxyCODONE  20 mg Oral Q12H  . potassium chloride SA  20  mEq Oral Q breakfast   Continuous Infusions: . sodium chloride  Stopped (09/07/16 0805)  . piperacillin-tazobactam (ZOSYN)  IV 3.375 g (09/07/16 0805)     LOS: 2 days    Latrelle Dodrill, MD Pager: Text Page via [email or web address]  [phone]  If 7PM-7AM, please contact night-coverage [email or web address] Password [email or web address] 09/07/2016, 2:10 PM
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
## 2016-09-08 ENCOUNTER — Inpatient Hospital Stay (HOSPITAL_COMMUNITY): Payer: 59

## 2016-09-08 DIAGNOSIS — I10 Essential (primary) hypertension: Secondary | ICD-10-CM

## 2016-09-08 LAB — CULTURE, RESPIRATORY W GRAM STAIN: Culture: NORMAL

## 2016-09-08 LAB — PROCALCITONIN

## 2016-09-08 LAB — BASIC METABOLIC PANEL
ANION GAP: 11 (ref 5–15)
BUN: 11 mg/dL (ref 6–20)
CALCIUM: 8.9 mg/dL (ref 8.9–10.3)
CO2: 43 mmol/L — ABNORMAL HIGH (ref 22–32)
Chloride: 80 mmol/L — ABNORMAL LOW (ref 101–111)
Creatinine, Ser: 0.32 mg/dL — ABNORMAL LOW (ref 0.61–1.24)
GFR calc Af Amer: >60 mL/min (ref >60)
Glucose, Bld: 130 mg/dL — ABNORMAL HIGH (ref 65–99)
POTASSIUM: 4.3 mmol/L (ref 3.5–5.1)
SODIUM: 134 mmol/L — AB (ref 135–145)

## 2016-09-08 LAB — HEPATIC FUNCTION PANEL
ALT: 12 U/L — ABNORMAL LOW (ref 17–63)
AST: 12 U/L — ABNORMAL LOW (ref 15–41)
Albumin: 3.7 g/dL (ref 3.5–5.0)
Alkaline Phosphatase: 84 U/L (ref 38–126)
BILIRUBIN INDIRECT: 0.7 mg/dL (ref 0.3–0.9)
Bilirubin, Direct: 0.2 mg/dL (ref 0.1–0.5)
Total Bilirubin: 0.9 mg/dL (ref 0.3–1.2)
Total Protein: 6.9 g/dL (ref 6.5–8.1)

## 2016-09-08 LAB — CBC WITH DIFFERENTIAL/PLATELET
BASOS ABS: 0 10*3/uL (ref 0.0–0.1)
Basophils Relative: 0 %
EOS ABS: 0.2 10*3/uL (ref 0.0–0.7)
EOS PCT: 1 %
HCT: 41.3 % (ref 39.0–52.0)
Hemoglobin: 13.2 g/dL (ref 13.0–17.0)
Lymphocytes Relative: 14 %
Lymphs Abs: 2 10*3/uL (ref 0.7–4.0)
MCH: 30.4 pg (ref 26.0–34.0)
MCHC: 32 g/dL (ref 30.0–36.0)
MCV: 95.2 fL (ref 78.0–100.0)
Monocytes Absolute: 1.1 10*3/uL — ABNORMAL HIGH (ref 0.1–1.0)
Monocytes Relative: 8 %
Neutro Abs: 10.6 10*3/uL — ABNORMAL HIGH (ref 1.7–7.7)
Neutrophils Relative %: 77 %
PLATELETS: 144 10*3/uL — AB (ref 150–400)
RBC: 4.34 MIL/uL (ref 4.22–5.81)
RDW: 13.5 % (ref 11.5–15.5)
WBC: 14 10*3/uL — AB (ref 4.0–10.5)

## 2016-09-08 LAB — TROPONIN I

## 2016-09-08 LAB — CULTURE, RESPIRATORY

## 2016-09-08 LAB — MAGNESIUM: Magnesium: 2 mg/dL (ref 1.7–2.4)

## 2016-09-08 LAB — PHOSPHORUS: PHOSPHORUS: 4.2 mg/dL (ref 2.5–4.6)

## 2016-09-08 MED ORDER — FUROSEMIDE 10 MG/ML IJ SOLN
80.0000 mg | Freq: Three times a day (TID) | INTRAMUSCULAR | Status: DC
  Administered 2016-09-08: 80 mg via INTRAVENOUS
  Filled 2016-09-08 (×4): qty 8

## 2016-09-08 MED ORDER — AMOXICILLIN-POT CLAVULANATE 875-125 MG PO TABS
1.0000 | ORAL_TABLET | Freq: Two times a day (BID) | ORAL | Status: DC
  Administered 2016-09-08 – 2016-09-09 (×3): 1 via ORAL
  Filled 2016-09-08 (×3): qty 1

## 2016-09-08 NOTE — Progress Notes (Deleted)
Pharmacy Antibiotic Note  Glendon AxeMark C Vanetten is a [age] y.o. male admitted on 09/04/2016 with sepsis.  Pharmacy has been consulted for Vanc/Zosyn dosing.  Plan: Cont Zosyn 3.375g IV Q8H infused over 4hrs. Renal function is stable. Pharmacy will sign-off.  Height: 6\' 7"  (200.7 cm) Weight: (!) 363 lb (164.7 kg) IBW/kg (Calculated) : 93.7  Temp (24hrs), Avg:97.8 F (36.6 C), Min:97.3 F (36.3 C), Max:97.9 F (36.6 C)   Recent Labs Lab 08/30/2016 1358 08/20/2016 1525 09/02/2016 1922 09/06/16 0518 09/07/16 0709 09/08/16 0346  WBC 15.3*  --   --  12.7* 15.2* 14.0*  CREATININE 0.35*  --   --  0.34* 0.38* 0.32*  LATICACIDVEN  --  1.25 0.82  --   --   --     Estimated Creatinine Clearance: 156.9 mL/min (A) (by C-G formula based on SCr of 0.32 mg/dL (L)).    Allergies  Allergen Reactions  . Levaquin [Levofloxacin]     Can not take while on Zanaflex   . Ciprofloxacin     Unknown-Noted on list  . Clonidine Derivatives     Unknown-Noted on list  . Darvocet [Propoxyphene N-Acetaminophen]     Unknown-Noted on list  . Decadron [Dexamethasone]     Unknown-Noted on list  . Lipitor [Atorvastatin Calcium]     Unknown-Noted on list  . Lisinopril     Unknown-Noted on list  . Methylprednisolone     Unknown-Noted on list  . Norvasc [Amlodipine Besylate]     Unknown-Noted on list  . Prednisolone     Unknown-Noted on list  . Sectral [Acebutolol Hcl]     Unknown-Noted on list  . Suprax [Cefixime]     Unknown-Noted on list   Antimicrobials this admission: 5/31 Vanc >> 6/1 5/31 Zosyn >>  Dose adjustments this admission:  Microbiology results: 5/31 MRSA PCR: neg 5/31: sputum: few GPC, rare GPR 5/31 Bcx: ngtd 5/31 Strep Ag: neg  Thank you for allowing pharmacy to be a part of this patient's care.  Charolotte Ekeom Nikhil Osei, PharmD, pager [phone]. 09/08/2016,10:03 AM.
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
## 2016-09-08 NOTE — Progress Notes (Signed)
BP 99/53 HR 84, Lasix 80 mg IV hold at 2200 tonight per MD order.

## 2016-09-08 NOTE — Progress Notes (Signed)
PROGRESS NOTE [facility]   Harold AxeMark C Mordecai   RUE:[id number]:[id number] DOB: 1950/08/16  DOA: 11-19-2016 PCP: Shirlean MylarWebb, Carol, MD   Brief Narrative:  [name] is a 66 year old male with medical history significant for chronic A. 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 Prior to admission patient was treated for pneumonia with Levaquin but despite treatment patient became confused and hypoxic with saturations into the low 80s. In the ED patient was found to be hypoxemic and hypercarbic, BiPAP was ordered but patient did not tolerate critical care evaluation patient and deemed that no intervention was necessary. Patient was placed on high flow oxygen, admitted to step down unit and placed on broad-spectrum antibiotics.  Subjective: Patient seen and examined, doing well, on CXR from yesterday, left lung is collapse. Per nursing staff and patient is breathing is ok. Continues to diurese very well, I&O's negative ~ 4L overnight and weight is down 13 lb since admission.    Assessment & Plan: Acute respiratory failure with hypercarbia and hypoxia - likely multifactorial due to uncompensated OSA, CAP and acute CHF  Failed outpatient treatment for pneumonia.  Continue IV Zosyn for now, vancomycin discontinued as MRSA was negative, however patient with history of MRSA skin infection, low threshold to restart vancomycin if patient does not improve. Patient is intolerant to CPAP and BiPAP given claustrophobia - but willing to try  Wean O2 as tolerated to keep O2 above 88% Pulmonary recommendations appreciated Continue Pulmicort nebulizer twice a day and DuoNeb 4 times a day We'll keep in stepdown for now.  Left lung collapse - likely mucus plug, poor mobility due to paraplegia Will order BiPAP - patient willing to try  Incentive spirometry    Continue O2 supplement as needed   PNA - CAP failed outpatient treatment with Levaquin  Abx as above Follow up cultures no growth thus far Procalcitonin negative - will de-escalate to Augmentin to complete 7 days of treatment  Follow up legionella antigen   Acute on chronic systolic and diastolic CHF  BNP mild elevated but not accurate given morbid obesity, On exam diffuse crackles and LE edema  ECHO showed EF 40-45% down from 50-55%  IV lasix increase today  Continue Aldactone  Daily weight, Low salt diet  Monitor I&Os  HTN - BP above goal, although questionable accuracy   Coreg was increased Will increase Cardizem as HR is above goal as well  Continue Losartan  Hydralazine PRN, If BP does not improve will schedule hydralazine  Monitor BP   Chronic Afib  HR above goal  Patient on Eliquis  Increase Cardizem  Continue Coreg   Paraplegia due to spinal cord injury w/ chronic pain - stable  Continue current regimen  Pain meds adjusted, pain well controlled  - patient was taking Tylenol # 4 3 tabs 4x a day at home changed to Oxycontin 20 mg BID with good control   Morbid obesity  Follow up as outpatient   DVT prophylaxis: Eliquis  Code Status: FULL  Family Communication: None at bedside  Disposition Plan: Home when medically stable, keep in SDU for now as patient is high risk for intubation  Consultants:   PCCM   Procedures:   None   Antimicrobials: Anti-infectives    Start     Dose/Rate Route Frequency Ordered Stop   09/06/16 0400  vancomycin (VANCOCIN) 1,250 mg in sodium  chloride 0.9 % 250 mL IVPB  Status:  Discontinued     1,250 mg 166.7 mL/hr over 90 Minutes Intravenous Every 12 hours 09/01/2016 1926 09/06/16 0854   09/06/16 0000  piperacillin-tazobactam (ZOSYN) IVPB 3.375 g     3.375 g 12.5 mL/hr over 240 Minutes Intravenous Every 8 hours 08/09/2016 1625     08/21/2016 1630  vancomycin (VANCOCIN) IVPB 1000 mg/200 mL premix     1,000 mg 200 mL/hr over 60 Minutes  Intravenous  Once 08/07/2016 1625 08/23/2016 1813   08/16/2016 1445  piperacillin-tazobactam (ZOSYN) IVPB 3.375 g     3.375 g 100 mL/hr over 30 Minutes Intravenous  Once 08/11/2016 1437 08/19/2016 1605   08/10/2016 1445  vancomycin (VANCOCIN) IVPB 1000 mg/200 mL premix     1,000 mg 200 mL/hr over 60 Minutes Intravenous  Once 08/11/2016 1437 08/16/2016 1705       Objective: Vitals:   09/08/16 0000 09/08/16 0300 09/08/16 0600 09/08/16 0757  BP: (!) 171/110     Pulse:      Resp: (!) 24     Temp:  97.9 F (36.6 C)    TempSrc:  Oral    SpO2: 95%   93%  Weight:   (!) 164.7 kg (363 lb)   Height:        Intake/Output Summary (Last 24 hours) at 09/08/16 0823 Last data filed at 09/08/16 0600  Gross per 24 hour  Intake           480.83 ml  Output             5300 ml  Net         -4819.17 ml   Filed Weights   08/12/2016 1454 08/11/2016 2132 09/08/16 0600  Weight: (!) 158.8 kg (350 lb) (!) 170.7 kg (376 lb 5.2 oz) (!) 164.7 kg (363 lb)    Examination:  General: Pt is alert, awake, not in acute distress Cardiovascular: Irregular, S1/S2 +, no rubs, no gallops Respiratory: Anterior left side no breath sounds, R side aeration improved  Abdominal:  Obese soft, NT, ND, bowel sounds + Extremities: LE pitting edema bilaterally, no cyanosis  Data Reviewed: I have personally reviewed following labs and imaging studies  CBC:  Recent Labs Lab 08/08/2016 1358 09/06/16 0518 09/07/16 0709 09/08/16 0346  WBC 15.3* 12.7* 15.2* 14.0*  NEUTROABS  --   --  12.2* 10.6*  HGB 13.1 12.5* 11.9* 13.2  HCT 44.9 42.8 38.6* 41.3  MCV 102.0* 102.1* 97.7 95.2  PLT 139* 117* 143* 144*   Basic Metabolic Panel:  Recent Labs Lab 08/11/2016 1358 09/06/16 0518 09/07/16 0709 09/08/16 0346  NA 141 140 134* 134*  K 4.2 5.1 4.9 4.3  CL 82* 85* 83* 80*  CO2 49* 46* 43* 43*  GLUCOSE 132* 126* 147* 130*  BUN 18 18 14 11   CREATININE 0.35* 0.34* 0.38* 0.32*  CALCIUM 9.0 8.6* 8.5* 8.9  MG  --   --   --  2.0  PHOS  --    --   --  4.2   Sepsis Labs:  Recent Labs Lab 08/22/2016 1525 08/26/2016 1922 09/06/16 0920 09/07/16 0709 09/08/16 0346  PROCALCITON  --   --  0.14 <0.10 <0.10  LATICACIDVEN 1.25 0.82  --   --   --     Recent Results (from the past 240 hour(s))  Blood Culture (routine x 2)     Status: None (Preliminary result)   Collection Time: 08/25/2016  2:37 PM  Result Value  Ref Range Status   Specimen Description RIGHT ANTECUBITAL  Final   Special Requests   Final    BOTTLES DRAWN AEROBIC AND ANAEROBIC Blood Culture adequate volume   Culture   Final    NO GROWTH 2 DAYS Performed at [facility], [address], [place], [place] [address]    Report Status PENDING  Incomplete  Blood Culture (routine x 2)     Status: None (Preliminary result)   Collection Time: 09/16/2016  3:15 PM  Result Value Ref Range Status   Specimen Description RIGHT ANTECUBITAL  Final   Special Requests   Final    BOTTLES DRAWN AEROBIC AND ANAEROBIC Blood Culture adequate volume   Culture   Final    NO GROWTH 2 DAYS Performed at [facility], [address], [place], [place] [address]    Report Status PENDING  Incomplete  Culture, sputum-assessment     Status: None   Collection Time: 2016/09/16 10:26 PM  Result Value Ref Range Status   Specimen Description SPU  Final   Special Requests NONE  Final   Sputum evaluation THIS SPECIMEN IS ACCEPTABLE FOR SPUTUM CULTURE  Final   Report Status 09/16/16 FINAL  Final  Culture, respiratory (NON-Expectorated)     Status: None (Preliminary result)   Collection Time: 2016/09/16 10:26 PM  Result Value Ref Range Status   Specimen Description SPU  Final   Special Requests NONE Reflexed from [id number]  Final   Gram Stain   Final    MODERATE WBC PRESENT, PREDOMINANTLY PMN FEW GRAM POSITIVE COCCI RARE GRAM POSITIVE RODS    Culture   Final    CULTURE REINCUBATED FOR BETTER GROWTH Performed at [facility], [address], [place], [place] [address]    Report Status  PENDING  Incomplete  MRSA PCR Screening     Status: None   Collection Time: 2016/09/16 10:56 PM  Result Value Ref Range Status   MRSA by PCR NEGATIVE NEGATIVE Final    Comment:        The GeneXpert MRSA Assay (FDA approved for NASAL specimens only), is one component of a comprehensive MRSA colonization surveillance program. It is not intended to diagnose MRSA infection nor to guide or monitor treatment for MRSA infections.      Radiology Studies: Dg Chest Port 1 View  Result Date: 09/07/2016 CLINICAL DATA:  Cough and shortness of breath EXAM: PORTABLE CHEST 1 VIEW COMPARISON:  09/16/2016 FINDINGS: Near complete whiteout of the left hemithorax noted. There is some apparent associated left hemithoracic volume loss. Apparent progression of large left pleural effusion. Cardiopericardial silhouette obscured. Vascular congestion noted right lung without right pleural effusion. The visualized bony structures of the thorax are intact. Telemetry leads overlie the chest. IMPRESSION: Interval progression of left lung collapse/ consolidation with pleural effusion. Electronically Signed   By: Kennith Center M.D.   On: 09/07/2016 09:39    Scheduled Meds: . acidophilus  1 capsule Oral Q breakfast  . apixaban  5 mg Oral BID  . aspirin  325 mg Oral Q breakfast  . carvedilol  25 mg Oral BID WC  . diltiazem  120 mg Oral Q breakfast  . feeding supplement (ENSURE ENLIVE)  237 mL Oral BID BM  . furosemide  80 mg Intravenous TID  . ipratropium-albuterol  3 mL Nebulization QID  . loratadine  10 mg Oral Daily  . losartan  100 mg Oral Q breakfast  . oxyCODONE  20 mg Oral Q12H  .  spironolactone  25 mg Oral Daily   Continuous Infusions: . sodium chloride Stopped (09/07/16 0805)  . piperacillin-tazobactam (ZOSYN)  IV 3.375 g (09/08/16 9604)     LOS: 3 days    Latrelle Dodrill, MD Pager: Text Page via [email or web address]  [phone]  If 7PM-7AM, please contact night-coverage [email or web address] Password  [facility] 09/08/2016, 8:23 AM

## 2016-09-08 NOTE — Progress Notes (Signed)
Attempted to place pt on bipap per dr Courtney ParisSilvas order. Pt was agreeable to try. He was unable to tolerate the mask due to claustrophobia. Left bipap at pts bedside. Pt is currently on 5L nasal cannula.

## 2016-09-09 ENCOUNTER — Inpatient Hospital Stay (HOSPITAL_COMMUNITY): Payer: 59

## 2016-09-09 DIAGNOSIS — J9811 Atelectasis: Secondary | ICD-10-CM

## 2016-09-09 LAB — BLOOD GAS, ARTERIAL
ACID-BASE EXCESS: 17.8 mmol/L — AB (ref 0.0–2.0)
Acid-Base Excess: 13 mmol/L — ABNORMAL HIGH (ref 0.0–2.0)
Acid-Base Excess: 18.4 mmol/L — ABNORMAL HIGH (ref 0.0–2.0)
BICARBONATE: 44.5 mmol/L — AB (ref 20.0–28.0)
Bicarbonate: 44.6 mmol/L — ABNORMAL HIGH (ref 20.0–28.0)
Bicarbonate: 43.5 mmol/L — ABNORMAL HIGH (ref 20.0–28.0)
DRAWN BY: 225631
Drawn by: 441261
Drawn by: 441261
FIO2: 70
FIO2: 70
LHR: 16 {breaths}/min
MECHVT: 0.6 mL
O2 Content: 7 L/min
O2 Saturation: 76 %
O2 Saturation: 97.8 %
O2 Saturation: 91.8 %
PATIENT TEMPERATURE: 99.7
PEEP/CPAP: 8 cmH2O
PEEP/CPAP: 5 cmH2O
PO2 ART: 54.6 mmHg — AB (ref 83.0–108.0)
Patient temperature: 98.6
Patient temperature: 98.6
RATE: 14 resp/min
VT: 600 mL
pCO2 arterial: 116 mmHg (ref 32.0–48.0)
pCO2 arterial: 61.6 mmHg — ABNORMAL HIGH (ref 32.0–48.0)
pCO2 arterial: 48.2 mmHg — ABNORMAL HIGH (ref 32.0–48.0)
pH, Arterial: 7.208 — ABNORMAL LOW (ref 7.350–7.450)
pH, Arterial: 7.472 — ABNORMAL HIGH (ref 7.350–7.450)
pH, Arterial: 7.566 — ABNORMAL HIGH (ref 7.350–7.450)
pO2, Arterial: 49 mmHg — ABNORMAL LOW (ref 83.0–108.0)
pO2, Arterial: 88.6 mmHg (ref 83.0–108.0)

## 2016-09-09 LAB — BASIC METABOLIC PANEL WITH GFR
Anion gap: 6 (ref 5–15)
BUN: 20 mg/dL (ref 6–20)
CO2: 45 mmol/L — ABNORMAL HIGH (ref 22–32)
Calcium: 8.9 mg/dL (ref 8.9–10.3)
Chloride: 84 mmol/L — ABNORMAL LOW (ref 101–111)
Creatinine, Ser: 0.57 mg/dL — ABNORMAL LOW (ref 0.61–1.24)
GFR calc Af Amer: >60 mL/min
GFR calc non Af Amer: >60 mL/min
Glucose, Bld: 111 mg/dL — ABNORMAL HIGH (ref 65–99)
Potassium: 5.5 mmol/L — ABNORMAL HIGH (ref 3.5–5.1)
Sodium: 135 mmol/L (ref 135–145)

## 2016-09-09 LAB — LEGIONELLA PNEUMOPHILA SEROGP 1 UR AG: L. PNEUMOPHILA SEROGP 1 UR AG: NEGATIVE

## 2016-09-09 LAB — PHOSPHORUS: Phosphorus: 6.5 mg/dL — ABNORMAL HIGH (ref 2.5–4.6)

## 2016-09-09 LAB — GLUCOSE, CAPILLARY: GLUCOSE-CAPILLARY: 118 mg/dL — AB (ref 65–99)

## 2016-09-09 LAB — MAGNESIUM: Magnesium: 2.1 mg/dL (ref 1.7–2.4)

## 2016-09-09 LAB — TRIGLYCERIDES: Triglycerides: 128 mg/dL (ref <150)

## 2016-09-09 MED ORDER — FENTANYL CITRATE (PF) 100 MCG/2ML IJ SOLN
INTRAMUSCULAR | Status: AC
  Filled 2016-09-09: qty 2

## 2016-09-09 MED ORDER — SODIUM CHLORIDE 0.9 % IV SOLN: 75.0000 mg/kg | Freq: Three times a day (TID) | INTRAVENOUS | Status: DC

## 2016-09-09 MED ORDER — ASPIRIN 81 MG PO CHEW
81.0000 mg | CHEWABLE_TABLET | Freq: Every day | ORAL | Status: DC
  Administered 2016-09-10 – 2016-09-15 (×6): 81 mg
  Filled 2016-09-09 (×6): qty 1

## 2016-09-09 MED ORDER — PHENYLEPHRINE HCL-NACL 10-0.9 MG/250ML-% IV SOLN
0.0000 ug/min | INTRAVENOUS | Status: DC
  Administered 2016-09-09 (×2): 50 ug/min via INTRAVENOUS
  Administered 2016-09-09: 135 ug/min via INTRAVENOUS
  Administered 2016-09-09: 20 ug/min via INTRAVENOUS
  Administered 2016-09-10: 90 ug/min via INTRAVENOUS
  Administered 2016-09-10 (×3): 55 ug/min via INTRAVENOUS
  Administered 2016-09-10: 40 ug/min via INTRAVENOUS
  Administered 2016-09-10: 90 ug/min via INTRAVENOUS
  Administered 2016-09-10: 45 ug/min via INTRAVENOUS
  Administered 2016-09-10: 65 ug/min via INTRAVENOUS
  Administered 2016-09-11 (×3): 90 ug/min via INTRAVENOUS
  Filled 2016-09-09 (×14): qty 250

## 2016-09-09 MED ORDER — SODIUM CHLORIDE 0.9 % IV SOLN
25.0000 ug/h | INTRAVENOUS | Status: DC
  Administered 2016-09-09: 50 ug/h via INTRAVENOUS
  Administered 2016-09-10: 200 ug/h via INTRAVENOUS
  Administered 2016-09-10 – 2016-09-12 (×5): 250 ug/h via INTRAVENOUS
  Filled 2016-09-09 (×9): qty 50

## 2016-09-09 MED ORDER — PROPOFOL 1000 MG/100ML IV EMUL
INTRAVENOUS | Status: AC
  Filled 2016-09-09: qty 100

## 2016-09-09 MED ORDER — CHLORHEXIDINE GLUCONATE 0.12% ORAL RINSE (MEDLINE KIT)
15.0000 mL | Freq: Two times a day (BID) | OROMUCOSAL | Status: DC
  Administered 2016-09-09 – 2016-09-15 (×13): 15 mL via OROMUCOSAL

## 2016-09-09 MED ORDER — ACETYLCYSTEINE 20 % IN SOLN
RESPIRATORY_TRACT | Status: AC
  Filled 2016-09-09: qty 4

## 2016-09-09 MED ORDER — PIPERACILLIN-TAZOBACTAM 3.375 G IVPB
3.3750 g | Freq: Three times a day (TID) | INTRAVENOUS | Status: DC
  Administered 2016-09-09 – 2016-09-15 (×18): 3.375 g via INTRAVENOUS
  Filled 2016-09-09 (×18): qty 50

## 2016-09-09 MED ORDER — APIXABAN 5 MG PO TABS
5.0000 mg | ORAL_TABLET | Freq: Two times a day (BID) | ORAL | Status: DC
  Administered 2016-09-09 – 2016-09-17 (×17): 5 mg
  Filled 2016-09-09 (×17): qty 1

## 2016-09-09 MED ORDER — MIDAZOLAM HCL 2 MG/2ML IJ SOLN
INTRAMUSCULAR | Status: AC
  Administered 2016-09-09: 2 mg
  Filled 2016-09-09: qty 2

## 2016-09-09 MED ORDER — ASPIRIN 81 MG PO CHEW: 81.0000 mg | CHEWABLE_TABLET | Freq: Once | ORAL | Status: DC

## 2016-09-09 MED ORDER — MIDAZOLAM HCL 2 MG/2ML IJ SOLN
1.0000 mg | INTRAMUSCULAR | Status: AC | PRN
  Administered 2016-09-09 – 2016-09-10 (×3): 1 mg via INTRAVENOUS
  Filled 2016-09-09 (×2): qty 2

## 2016-09-09 MED ORDER — MIDAZOLAM HCL 2 MG/2ML IJ SOLN
1.0000 mg | INTRAMUSCULAR | Status: DC | PRN
  Administered 2016-09-10 – 2016-09-11 (×4): 1 mg via INTRAVENOUS
  Filled 2016-09-09 (×6): qty 2

## 2016-09-09 MED ORDER — AMOXICILLIN-POT CLAVULANATE 875-125 MG PO TABS: 1.0000 | ORAL_TABLET | Freq: Two times a day (BID) | ORAL | Status: DC

## 2016-09-09 MED ORDER — FENTANYL CITRATE (PF) 100 MCG/2ML IJ SOLN
INTRAMUSCULAR | Status: AC
  Administered 2016-09-09: 50 ug
  Filled 2016-09-09: qty 2

## 2016-09-09 MED ORDER — ORAL CARE MOUTH RINSE
15.0000 mL | Freq: Four times a day (QID) | OROMUCOSAL | Status: DC
  Administered 2016-09-09 – 2016-09-15 (×25): 15 mL via OROMUCOSAL

## 2016-09-09 MED ORDER — PROPOFOL 1000 MG/100ML IV EMUL
5.0000 ug/kg/min | INTRAVENOUS | Status: DC
  Administered 2016-09-09 (×2): 30 ug/kg/min via INTRAVENOUS
  Administered 2016-09-09: 20 ug/kg/min via INTRAVENOUS
  Administered 2016-09-09: 35 ug/kg/min via INTRAVENOUS
  Administered 2016-09-10: 45 ug/kg/min via INTRAVENOUS
  Administered 2016-09-10 (×2): 40 ug/kg/min via INTRAVENOUS
  Administered 2016-09-10: 45 ug/kg/min via INTRAVENOUS
  Administered 2016-09-10 – 2016-09-11 (×10): 40 ug/kg/min via INTRAVENOUS
  Administered 2016-09-11: 35 ug/kg/min via INTRAVENOUS
  Administered 2016-09-11 (×2): 40 ug/kg/min via INTRAVENOUS
  Administered 2016-09-11: 45 ug/kg/min via INTRAVENOUS
  Administered 2016-09-12: 40 ug/kg/min via INTRAVENOUS
  Administered 2016-09-12: 45 ug/kg/min via INTRAVENOUS
  Administered 2016-09-12: 50 ug/kg/min via INTRAVENOUS
  Administered 2016-09-12: 40 ug/kg/min via INTRAVENOUS
  Filled 2016-09-09 (×27): qty 100

## 2016-09-09 MED ORDER — SODIUM CHLORIDE 0.9 % IV BOLUS (SEPSIS)
1000.0000 mL | Freq: Once | INTRAVENOUS | Status: AC
  Administered 2016-09-09: 1000 mL via INTRAVENOUS

## 2016-09-09 MED ORDER — FENTANYL BOLUS VIA INFUSION
25.0000 ug | INTRAVENOUS | Status: DC | PRN
  Administered 2016-09-09: 25 ug via INTRAVENOUS
  Administered 2016-09-09: 50 ug via INTRAVENOUS
  Administered 2016-09-09: 100 ug via INTRAVENOUS
  Administered 2016-09-12 – 2016-09-14 (×7): 25 ug via INTRAVENOUS
  Filled 2016-09-09: qty 25

## 2016-09-09 MED ORDER — ASPIRIN EC 81 MG PO TBEC
81.0000 mg | DELAYED_RELEASE_TABLET | Freq: Every day | ORAL | Status: DC
  Administered 2016-09-09: 81 mg via ORAL
  Filled 2016-09-09: qty 1

## 2016-09-09 MED ORDER — ACETYLCYSTEINE 20 % IN SOLN
4.0000 mL | Freq: Once | RESPIRATORY_TRACT | Status: AC
  Administered 2016-09-09: 4 mL via ORAL

## 2016-09-09 MED ORDER — PANTOPRAZOLE SODIUM 40 MG IV SOLR
40.0000 mg | Freq: Every day | INTRAVENOUS | Status: DC
  Administered 2016-09-09: 40 mg via INTRAVENOUS
  Filled 2016-09-09: qty 40

## 2016-09-09 MED ORDER — FENTANYL CITRATE (PF) 100 MCG/2ML IJ SOLN
50.0000 ug | Freq: Once | INTRAMUSCULAR | Status: AC
  Administered 2016-09-09: 50 ug via INTRAVENOUS

## 2016-09-09 NOTE — Progress Notes (Signed)
Pt BP reading low (69/39 (47))- BP manually checked bilaterally to a reading of 68 systolic on R and 66 systolic on L. Patient more lethargic than this morning, but can answer orientation questions appropriately and makes eye contact with verbal stimulation, but falls back asleep mid sentence. MD notified. Orders received. Will continue to monitor.

## 2016-09-09 NOTE — Procedures (Signed)
Bronchoscopy Procedure Note AARIT KASHUBA [id number] Sep 12, 1950  Procedure: Bronchoscopy Indications: Obtain specimens for culture and/or other diagnostic studies and Remove secretions  Procedure Details Consent: Risks of procedure as well as the alternatives and risks of each were explained to the (patient/caregiver).  Consent for procedure obtained. Time Out: Verified patient identification, verified procedure, site/side was marked, verified correct patient position, special equipment/implants available, medications/allergies/relevent history reviewed, required imaging and test results available.  Performed  In preparation for procedure, patient was given 100% FiO2 and bronchoscope lubricated. Sedation: Benzodiazepines and Etomidate  Airway entered and the following bronchi were examined: Bronchi.  Thick inspissated secretions noted and left mainstem bronchus blocking the tube but these were suctioned out after repeated attempts at suctioning and lavage. All the lobar bronchi could be fully visualized by the end of the procedure Procedures performed: BAL performed Bronchoscope removed.  , Patient placed back on 100% FiO2 at conclusion of procedure.    Evaluation Hemodynamic Status: BP stable throughout; O2 sats: stable throughout Patient's Current Condition: stable Specimens:  Sent purulent fluid Complications: No apparent complications Patient did tolerate procedure well.   Masen Luallen V. 09/09/2016

## 2016-09-09 NOTE — Progress Notes (Signed)
RT WL/ICU note: ABG drawn on 600cc VT/+8.0 PEEP/14-RR/.70 Fi02.

## 2016-09-09 NOTE — Procedures (Signed)
Intubation Procedure Note [name] [id number] 01-18-1951  Procedure: Intubation Indications: Respiratory insufficiency  Procedure Details Consent: Risks of procedure as well as the alternatives and risks of each were explained to the (patient/caregiver).  Consent for procedure obtained. Time Out: Verified patient identification, verified procedure, site/side was marked, verified correct patient position, special equipment/implants available, medications/allergies/relevent history reviewed, required imaging and test results available.  Performed  Maximum sterile technique was used including antiseptics, gloves, hand hygiene and mask.  MAC and 4    Evaluation Hemodynamic Status: BP stable throughout; O2 sats: stable throughout Patient's Current Condition: stable Complications: No apparent complications Patient did tolerate procedure well. Chest X-ray ordered to verify placement.  CXR: pending.   Clementeen Graham 09/09/2016  Erick Colace ACNP-BC [place] Pager # [phone] OR # [phone] if no answer

## 2016-09-09 NOTE — Progress Notes (Addendum)
Patient intubated at 1448 with 2 mg Versed, 50 mcg Fentanyl, 20 mg Etomidate, and 60 mg Rocuronium.

## 2016-09-09 NOTE — Progress Notes (Signed)
eLink Physician-Brief Progress Note Patient Name: [name] DOB: [birth year] MRN: [id number]   Date of Service  09/09/2016  HPI/Events of Note   ABG on 70%/PRVC 16/TV 600/P 8 = 7.47/61.6/88.6/44.5. Breathing at set rate of 16.  eICU Interventions  Will order: 1. Decrease PRVC rate to 14.  2. ABG at 9 PM.     Intervention Category Major Interventions: Acid-Base disturbance - evaluation and management;Respiratory failure - evaluation and management  Sommer,Steven Dennard Nipugene 09/09/2016, 7:45 PM

## 2016-09-09 NOTE — Progress Notes (Addendum)
PROGRESS NOTE [facility]   [name]   ZOX:[id number] DOB: [birth year]  DOA: 2016-09-17 PCP: Shirlean Mylar, MD   Brief Narrative:  [name] is a 66 year old male with medical history significant for chronic A. 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 Prior to admission patient was treated for pneumonia with Levaquin but despite treatment patient became confused and hypoxic with saturations into the low 80s. In the ED patient was found to be hypoxemic and hypercarbic, BiPAP was ordered but patient did not tolerate critical care evaluation patient and deemed that no intervention was necessary. Patient was placed on high flow oxygen, admitted to step down unit and placed on broad-spectrum antibiotics.  Subjective: Patient seen and examined this AM, report breathing has improved. No acute events overnight. Patient did not tolerated BiPAP. BP soft this AM    Assessment & Plan: Acute respiratory failure with hypercarbia and hypoxia - likely multifactorial due to uncompensated OSA, CAP and acute CHF  Failed outpatient treatment for pneumonia.  Patient initially treated with Vanc and Zosyn. Vanc d/ced given MRSA negative, Zosyn transitioned to Augmenting given < 0.10 procalcitonin levels  Keeps O2 sat above 88%  Pulmonary recommendations appreciated Continue Pulmicort nebulizer twice a day and DuoNeb 4 times a day Keep in SDU   Left lung collapse - likely mucus plug, poor mobility due to paraplegia Repeated CXR persistent white out of the lung   Case discussed with PCCM, recommending bronchoscopy - high risk for intubation  Continue O supplement as needed   PNA - CAP failed outpatient treatment with Levaquin  Abx as above Follow up cultures no growth thus far Procalcitonin negative - will de-escalate to Augmentin to  complete 7 days of treatment  Legionella antigen pending   Acute on chronic systolic and diastolic CHF  BNP mild elevated but not accurate given morbid obesity, On exam diffuse crackles and LE edema  ECHO showed EF 40-45% down from 50-55%  Continue IV lasix - hold of low BP  Continue Aldactone  Daily weight, Low salt diet  Monitor I&Os  HTN - Now low BP  Hold Coreg, Losartan and Cardizem  Will give 1 bolus NS Monitor BP closely   Chronic Afib  HR above goal  Patient on Eliquis  Holding medications given low BP   Paraplegia due to spinal cord injury w/ chronic pain - stable  Continue current regimen  Pain meds adjusted, pain well controlled  - patient was taking Tylenol # 4 3 tabs 4x a day at home changed to Oxycontin 20 mg BID with good control   Morbid obesity  Follow up as outpatient   Addendum - called by RN as patient is hypotensive. Advised to hold BP meds, and give 1L NS, gentle hydration as patient have 40-45% of ejection fraction. Obtain ABG. - results 7.208/116/49/76% PCCM called for intubation. Will transfer patient to critical care services.   DVT prophylaxis: Eliquis  Code Status: FULL  Family Communication: None at bedside  Disposition Plan: ICU   Consultants:   PCCM   Procedures:   None   Antimicrobials: Anti-infectives    Start     Dose/Rate Route Frequency Ordered Stop   09/08/16 1000  amoxicillin-clavulanate (AUGMENTIN) 875-125 MG per tablet 1 tablet     1 tablet Oral Every 12 hours 09/08/16 0851     09/06/16 0400  vancomycin (  VANCOCIN) 1,250 mg in sodium chloride 0.9 % 250 mL IVPB  Status:  Discontinued     1,250 mg 166.7 mL/hr over 90 Minutes Intravenous Every 12 hours 08/26/2016 1926 09/06/16 0854   09/06/16 0000  piperacillin-tazobactam (ZOSYN) IVPB 3.375 g  Status:  Discontinued     3.375 g 12.5 mL/hr over 240 Minutes Intravenous Every 8 hours 08/06/2016 1625 09/08/16 0851   08/08/2016 1630  vancomycin (VANCOCIN) IVPB 1000 mg/200 mL premix      1,000 mg 200 mL/hr over 60 Minutes Intravenous  Once 08/15/2016 1625 08/06/2016 1813   09/02/2016 1445  piperacillin-tazobactam (ZOSYN) IVPB 3.375 g     3.375 g 100 mL/hr over 30 Minutes Intravenous  Once 09/04/2016 1437 08/17/2016 1605   08/28/2016 1445  vancomycin (VANCOCIN) IVPB 1000 mg/200 mL premix     1,000 mg 200 mL/hr over 60 Minutes Intravenous  Once 08/08/2016 1437 08/16/2016 1705       Objective: Vitals:   09/09/16 0400 09/09/16 0600 09/09/16 0754 09/09/16 0800  BP: (!) 155/65 114/62  133/73  Pulse:   (!) 108   Resp: (!) 33 (!) 40 (!) 26 (!) 27  Temp:    97.6 F (36.4 C)  TempSrc:    Oral  SpO2: (!) 87% 95% 93% 97%  Weight:  (!) 165.6 kg (365 lb)    Height:        Intake/Output Summary (Last 24 hours) at 09/09/16 0850 Last data filed at 09/09/16 0400  Gross per 24 hour  Intake                0 ml  Output              850 ml  Net             -850 ml   Filed Weights   08/28/2016 2132 09/08/16 0600 09/09/16 0600  Weight: (!) 170.7 kg (376 lb 5.2 oz) (!) 164.7 kg (363 lb) (!) 165.6 kg (365 lb)    Examination:  General: Pt is alert, awake, not in acute distress Cardiovascular: RRR, S1/S2 +, no rubs, no gallops Respiratory: Air sound 90% decrease on the left, normal respiratory effort  Abdominal: Soft, NT, ND, bowel sounds + Extremities: no edema, no cyanosis  Data Reviewed: I have personally reviewed following labs and imaging studies  CBC:  Recent Labs Lab 08/12/2016 1358 09/06/16 0518 09/07/16 0709 09/08/16 0346  WBC 15.3* 12.7* 15.2* 14.0*  NEUTROABS  --   --  12.2* 10.6*  HGB 13.1 12.5* 11.9* 13.2  HCT 44.9 42.8 38.6* 41.3  MCV 102.0* 102.1* 97.7 95.2  PLT 139* 117* 143* 144*   Basic Metabolic Panel:  Recent Labs Lab 09/04/2016 1358 09/06/16 0518 09/07/16 0709 09/08/16 0346 09/09/16 0334 09/09/16 0755  NA 141 140 134* 134*  --  135  K 4.2 5.1 4.9 4.3  --  5.5*  CL 82* 85* 83* 80*  --  84*  CO2 49* 46* 43* 43*  --  45*  GLUCOSE 132* 126* 147* 130*  --   111*  BUN 18 18 14 11   --  20  CREATININE 0.35* 0.34* 0.38* 0.32*  --  0.57*  CALCIUM 9.0 8.6* 8.5* 8.9  --  8.9  MG  --   --   --  2.0 2.1  --   PHOS  --   --   --  4.2 6.5*  --    Sepsis Labs:  Recent Labs Lab 08/14/2016 1525 08/25/2016 1922 09/06/16  0920 09/07/16 0709 09/08/16 0346  PROCALCITON  --   --  0.14 <0.10 <0.10  LATICACIDVEN 1.25 0.82  --   --   --     Recent Results (from the past 240 hour(s))  Blood Culture (routine x 2)     Status: None (Preliminary result)   Collection Time: September 23, 2016  2:37 PM  Result Value Ref Range Status   Specimen Description RIGHT ANTECUBITAL  Final   Special Requests   Final    BOTTLES DRAWN AEROBIC AND ANAEROBIC Blood Culture adequate volume   Culture   Final    NO GROWTH 3 DAYS Performed at [facility], [address], [place], [place] [address]    Report Status PENDING  Incomplete  Blood Culture (routine x 2)     Status: None (Preliminary result)   Collection Time: 23-Sep-2016  3:15 PM  Result Value Ref Range Status   Specimen Description RIGHT ANTECUBITAL  Final   Special Requests   Final    BOTTLES DRAWN AEROBIC AND ANAEROBIC Blood Culture adequate volume   Culture   Final    NO GROWTH 3 DAYS Performed at [facility], [address], [place], [place] [address]    Report Status PENDING  Incomplete  Culture, sputum-assessment     Status: None   Collection Time: 09-23-2016 10:26 PM  Result Value Ref Range Status   Specimen Description SPU  Final   Special Requests NONE  Final   Sputum evaluation THIS SPECIMEN IS ACCEPTABLE FOR SPUTUM CULTURE  Final   Report Status 09/23/16 FINAL  Final  Culture, respiratory (NON-Expectorated)     Status: None   Collection Time: September 23, 2016 10:26 PM  Result Value Ref Range Status   Specimen Description SPU  Final   Special Requests NONE Reflexed from [id number]  Final   Gram Stain   Final    MODERATE WBC PRESENT, PREDOMINANTLY PMN FEW GRAM POSITIVE COCCI RARE GRAM POSITIVE RODS     Culture   Final    Consistent with normal respiratory flora. Performed at [facility], [address], [place], [place] [address]    Report Status 09/08/2016 FINAL  Final  MRSA PCR Screening     Status: None   Collection Time: 09-23-2016 10:56 PM  Result Value Ref Range Status   MRSA by PCR NEGATIVE NEGATIVE Final    Comment:        The GeneXpert MRSA Assay (FDA approved for NASAL specimens only), is one component of a comprehensive MRSA colonization surveillance program. It is not intended to diagnose MRSA infection nor to guide or monitor treatment for MRSA infections.      Radiology Studies: Dg Chest Port 1 View  Result Date: 09/08/2016 CLINICAL DATA:  Acute on chronic respiratory failure. EXAM: PORTABLE CHEST 1 VIEW COMPARISON:  09/07/2016 FINDINGS: Complete opacification of the left hemithorax is now noted. Right perihilar streaky opacities are identified. There is no evidence of pneumothorax. No other significant change noted. IMPRESSION: Complete opacification of the left hemithorax which may be a combination of effusion/ consolidation/atelectasis. Right perihilar vascular congestion/atelectasis. Electronically Signed   By: Harmon Pier M.D.   On: 09/08/2016 08:37   Dg Chest Port 1 View  Result Date: 09/07/2016 CLINICAL DATA:  Cough and shortness of breath EXAM: PORTABLE CHEST 1 VIEW COMPARISON:  09/23/2016 FINDINGS: Near complete whiteout of the left hemithorax noted. There is some apparent associated left hemithoracic volume loss. Apparent progression of large left pleural effusion. Cardiopericardial silhouette  obscured. Vascular congestion noted right lung without right pleural effusion. The visualized bony structures of the thorax are intact. Telemetry leads overlie the chest. IMPRESSION: Interval progression of left lung collapse/ consolidation with pleural effusion. Electronically Signed   By: Kennith Center M.D.   On: 09/07/2016 09:39    Scheduled Meds: . acidophilus   1 capsule Oral Q breakfast  . amoxicillin-clavulanate  1 tablet Oral Q12H  . apixaban  5 mg Oral BID  . aspirin EC  81 mg Oral Daily  . carvedilol  25 mg Oral BID WC  . diltiazem  120 mg Oral Q breakfast  . feeding supplement (ENSURE ENLIVE)  237 mL Oral BID BM  . furosemide  80 mg Intravenous TID  . ipratropium-albuterol  3 mL Nebulization QID  . loratadine  10 mg Oral Daily  . losartan  100 mg Oral Q breakfast  . oxyCODONE  20 mg Oral Q12H  . spironolactone  25 mg Oral Daily   Continuous Infusions: . sodium chloride Stopped (09/07/16 0805)     LOS: 4 days    Latrelle Dodrill, MD Pager: Text Page via [email or web address]  [phone]  If 7PM-7AM, please contact night-coverage [email or web address] Password [email or web address] 09/09/2016, 8:50 AM

## 2016-09-09 NOTE — Progress Notes (Signed)
Date:  September 09, 2016 Chart reviewed for concurrent status and case management needs. Will continue to follow patient progress. Unable to wear bipap continues on 5l o2 via [place] Discharge Planning: following for needs Expected discharge date: [id number] Marcelle SmilingRhonda Karthik Whittinghill, BSN, [place]3, [place]CCM   [phone]
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
## 2016-09-09 NOTE — Progress Notes (Addendum)
Events -called by nursing staff. Pt had become less responsive & hypotensive.  -fluid challenge initiated. ABG ordered.  ABG    Component Value Date/Time   PHART 7.208 (L) 09/09/2016 1400   PCO2ART 116 (HH) 09/09/2016 1400   PO2ART 49.0 (L) 09/09/2016 1400   HCO3 44.6 (H) 09/09/2016 1400   TCO2 24.4 02/09/2008 1359   ACIDBASEDEF 1.0 01/21/2007 1936   O2SAT 76.0 09/09/2016 1400   We attempted to try NIPPV but patient would not tolerate. Off NIPPV he would open his eyes and respond only in 1 word phrases. We opted to intubate at this point and made his wife aware.   BP (!) 99/46   Pulse 82   Temp 97.9 F (36.6 C) (Oral)   Resp (!) 23   Ht 6\' 7"  (2.007 m)   Wt (!) 365 lb (165.6 kg)   SpO2 90%   BMI 41.12 kg/m   Exam prior to intubation  Lethargic. Opens eyes and will verbalize 1 word phrase. Then closes eyes. Not cooperative w/ NIPPV. HENT: MMM, poor dentition. Pulm. Remains decreased on left. Card: RRR w/ af w/ CVR.   Currently Undergoing FOB   Impression/plan  Acute on chronic respiratory failure (hypoxic and hypercarbic) in setting of CAP and Left lung collapse d/t mucous plugging and atx.  Plan Full vent support FOB followed by PEEP therapy in effort to recruit left lung PAD protocol  Hypotension -likely exacerbated by acidosis.  Plan Holding antihypertensives and further diuresis  Neo gtt for SBP > 100  Wife updated.   My ccm time 45 minutes.   Simonne MartinetPeter E Babcock ACNP-BC [facility] Pager # [phone] OR # [phone] if no answer   Due to unresponsiveness and  ABG showing increasing hypercarbia, he was emergently intubated under my supervision. Ventilator settings were reviewed and adjusted based on chest x-ray and follow-up ABG. Sedation orders were given he had breakthrough after initial administration of medications and was placed on a fentanyl drip. Bronchoscopy was performed emergently with removal of secretions which will be sent for  cultures Antibiotics were broadened to Zosyn while awaiting cultures Updated wife in detail  Additional critical care time 35 minutes independent of  APP time.  Oretha MilchALVA,Francella Barnett V. MD

## 2016-09-10 ENCOUNTER — Inpatient Hospital Stay (HOSPITAL_COMMUNITY): Payer: 59

## 2016-09-10 ENCOUNTER — Encounter (HOSPITAL_COMMUNITY): Payer: 59

## 2016-09-10 ENCOUNTER — Other Ambulatory Visit (HOSPITAL_COMMUNITY): Payer: Self-pay | Admitting: Respiratory Therapy

## 2016-09-10 LAB — BASIC METABOLIC PANEL
Anion gap: 9 (ref 5–15)
BUN: 23 mg/dL — ABNORMAL HIGH (ref 6–20)
CO2: 40 mmol/L — AB (ref 22–32)
Calcium: 8.4 mg/dL — ABNORMAL LOW (ref 8.9–10.3)
Chloride: 87 mmol/L — ABNORMAL LOW (ref 101–111)
Creatinine, Ser: 0.56 mg/dL — ABNORMAL LOW (ref 0.61–1.24)
GFR calc Af Amer: >60 mL/min (ref >60)
GFR calc non Af Amer: >60 mL/min (ref >60)
GLUCOSE: 94 mg/dL (ref 65–99)
POTASSIUM: 3.8 mmol/L (ref 3.5–5.1)
Sodium: 136 mmol/L (ref 135–145)

## 2016-09-10 LAB — PHOSPHORUS
Phosphorus: 2 mg/dL — ABNORMAL LOW (ref 2.5–4.6)
Phosphorus: 2.6 mg/dL (ref 2.5–4.6)

## 2016-09-10 LAB — CULTURE, BLOOD (ROUTINE X 2)
Culture: NO GROWTH
Culture: NO GROWTH
SPECIAL REQUESTS: ADEQUATE
Special Requests: ADEQUATE

## 2016-09-10 LAB — TRIGLYCERIDES: Triglycerides: 171 mg/dL — ABNORMAL HIGH (ref <150)

## 2016-09-10 LAB — MAGNESIUM
MAGNESIUM: 2 mg/dL (ref 1.7–2.4)
MAGNESIUM: 1.9 mg/dL (ref 1.7–2.4)

## 2016-09-10 MED ORDER — IPRATROPIUM-ALBUTEROL 0.5-2.5 (3) MG/3ML IN SOLN
3.0000 mL | Freq: Four times a day (QID) | RESPIRATORY_TRACT | Status: DC
  Administered 2016-09-10 – 2016-09-17 (×29): 3 mL via RESPIRATORY_TRACT
  Filled 2016-09-10 (×29): qty 3

## 2016-09-10 MED ORDER — FENTANYL BOLUS VIA INFUSION
200.0000 ug | INTRAVENOUS | Status: DC | PRN
  Administered 2016-09-10 (×2): 100 ug via INTRAVENOUS
  Filled 2016-09-10: qty 200

## 2016-09-10 MED ORDER — MUPIROCIN 2 % EX OINT
1.0000 "application " | TOPICAL_OINTMENT | Freq: Two times a day (BID) | CUTANEOUS | Status: DC
  Administered 2016-09-11 – 2016-09-12 (×5): 1 via NASAL
  Filled 2016-09-10 (×2): qty 22

## 2016-09-10 MED ORDER — ACETYLCYSTEINE 20 % IN SOLN
2.0000 mL | Freq: Three times a day (TID) | RESPIRATORY_TRACT | Status: AC
  Administered 2016-09-10 – 2016-09-12 (×6): 2 mL via RESPIRATORY_TRACT
  Filled 2016-09-10 (×6): qty 4

## 2016-09-10 MED ORDER — VITAL HIGH PROTEIN PO LIQD
1000.0000 mL | ORAL | Status: DC
  Filled 2016-09-10: qty 1000

## 2016-09-10 MED ORDER — PRO-STAT SUGAR FREE PO LIQD: 30.0000 mL | Freq: Two times a day (BID) | ORAL | Status: DC

## 2016-09-10 MED ORDER — SODIUM CHLORIDE 0.9% FLUSH
10.0000 mL | Freq: Two times a day (BID) | INTRAVENOUS | Status: DC
  Administered 2016-09-10 (×2): 10 mL
  Administered 2016-09-11: 20 mL
  Administered 2016-09-11: 10 mL
  Administered 2016-09-12: 20 mL
  Administered 2016-09-12: 10 mL
  Administered 2016-09-13: 20 mL
  Administered 2016-09-13 – 2016-09-16 (×5): 10 mL
  Administered 2016-09-16: 30 mL
  Administered 2016-09-17 (×2): 10 mL

## 2016-09-10 MED ORDER — ACETYLCYSTEINE 20 % IN SOLN
4.0000 mL | Freq: Once | RESPIRATORY_TRACT | Status: AC
  Administered 2016-09-10: 4 mL via RESPIRATORY_TRACT

## 2016-09-10 MED ORDER — MIDAZOLAM HCL 2 MG/2ML IJ SOLN: 1.0000 mg | Freq: Once | INTRAMUSCULAR | Status: AC

## 2016-09-10 MED ORDER — ACETYLCYSTEINE 10 % IN SOLN
2.0000 mL | Freq: Three times a day (TID) | RESPIRATORY_TRACT | Status: DC
  Filled 2016-09-10: qty 4

## 2016-09-10 MED ORDER — SODIUM CHLORIDE 0.9% FLUSH
10.0000 mL | INTRAVENOUS | Status: DC | PRN
Start: 2016-09-10 — End: 2016-09-18
  Administered 2016-09-17: 10 mL
  Filled 2016-09-10: qty 40

## 2016-09-10 MED ORDER — PRO-STAT SUGAR FREE PO LIQD
60.0000 mL | Freq: Every day | ORAL | Status: DC
  Administered 2016-09-10 – 2016-09-12 (×9): 60 mL
  Filled 2016-09-10 (×9): qty 60

## 2016-09-10 MED ORDER — VITAL HIGH PROTEIN PO LIQD
1000.0000 mL | ORAL | Status: DC
  Administered 2016-09-10: 1000 mL
  Administered 2016-09-10 – 2016-09-11 (×15)
  Administered 2016-09-11: 1000 mL
  Filled 2016-09-10 (×3): qty 1000

## 2016-09-10 MED ORDER — CHLORHEXIDINE GLUCONATE CLOTH 2 % EX PADS
6.0000 | MEDICATED_PAD | Freq: Every day | CUTANEOUS | Status: DC
  Administered 2016-09-10 – 2016-09-12 (×2): 6 via TOPICAL

## 2016-09-10 MED ORDER — PANTOPRAZOLE SODIUM 40 MG PO PACK
40.0000 mg | PACK | Freq: Every day | ORAL | Status: DC
  Administered 2016-09-10 – 2016-09-15 (×6): 40 mg
  Filled 2016-09-10 (×6): qty 20

## 2016-09-10 MED ORDER — VANCOMYCIN HCL 10 G IV SOLR
1250.0000 mg | Freq: Two times a day (BID) | INTRAVENOUS | Status: DC
  Administered 2016-09-10 – 2016-09-12 (×4): 1250 mg via INTRAVENOUS
  Filled 2016-09-10 (×5): qty 1250

## 2016-09-10 MED ORDER — ADULT MULTIVITAMIN LIQUID CH
15.0000 mL | Freq: Every day | ORAL | Status: DC
  Administered 2016-09-11 – 2016-09-15 (×5): 15 mL
  Filled 2016-09-10 (×7): qty 15

## 2016-09-10 MED ORDER — MIDAZOLAM BOLUS VIA INFUSION: 1.0000 mg | Freq: Once | INTRAVENOUS | Status: DC

## 2016-09-10 MED ORDER — PRO-STAT SUGAR FREE PO LIQD
30.0000 mL | Freq: Every day | ORAL | Status: DC
  Administered 2016-09-11: 30 mL
  Filled 2016-09-10: qty 30

## 2016-09-10 NOTE — Progress Notes (Signed)
Nutrition Follow-up  DOCUMENTATION CODES:   Morbid obesity  INTERVENTION:  - Will order Vital High Protein @ 20 mL/hr with 60 mL Prostat x5/day and 30 mL Prostat once/day (11 packets total). This regimen + kcal from current Propofol rate will provide 2628 kcal (105% maximum estimated kcal need), 207 grams of protein (83% estimated protein need), and 401 mL free water. - Will order 15 mL liquid multivitamin/day per OGT. - Free water flush per MD/NP, if desired. - RD will monitor Propofol rate and adjust TF regimen accordingly.   NUTRITION DIAGNOSIS:   Increased nutrient needs related to acute illness, wound healing as evidenced by estimated needs. -ongoing; also with pt now on the vent.   GOAL:   Provide needs based on ASPEN/SCCM guidelines -unmet with TF not yet initiated.   MONITOR:   Vent status, TF tolerance, Weight trends, Labs, Skin, I & O's  REASON FOR ASSESSMENT:   Ventilator, Consult Enteral/tube feeding initiation and management  ASSESSMENT:   66 yo Male with PMH of  atrial fibrillation, diastolic congestive heart failure, obstructive sleep apnea noncompliant with CPAP, and paraplegia following spinal cord injury at the T8 level. He is morbidly obese and was seen in the pulmonary clinic in 2016 where a sleep study found an AHI of 25.1/hr of sleep. He was ordered CPAP, but was unable to get it for insurance reasons. He was recently treated as an outpatient for pneumonia with a course of Levaquin.5/29 he began to have increasing confusion until 5/30 when he became hypoxic. His wife contacted his PCP who recommended he present to the emergency department for further evaluation.  6/5 Pt became less responsive yesterday afternoon with hypoxia and hypercarbia and pt was emergently intubated 6/4 at ~1500. OGT placed at that time and TF consult received this AM. Talked with RN and Dr. Vassie LollAlva who report plan for bronch this AM and will initiate TF after this procedure. Estimated  nutrition needs updated and based on ventilation, ASPEN/SCCM guidelines and with respect to wounds. Weight has been fairly stable over the past 2 days but will monitor closely.  Patient is currently intubated on ventilator support MV: 8.9 L/min Temp (24hrs), Avg:98.7 F (37.1 C), Min:97.9 F (36.6 C), Max:99.7 F (37.6 C) Propofol: 39.7 ml/hr (1048 kcal from fat) BP: 130/52 and MAP: 74  Medications reviewed; 40 mg Protonix per OGT/day. Labs reviewed; Cl: 87 mmol/L, BUN: 23 mg/dL, creatinine: 8.290.56 mg/dL, Ca: 8.4 mg/dL, Phos: 2 mg/dL.  Drips: Propofol @ 40 mcg/kg/min, Neo @ 55 mcg/min, Fentanyl @ 250 mcg/hr.    6/1 - Pt admitted with acute respiratory failure, bacterial PNA. - Complaining he hasn't had anything to eat.  Wife at bedside.   - Wife states pt has been trying to eat a "high protein" diet given his leg wounds. - Has some trouble chewing tough foods and/or meats. - No unintentional weight loss reported. - Nutrition focused physical exam completed to upper body; no muscle or subcutaneous fat depletion noticed and N/A to lower body given spinal cord injury.   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  Estimated Nutritional Needs:   Kcal:  2200-2500 (22-25 kcal/kg IBW)  Protein:  250 grams (2.5 grams/kg IBW)  Fluid:  2.3-2.5 L  EDUCATION NEEDS:   No education needs identified at this time    Trenton Gammon, MS, RD, LDN, CNSC Inpatient Clinical Dietitian Pager # [phone] After hours/weekend pager # [phone]

## 2016-09-10 NOTE — Procedures (Signed)
Bronchoscopy Procedure Note [name] [id number] 03/13/1951  Procedure: Bronchoscopy Indications: Diagnostic evaluation of the airways and Remove secretions  Procedure Details Consent: Risks of procedure as well as the alternatives and risks of each were explained to the (patient/caregiver).  Consent for procedure obtained. Time Out: Verified patient identification, verified procedure, site/side was marked, verified correct patient position, special equipment/implants available, medications/allergies/relevent history reviewed, required imaging and test results available.  Performed  Repeat bronchoscopy performed since left lung remained atelectatic inspite of procedure on 6/4 & positive pressure  In preparation for procedure, patient was given 100% FiO2 and bronchoscope lubricated. Sedation: Benzodiazepines versed 2 mg & fent 100 mcg  Airway entered and the following bronchi were examined: Bronchi.   Procedures performed: thick mucus suctioned from left m ain stem & peripheral bronchi with lavage Bronchoscope removed.  , Patient placed back on 100% FiO2 at conclusion of procedure.    Evaluation Hemodynamic Status: Transient hypotension resolved spontaneously; O2 sats: stable throughout Patient's Current Condition: stable Specimens:  None Complications: No apparent complications Patient did tolerate procedure well.   ALVA,RAKESH V. 09/10/2016
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
## 2016-09-10 NOTE — Progress Notes (Signed)
Pharmacy Antibiotic Note  [name] is a 66 y.o. quadriplegic male presented to the ED on 08/06/2016 with SOB.  Vancomycin and zosyn were started on admission for PNA.  Abx de-escalated to zosyn on 6/1.  To resume vancomycin back on 09/10/16 for MRSA UTI coverage.  Today, 09/10/2016: -  afeb, wbc elevated but down -  scr low - quadriplegic (crcl~92 N, rounded scr to 0.8) -  PCT <0.10   Plan: - resume vancomycin 1250 mg IV q12h - check vancomycin level at steady state - continue zosyn 3.375 gm IV q8h (infuse over 4 hrs) per MD  __________________________________  Height: 6' 7.02" (200.7 cm) Weight: (!) 366 lb (166 kg) IBW/kg (Calculated) : 93.74  Temp (24hrs), Avg:98.9 F (37.2 C), Min:98.1 F (36.7 C), Max:99.7 F (37.6 C)   Recent Labs Lab 08/08/2016 1358 08/10/2016 1525 08/17/2016 1922 09/06/16 0518 09/07/16 0709 09/08/16 0346 09/09/16 0755 09/10/16 0312  WBC 15.3*  --   --  12.7* 15.2* 14.0*  --   --   CREATININE 0.35*  --   --  0.34* 0.38* 0.32* 0.57* 0.56*  LATICACIDVEN  --  1.25 0.82  --   --   --   --   --     Estimated Creatinine Clearance: 157.5 mL/min (A) (by C-G formula based on SCr of 0.56 mg/dL (L)).    Allergies  Allergen Reactions  . Levaquin [Levofloxacin]     Can not take while on Zanaflex   . Ciprofloxacin     Unknown-Noted on list  . Clonidine Derivatives     Unknown-Noted on list  . Darvocet [Propoxyphene N-Acetaminophen]     Unknown-Noted on list  . Decadron [Dexamethasone]     Unknown-Noted on list  . Lipitor [Atorvastatin Calcium]     Unknown-Noted on list  . Lisinopril     Unknown-Noted on list  . Methylprednisolone     Unknown-Noted on list  . Norvasc [Amlodipine Besylate]     Unknown-Noted on list  . Prednisolone     Unknown-Noted on list  . Sectral [Acebutolol Hcl]     Unknown-Noted on list  . Suprax [Cefixime]     Unknown-Noted on list    Antimicrobials this admission: 5/31 Vanc >> 6/1>> resumed 6/5 (? MRSA UTI)>> 5/31 Zosyn  >> 6/3>> resumed 6/4>> 6/3 Augmentin >>6/4  Dose adjustments this admission: --  Microbiology results: 5/31 MRSA PCR: neg 5/31: sputum: few GPC, rare GPR 5/31 Bcx x2: ngtd 5/31 Strep Ag: neg 6/4 TA: rare GPC in pairs 6/5 ucx:   Thank you for allowing pharmacy to be a part of this patient's care.  Lucia Gaskinsham, Lyndsay Talamante P 09/10/2016 1:08 PM

## 2016-09-10 NOTE — Progress Notes (Signed)
Peripherally Inserted Central Catheter/Midline Placement  The IV Nurse has discussed with the patient and/or persons authorized to consent for the patient, the purpose of this procedure and the potential benefits and risks involved with this procedure.  The benefits include less needle sticks, lab draws from the catheter, and the patient may be discharged home with the catheter. Risks include, but not limited to, infection, bleeding, blood clot (thrombus formation), and puncture of an artery; nerve damage and irregular heartbeat and possibility to perform a PICC exchange if needed/ordered by physician.  Alternatives to this procedure were also discussed.  Bard Power PICC patient education guide, fact sheet on infection prevention and patient information card has been provided to patient /or left at bedside.    PICC/Midline Placement Documentation     Wife at bedside   Timmothy Soursewman, Loomis Anacker Renee 09/10/2016, 12:10 PM

## 2016-09-11 ENCOUNTER — Inpatient Hospital Stay (HOSPITAL_COMMUNITY): Payer: 59

## 2016-09-11 ENCOUNTER — Inpatient Hospital Stay (HOSPITAL_COMMUNITY): Payer: 59 | Admitting: Certified Registered Nurse Anesthetist

## 2016-09-11 DIAGNOSIS — G8220 Paraplegia, unspecified: Secondary | ICD-10-CM

## 2016-09-11 LAB — BASIC METABOLIC PANEL
ANION GAP: 7 (ref 5–15)
BUN: 23 mg/dL — ABNORMAL HIGH (ref 6–20)
CHLORIDE: 98 mmol/L — AB (ref 101–111)
CO2: 36 mmol/L — ABNORMAL HIGH (ref 22–32)
Calcium: 8.1 mg/dL — ABNORMAL LOW (ref 8.9–10.3)
Creatinine, Ser: 0.33 mg/dL — ABNORMAL LOW (ref 0.61–1.24)
Glucose, Bld: 118 mg/dL — ABNORMAL HIGH (ref 65–99)
POTASSIUM: 3.9 mmol/L (ref 3.5–5.1)
SODIUM: 141 mmol/L (ref 135–145)

## 2016-09-11 LAB — BLOOD GAS, ARTERIAL
Acid-Base Excess: 11.6 mmol/L — ABNORMAL HIGH (ref 0.0–2.0)
Bicarbonate: 37.9 mmol/L — ABNORMAL HIGH (ref 20.0–28.0)
Drawn by: 441261
FIO2: 60
LHR: 12 {breaths}/min
MECHVT: 600 mL
O2 Saturation: 95.9 %
PEEP: 10 cmH2O
PO2 ART: 84.5 mmHg (ref 83.0–108.0)
Patient temperature: 98.6
pCO2 arterial: 61.4 mmHg — ABNORMAL HIGH (ref 32.0–48.0)
pH, Arterial: 7.407 (ref 7.350–7.450)

## 2016-09-11 LAB — CBC
HCT: 32 % — ABNORMAL LOW (ref 39.0–52.0)
Hemoglobin: 10 g/dL — ABNORMAL LOW (ref 13.0–17.0)
MCH: 29.8 pg (ref 26.0–34.0)
MCHC: 31.3 g/dL (ref 30.0–36.0)
MCV: 95.2 fL (ref 78.0–100.0)
PLATELETS: 121 10*3/uL — AB (ref 150–400)
RBC: 3.36 MIL/uL — ABNORMAL LOW (ref 4.22–5.81)
RDW: 14.2 % (ref 11.5–15.5)
WBC: 10.6 10*3/uL — ABNORMAL HIGH (ref 4.0–10.5)

## 2016-09-11 LAB — COMPREHENSIVE METABOLIC PANEL
ALT: 13 U/L — ABNORMAL LOW (ref 17–63)
ANION GAP: 7 (ref 5–15)
AST: 13 U/L — ABNORMAL LOW (ref 15–41)
Albumin: 2.7 g/dL — ABNORMAL LOW (ref 3.5–5.0)
Alkaline Phosphatase: 60 U/L (ref 38–126)
BUN: 16 mg/dL (ref 6–20)
CHLORIDE: 93 mmol/L — AB (ref 101–111)
CO2: 38 mmol/L — ABNORMAL HIGH (ref 22–32)
Calcium: 7.9 mg/dL — ABNORMAL LOW (ref 8.9–10.3)
Creatinine, Ser: 0.36 mg/dL — ABNORMAL LOW (ref 0.61–1.24)
Glucose, Bld: 103 mg/dL — ABNORMAL HIGH (ref 65–99)
POTASSIUM: 2.9 mmol/L — AB (ref 3.5–5.1)
Sodium: 138 mmol/L (ref 135–145)
TOTAL PROTEIN: 5.2 g/dL — AB (ref 6.5–8.1)
Total Bilirubin: 1 mg/dL (ref 0.3–1.2)

## 2016-09-11 LAB — GLUCOSE, CAPILLARY
GLUCOSE-CAPILLARY: 116 mg/dL — AB (ref 65–99)
GLUCOSE-CAPILLARY: 111 mg/dL — AB (ref 65–99)
Glucose-Capillary: 125 mg/dL — ABNORMAL HIGH (ref 65–99)
Glucose-Capillary: 122 mg/dL — ABNORMAL HIGH (ref 65–99)
Glucose-Capillary: 123 mg/dL — ABNORMAL HIGH (ref 65–99)

## 2016-09-11 LAB — URINE CULTURE: Culture: NO GROWTH

## 2016-09-11 LAB — PHOSPHORUS
Phosphorus: 3.3 mg/dL (ref 2.5–4.6)
Phosphorus: 2.9 mg/dL (ref 2.5–4.6)

## 2016-09-11 LAB — MAGNESIUM
MAGNESIUM: 1.9 mg/dL (ref 1.7–2.4)
Magnesium: 2 mg/dL (ref 1.7–2.4)

## 2016-09-11 MED ORDER — SUCCINYLCHOLINE CHLORIDE 20 MG/ML IJ SOLN
INTRAMUSCULAR | Status: DC | PRN
  Administered 2016-09-11: 140 mg via INTRAVENOUS

## 2016-09-11 MED ORDER — PROPOFOL 10 MG/ML IV BOLUS
INTRAVENOUS | Status: AC
  Filled 2016-09-11: qty 20

## 2016-09-11 MED ORDER — PROPOFOL 10 MG/ML IV BOLUS
INTRAVENOUS | Status: DC | PRN
  Administered 2016-09-11: 150 mg via INTRAVENOUS

## 2016-09-11 MED ORDER — POTASSIUM CHLORIDE 20 MEQ/15ML (10%) PO SOLN
40.0000 meq | Freq: Once | ORAL | Status: AC
  Administered 2016-09-11: 40 meq
  Filled 2016-09-11: qty 30

## 2016-09-11 MED ORDER — SODIUM CHLORIDE 0.9 % IV SOLN
0.0000 ug/min | INTRAVENOUS | Status: DC
  Administered 2016-09-11: 70 ug/min via INTRAVENOUS
  Administered 2016-09-11 (×2): 90 ug/min via INTRAVENOUS
  Administered 2016-09-12: 250 ug/min via INTRAVENOUS
  Administered 2016-09-12: 100 ug/min via INTRAVENOUS
  Administered 2016-09-12: 60 ug/min via INTRAVENOUS
  Filled 2016-09-11 (×6): qty 4

## 2016-09-11 MED ORDER — SUCCINYLCHOLINE CHLORIDE 200 MG/10ML IV SOSY
PREFILLED_SYRINGE | INTRAVENOUS | Status: AC
  Filled 2016-09-11: qty 10

## 2016-09-11 MED ORDER — POTASSIUM CHLORIDE 20 MEQ/15ML (10%) PO SOLN
40.0000 meq | ORAL | Status: AC
  Administered 2016-09-11 (×2): 40 meq
  Filled 2016-09-11 (×2): qty 30

## 2016-09-11 MED ORDER — POTASSIUM CHLORIDE 20 MEQ/15ML (10%) PO SOLN
40.0000 meq | ORAL | Status: AC
  Administered 2016-09-11: 40 meq
  Filled 2016-09-11: qty 30

## 2016-09-11 MED ORDER — HALOPERIDOL LACTATE 5 MG/ML IJ SOLN
1.0000 mg | INTRAMUSCULAR | Status: DC | PRN
  Administered 2016-09-11: 4 mg via INTRAVENOUS
  Filled 2016-09-11: qty 1

## 2016-09-11 NOTE — Progress Notes (Signed)
eLink Physician-Brief Progress Note Patient Name: [name] DOB: Sep 24, 1950 MRN: [id number]   Date of Service  09/11/2016  HPI/Events of Note  Self extubated - Re-intubated by anesthesia. Request for bilateral soft wrist restraints.   eICU Interventions  Will order bilateral soft wrist restraints.      Intervention Category Major Interventions: Delirium, psychosis, severe agitation - evaluation and management  Sommer,Steven Eugene 09/11/2016, 3:49 PM
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
## 2016-09-11 NOTE — Anesthesia Procedure Notes (Addendum)
Procedure Name: Intubation Date/Time: 09/11/2016 3:40 PM Performed by: Wynonia SoursWALKER, Saburo Luger L Pre-anesthesia Checklist: Patient identified, Emergency Drugs available, Suction available, Patient being monitored and Timeout performed Patient Re-evaluated:Patient Re-evaluated prior to inductionOxygen Delivery Method: Ambu bag Preoxygenation: Pre-oxygenation with 100% oxygen Intubation Type: IV induction, Rapid sequence and Cricoid Pressure applied Laryngoscope Size: Glidescope and 4 Grade View: Grade I Tube type: Subglottic suction tube Tube size: 8.0 mm Number of attempts: 1 Airway Equipment and Method: Stylet Placement Confirmation: ETT inserted through vocal cords under direct vision,  CO2 detector and breath sounds checked- equal and bilateral Secured at: 27 cm Tube secured with: Tape Dental Injury: Teeth and Oropharynx as per pre-operative assessment  Comments: Glidescope 4 utilized. Vocal cords visualized and ETT placed with direct visualization. ETCO2 color change positive for tracheal intubation.

## 2016-09-11 NOTE — Progress Notes (Signed)
[facility] RE[facility]OC[facility] FOR AM LAB REPLACEMENT ONLY  The patient does apply [facility] the [facility] Electrolyte Re[facility] Protocol based on the criteria listed below:   1. Is GFR >/= 40 ml/min? Yes.    Patient's GFR today is >60 2. Is urine output >/= 0.5 ml/kg/hr for the last 6 hours? Yes.   Patient's UOP is 0.5 ml/kg/hr 3. Is BUN < 60 mg/dL? Yes.    Patient's BUN today is 16 4. Abnormal electrolyte(s): 2.9 5. Ordered repletion with: per protocol 6. If a panic level lab has been reported, has the CCM MD in charge been notified? Yes.  .   Physician:  Dr. Edson Snowballamachandran  [name], [name] 09/11/2016 5:07 AM

## 2016-09-11 NOTE — Progress Notes (Signed)
Nutrition Follow-up  DOCUMENTATION CODES:   Morbid obesity  INTERVENTION:  - Continue Vital High Protein @ 20 mL/hr with 11 packets of Prostat/day. This regimen + kcal from current Propofol rate provides 2628 kcal (105% maximum estimated kcal need), 207 grams of protein (83% estimated protein need), and 401 mL free water. - Free water flush per MD/NP, if desired. - RD will monitor Propofol rate and adjust TF regimen accordingly.   NUTRITION DIAGNOSIS:   Increased nutrient needs related to acute illness, wound healing as evidenced by estimated needs. -ongoing  GOAL:   Provide needs based on ASPEN/SCCM guidelines -met for kcal, unmet for protein at this time.   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 He is morbidly obese and was seen in the pulmonary clinic in 2016 where a sleep study found an AHI of 25.1/hr of sleep. He was ordered CPAP, but was unable to get it for insurance reasons. He was recently treated as an outpatient for pneumonia with a course of Levaquin.5/29 he began to have increasing confusion until 5/30 when he became hypoxic. His wife contacted his PCP who recommended he present to the emergency department for further evaluation.  6/6 Pt remains intubated with OGT in place. Weight stable from yesterday. Pt receiving Vital High Protein @ 20 mL/hr with 11 packets of Prostat/day. Propofol rate this AM is the same as yesterday, but RN reports plan to hopefully wean off of Propofol today. Will monitor and adjust TF regimen tomorrow. Informed RN of plan to maintain current TF regimen for the rest of today. Pt underwent bedside bronch yesterday AM.   Patient is currently intubated on ventilator support MV: 7.2 L/min Temp (24hrs), Avg:98.9 F  (37.2 C), Min:98.3 F (36.8 C), Max:99.3 F (37.4 C) Propofol: 39.7 ml/hr (1048 kcal from fat) BP: 96/52 and MAP: 65  Medications reviewed; 15 mL liquid multivitamin per OGT/day, 40 mg Protonix per OGT/day, 40 mEq KCl per OGT x1 dose today. Labs reviewed; K: 2.9 mmol/L, Cl: 93 mmol/L, creatinine: 0.36 mg/dL, Ca: 7.9 mg/dL.  Drips: Propofol @ 40 mcg/kg/min, Neo @ 60 mcg/min, Fentanyl @ 250 mcg/hr.    6/5 - Pt became less responsive yesterday afternoon with hypoxia and hypercarbia and pt was emergently intubated 6/4 at ~1500.  - OGT placed at that time and TF consult received this AM.  - Estimated nutrition needs updated and based on ventilation, ASPEN/SCCM guidelines and with respect to wounds.  - Weight has been fairly stable over the past 2 days but will monitor closely.  Patient is currently intubated on ventilator support MV: 8.9 L/min Temp (24hrs), Avg:98.7 F (37.1 C), Min:97.9 F (36.6 C), Max:99.7 F (37.6 C) Propofol: 39.7 ml/hr (1048 kcal from fat) BP: 130/52 and MAP: 74 Drips: Propofol @ 40 mcg/kg/min, Neo @ 55 mcg/min, Fentanyl @ 250 mcg/hr.    6/1 - Pt admitted with acute respiratory failure, bacterial PNA. - Complaining he hasn't had anything to eat. Wife at bedside.  - Wife states pt has been trying to eat a "high protein" diet given his leg wounds. - Has some trouble chewing tough foods and/or meats. - No unintentional weight loss reported. - Nutrition focused physical exam completed to upper body; no muscle or subcutaneous fat depletion noticed and N/A to lower body  given spinal cord injury.    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  Estimated Nutritional Needs:   Kcal:  2200-2500 (22-25 kcal/kg IBW)  Protein:  250 grams (2.5 grams/kg IBW)  Fluid:  2.3-2.5 L  EDUCATION NEEDS:   No education needs identified at this time    Jarome Matin, MS, RD, LDN, CNSC Inpatient Clinical Dietitian Pager # [phone] After hours/weekend pager # [phone]

## 2016-09-11 NOTE — Progress Notes (Signed)
RT called to patient room per patient tube placement. Patient had pulled ETT to 20 cm at the lip. I attempted to advance tube to prior position (24 cm at the lip), but ETT was dislodged. ETT withdrawn and patient manually ventilated with PEEP valve and BVM; [facility] and anesthesia called to re-intubate patient. 8.0 ETT placed without complication and patient placed back on previous ventilator settings. Vitals currently stable.

## 2016-09-11 NOTE — Progress Notes (Signed)
Weaned patient on 10/+10 per Dr. Reginia NaasAlva's request. Patient weaned for 20 mins and O2 sats decreased to 84%. Patient returned to full support and is comfortable at this time. RT will continue to monitor patient.

## 2016-09-11 NOTE — Progress Notes (Signed)
Looked up from computer to see patient reaching for his head with his hands which were in safety mitts, as he was moving his right hand back down to the bed he hit his ET tube and pulled it out approximately 2 cm.  Called RT and notified [facility].  After RT attempted to reinsert ET tube to original position and wasn't able to, anesthesia was called to reintubate patient.  Vital signs remained stable throughout extubation and reintubation.  Will continue to monitor.  Heloise PurpuraSusan Candise Crabtree RN

## 2016-09-12 ENCOUNTER — Inpatient Hospital Stay (HOSPITAL_COMMUNITY): Payer: 59

## 2016-09-12 LAB — CBC
HCT: 31.9 % — ABNORMAL LOW (ref 39.0–52.0)
HEMOGLOBIN: 10.1 g/dL — AB (ref 13.0–17.0)
MCH: 30.7 pg (ref 26.0–34.0)
MCHC: 31.7 g/dL (ref 30.0–36.0)
MCV: 97 fL (ref 78.0–100.0)
Platelets: 116 10*3/uL — ABNORMAL LOW (ref 150–400)
RBC: 3.29 MIL/uL — AB (ref 4.22–5.81)
RDW: 14.2 % (ref 11.5–15.5)
WBC: 11.3 10*3/uL — ABNORMAL HIGH (ref 4.0–10.5)

## 2016-09-12 LAB — GLUCOSE, CAPILLARY
GLUCOSE-CAPILLARY: 104 mg/dL — AB (ref 65–99)
GLUCOSE-CAPILLARY: 131 mg/dL — AB (ref 65–99)
GLUCOSE-CAPILLARY: 141 mg/dL — AB (ref 65–99)
Glucose-Capillary: 140 mg/dL — ABNORMAL HIGH (ref 65–99)
Glucose-Capillary: 185 mg/dL — ABNORMAL HIGH (ref 65–99)

## 2016-09-12 LAB — BASIC METABOLIC PANEL
Anion gap: 5 (ref 5–15)
BUN: 20 mg/dL (ref 6–20)
CALCIUM: 7.4 mg/dL — AB (ref 8.9–10.3)
CO2: 36 mmol/L — ABNORMAL HIGH (ref 22–32)
Chloride: 96 mmol/L — ABNORMAL LOW (ref 101–111)
Glucose, Bld: 101 mg/dL — ABNORMAL HIGH (ref 65–99)
Potassium: 3.4 mmol/L — ABNORMAL LOW (ref 3.5–5.1)
SODIUM: 137 mmol/L (ref 135–145)

## 2016-09-12 LAB — CULTURE, RESPIRATORY W GRAM STAIN: Special Requests: NORMAL

## 2016-09-12 LAB — CULTURE, RESPIRATORY: CULTURE: NORMAL

## 2016-09-12 MED ORDER — VITAL AF 1.2 CAL PO LIQD
1000.0000 mL | ORAL | Status: DC
  Administered 2016-09-12 – 2016-09-13 (×4): 1000 mL
  Filled 2016-09-12 (×2): qty 1000

## 2016-09-12 MED ORDER — PROPOFOL 1000 MG/100ML IV EMUL
INTRAVENOUS | Status: AC
  Administered 2016-09-12: 40 ug/kg/min
  Filled 2016-09-12: qty 100

## 2016-09-12 MED ORDER — SODIUM CHLORIDE 0.9 % IV SOLN
25.0000 ug/h | INTRAVENOUS | Status: DC
  Administered 2016-09-12: 250 ug/h via INTRAVENOUS
  Administered 2016-09-13: 200 ug/h via INTRAVENOUS
  Administered 2016-09-14: 125 ug/h via INTRAVENOUS
  Administered 2016-09-14: 100 ug/h via INTRAVENOUS
  Filled 2016-09-12 (×4): qty 50

## 2016-09-12 MED ORDER — POTASSIUM CHLORIDE 20 MEQ/15ML (10%) PO SOLN
40.0000 meq | Freq: Once | ORAL | Status: AC
  Administered 2016-09-12: 40 meq
  Filled 2016-09-12: qty 30

## 2016-09-12 MED ORDER — DEXMEDETOMIDINE HCL IN NACL 400 MCG/100ML IV SOLN: 0.4000 ug/kg/h | INTRAVENOUS | Status: DC

## 2016-09-12 MED ORDER — CHLORHEXIDINE GLUCONATE CLOTH 2 % EX PADS
6.0000 | MEDICATED_PAD | Freq: Every day | CUTANEOUS | Status: DC
  Administered 2016-09-12 – 2016-09-14 (×2): 6 via TOPICAL

## 2016-09-12 MED ORDER — QUETIAPINE FUMARATE 50 MG PO TABS
50.0000 mg | ORAL_TABLET | Freq: Two times a day (BID) | ORAL | Status: DC
  Administered 2016-09-12 – 2016-09-14 (×6): 50 mg via ORAL
  Filled 2016-09-12 (×5): qty 1

## 2016-09-12 MED ORDER — DEXMEDETOMIDINE HCL IN NACL 400 MCG/100ML IV SOLN
0.4000 ug/kg/h | INTRAVENOUS | Status: DC
  Administered 2016-09-12: 1 ug/kg/h via INTRAVENOUS
  Administered 2016-09-12: 0.8 ug/kg/h via INTRAVENOUS
  Administered 2016-09-12: 0.4 ug/kg/h via INTRAVENOUS
  Administered 2016-09-12 (×2): 0.8 ug/kg/h via INTRAVENOUS
  Administered 2016-09-12: 1 ug/kg/h via INTRAVENOUS
  Administered 2016-09-13: 0.6 ug/kg/h via INTRAVENOUS
  Administered 2016-09-13: 0.4 ug/kg/h via INTRAVENOUS
  Administered 2016-09-13: 0.8 ug/kg/h via INTRAVENOUS
  Administered 2016-09-13: 0.6 ug/kg/h via INTRAVENOUS
  Administered 2016-09-13: 0.8 ug/kg/h via INTRAVENOUS
  Administered 2016-09-14: 0.4 ug/kg/h via INTRAVENOUS
  Administered 2016-09-14: 0.5 ug/kg/h via INTRAVENOUS
  Administered 2016-09-14 (×2): 0.4 ug/kg/h via INTRAVENOUS
  Administered 2016-09-15: 0.5 ug/kg/h via INTRAVENOUS
  Filled 2016-09-12 (×15): qty 100

## 2016-09-12 MED ORDER — PRO-STAT SUGAR FREE PO LIQD
60.0000 mL | Freq: Three times a day (TID) | ORAL | Status: DC
  Administered 2016-09-12 (×3): 60 mL
  Filled 2016-09-12 (×3): qty 60

## 2016-09-12 MED ORDER — FENTANYL 2500MCG IN NS 250ML (10MCG/ML) PREMIX INFUSION
25.0000 ug/h | INTRAVENOUS | Status: DC
  Filled 2016-09-12: qty 250

## 2016-09-12 NOTE — Progress Notes (Signed)
Name: [name] MRN: [id number] DOB: [birth year]    ADMISSION DATE:  2016/07/31 CONSULTATION DATE:  5/31  REFERRING MD :  Dr. Willette PaSheehan [facility]  CHIEF COMPLAINT:  Pneumonia  66 year old paraplegic since 2009/T 8 injury with OSA but not on CPAP, admitted 5/31 with left-sided community acquired pneumonia, progressed to white out of left lung And acute hypercarbic respiratory failure requiring emergent intubation 6/4 Bronchoscopy performed with removal of thick inspissated secretions from left lung on 6/4 and 6/5 with some improvement in aeration of the left lung  PFT 11/09/14:  showed FVC=2.92 (46%), FEV1=2.12 (45%), %1sec=72, mid-flowsreduced at 38% predicted; no improvement in FEV1 post-bronchodil; LungVol showed TLC=4.68 (53%), RV=1.86 (65%), RV/TLC=40; DLCO=66% predicted. Home Sleep Study 11/20/14 showed 40 apneas and 254 hypopneas for an AHI=25.1/hr of sleep; 519 desaturations occurred w/ lowest O2sat=71% & ave O2sat=82%    SIGNIFICANT EVENTS  5/31 admit 09/07/2016 - dyspnea still worse  Than baseline per wife but improved since admit. REfuses bipap repeatedly due to claustrophobia. Hypercapnia 80s but improved. Getting lasix. Wife concerned about rising wbc 6/4 intubated. Bronch, sputum sent  6/5 repeat bronch. Mucomyst added. Vanc started as got report of MRSA UTI from foley prior to admit.  6/6 self extubated accidental >> reintubated by anesthesia   Culture data 5/31 sputum: noramal flora UC (prior to admit): MRSA  5/31 BCX2: neg 6/4 sputum: GPC pairs>>> 6/5 UC>>>ng  ABX Zosyn 5/31>> vanc 6/5>>>6/7  Echo ef 45%   SUBJECTIVE/OVERNIGHT/INTERVAL HX  Events overnight noted, was reintubated by anesthesia edited accidental self extubation, remains sedated on propofol and fentanyl drip Remains critically ill and hypotensive Neo-Synephrine drip  BP (!) 98/53   Pulse 74   Temp 97.2 F (36.2 C) (Oral)   Resp 12   Ht 6' 7.02" (2.007 m)   Wt (!) 365 lb (165.6 kg)   SpO2 95%    BMI 41.10 kg/m    Intake/Output Summary (Last 24 hours) at 09/12/16 0929 Last data filed at 09/12/16 0900  Gross per 24 hour  Intake          4000.52 ml  Output             2060 ml  Net          1940.52 ml     EXAM General appearance:  Morbidly obese, sedated on vent  HEENT- no pallor/icterus or JVD Lungs/chest: scattered rhonchi, improved air movement on left anterior but decreased axilla, CV: RRR, no MRGs AF w/ CVR on tele  Abdomen: Soft, non-tender; no masses or HSM Extremities: + LE peripheral edema Skin: Normal temperature, turgor.dry scaly texture  Neuro/Psych: sedated. RA SS -3 LABS  PULMONARY  Recent Labs Lab 09/07/16 0830 09/09/16 1400 09/09/16 1648 09/09/16 2100 09/11/16 0920  PHART 7.384 7.208* 7.472* 7.566* 7.407  PCO2ART 87.3* 116* 61.6* 48.2* 61.4*  PO2ART 71.7* 49.0* 88.6 54.6* 84.5  HCO3 51.0* 44.6* 44.5* 43.5* 37.9*  O2SAT 93.7 76.0 97.8 91.8 95.9    CBC  Recent Labs Lab 09/08/16 0346 09/11/16 0350 09/12/16 0344  HGB 13.2 10.0* 10.1*  HCT 41.3 32.0* 31.9*  WBC 14.0* 10.6* 11.3*  PLT 144* 121* 116*    COAGULATION No results for input(s): INR in the last 168 hours.  CARDIAC    Recent Labs Lab 09/08/16 0346  TROPONINI <0.03   No results for input(s): PROBNP in the last 168 hours.   CHEMISTRY  Recent Labs Lab 09/09/16 [id number] 09/09/16 [id number] 09/10/16 [id number] 09/10/16 1633 09/11/16 0350 09/11/16 2048 09/12/16 [id number]  NA  --  135 136  --  138 141 137  K  --  5.5* 3.8  --  2.9* 3.9 3.4*  CL  --  84* 87*  --  93* 98* 96*  CO2  --  45* 40*  --  38* 36* 36*  GLUCOSE  --  111* 94  --  103* 118* 101*  BUN  --  20 23*  --  16 23* 20  CREATININE  --  0.57* 0.56*  --  0.36* 0.33* <0.30*  CALCIUM  --  8.9 8.4*  --  7.9* 8.1* 7.4*  MG 2.1  --  2.0 1.9 1.9 2.0  --   PHOS 6.5*  --  2.0* 2.6 3.3 2.9  --    CrCl cannot be calculated (This lab value cannot be used to calculate CrCl because it is not a number: <0.30).   LIVER  Recent  Labs Lab 09/08/16 0346 09/11/16 0350  AST 12* 13*  ALT 12* 13*  ALKPHOS 84 60  BILITOT 0.9 1.0  PROT 6.9 5.2*  ALBUMIN 3.7 2.7*     INFECTIOUS  Recent Labs Lab 09/02/2016 1525 09/03/2016 1922 09/06/16 0920 09/07/16 0709 09/08/16 0346  LATICACIDVEN 1.25 0.82  --   --   --   PROCALCITON  --   --  0.14 <0.10 <0.10     ENDOCRINE CBG (last 3)   Recent Labs  09/11/16 2324 09/12/16 0320 09/12/16 0818  GLUCAP 123* 104* 131*      IMAGING x48h  - image(s) personally visualized  -   highlighted in bold Dg Abd 1 View  Result Date: 09/10/2016 CLINICAL DATA:  66 year old male with nasogastric tube placement. Initial encounter. EXAM: ABDOMEN - 1 VIEW COMPARISON:  02/19/2014 CT. FINDINGS: Nasogastric tube tip at the expected level of the gastric antrum. Heart may be enlarged. Aortic calcification. Postsurgical changes lumbar spine. IMPRESSION: Nasogastric tube tip at the level of the gastric antrum. Electronically Signed   By: Lacy Duverney M.D.   On: 09/10/2016 15:33   Dg Chest Port 1 View  Result Date: 09/11/2016 CLINICAL DATA:  Intubation. EXAM: PORTABLE CHEST 1 VIEW 4:19 p.m. COMPARISON:  09/12/2014 at 5:11 a.m. FINDINGS: Endotracheal tube is in good position. NG tube tip is below the diaphragm. There is increased atelectasis at the right lung base. There is pulmonary vascular congestion with persistent density at the left lung base which is probably a combination of pleural effusion and atelectasis. IMPRESSION: 1. Endotracheal tube in good position. 2. Increased atelectasis at the right lung base. 3. Persistent atelectasis/effusion at the left lung base. 4. Pulmonary vascular congestion. Electronically Signed   By: Francene Boyers M.D.   On: 09/11/2016 16:46   Dg Chest Port 1 View  Result Date: 09/11/2016 CLINICAL DATA:  Acute on chronic respiratory failure, pneumonia, morbid obesity, CHF. EXAM: PORTABLE CHEST 1 VIEW COMPARISON:  Portable chest x-ray of September 10, 2016 FINDINGS: Aeration  of the left lung has improved markedly. The left hemidiaphragm remains obscured in the retrocardiac region dense. Density in the right perihilar region persists. The heart is normal in size. The central pulmonary vascularity is prominent. There is calcification in the wall of the aortic arch. The endotracheal tube tip lies approximately 5.8 cm above the carina. The esophagogastric tube tip and proximal port project below the GE junction. The PICC line tip projects at the junction of the right and left brachiocephalic veins. IMPRESSION: Marked improvement in the appearance of the left lung with decreased atelectasis.  Persistent left lower lobe atelectasis or pneumonia. There is persistent subsegmental atelectasis in the right perihilar region. The support tubes are in stable position. Thoracic aortic atherosclerosis. Electronically Signed   By: David  [place] M.D.   On: 09/11/2016 07:18   Dg Chest Port 1 View  Result Date: 09/10/2016 CLINICAL DATA:  Central line placement EXAM: PORTABLE CHEST 1 VIEW COMPARISON:  09/10/2016 1057 hours FINDINGS: Endotracheal tube and NG tube are stable. Near complete opacification of the left hemithorax is worse. There is shift of the mediastinum to the left. Right PICC is been placed. Tip is in the upper SVC. Subsegmental atelectasis at the right lung base. IMPRESSION: Right PICC placed.  Tip is in the upper SVC Worsening opacification of the left hemithorax, now with evidence of volume loss. Electronically Signed   By: Jolaine Click M.D.   On: 09/10/2016 12:35   Dg Chest Port 1 View  Result Date: 09/10/2016 CLINICAL DATA:  Atelectasis EXAM: PORTABLE CHEST 1 VIEW COMPARISON:  09/10/2016 FINDINGS: Endotracheal tube and NG tube are unchanged. There is improved aeration in the left lung. Continued diffuse left lung airspace disease and probable moderate left effusion. There is cardiomegaly with vascular congestion. IMPRESSION: Improving aeration on the left with continued diffuse  left lung airspace disease and moderate left effusion. Cardiomegaly, vascular congestion. Electronically Signed   By: Charlett Nose M.D.   On: 09/10/2016 11:07   Dg Abd Portable 1v  Result Date: 09/11/2016 CLINICAL DATA:  OG tube placement. EXAM: PORTABLE ABDOMEN - 1 VIEW COMPARISON:  09/10/2016 FINDINGS: OG tube tip is in the distal stomach, essentially unchanged in position since the prior study. Persistent atelectasis/ effusion at the left lung base. Endotracheal tube in good position. IMPRESSION: OG tube tip in the distal stomach. Electronically Signed   By: Francene Boyers M.D.   On: 09/11/2016 16:46   Reviewed 6/7 improved aeration but no infiltrate and right lower lobe  ASSESSMENT and PLAN  Acute on chronic respiratory failure w/ hypoxia and hypercarbia in setting of CAP (NOS) and persistent left lung atelectasis/collapse from mucous plugging. Prob  OSA and OHS   -Oxygenation is improved -Compensated hypercarbia  Plan Ventilator settings were reviewed and adjusted  Kee pEEP at 10  for atelectasis, respiratory rate has been decreased to normalize pH Mucomyst x 6 doses total    Circulatory shock- likely related to mechanical ventilation and sedation  chronic systolic and diastolic HF Chronic atrial fibrillation Plan Neo for MAP > 65  Restart Lasix once off pressors Coreg on hold, continue Eliquis  Hypokalemia- will be repleted  CAP/ atelectasis -antibiotics broadened to Zosyn due to clinical worsening await final respiratory culture, can discontinue vancomycin since no MRSA isolated-  Paraplegia d/t spinal cord infarct Plan Cont supportive care PAD protocol - trial of propofol and use Precedex instead with fentanyl drip with goal RAS is -1 to facilitate weaning Will add Seroquel 50 twice a day  Tube feeds will be titrated once off propofol  Wife updated on a daily basis Guarded prognosis given for mechanical ventilation, may require tracheostomy based on progress during  this week   The patient is critically ill with multiple organ systems failure and requires high complexity decision making for assessment and support, frequent evaluation and titration of therapies, application of advanced monitoring technologies and extensive interpretation of multiple databases. Critical Care Time devoted to patient care services described in this note independent of APP time is 35 minutes.    Oretha Milch MD   09/12/2016  9:29 AM

## 2016-09-12 NOTE — Progress Notes (Signed)
Nutrition Follow-up  DOCUMENTATION CODES:   Morbid obesity  INTERVENTION:  Will change TF regimen: 60 mL Prostat TID with Vital AF 1.2 @ 30 mL/hr to increase by 10 mL every 4 hours to reach goal rate of Vital AF 1.2 @ 70 mL/hr. At goal rate, this regimen will provide 2616 kcal (105% estimated kcal need), 216 grams of protein (86% estimated protein need), and 1362 mL free water.   NUTRITION DIAGNOSIS:   Increased nutrient needs related to acute illness, wound healing as evidenced by estimated needs. -ongoing  GOAL:   Provide needs based on ASPEN/SCCM guidelines -met for kcal, not for protein, at this time.  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 He is morbidly obese and was seen in the pulmonary clinic in 2016 where a sleep study found an AHI of 25.1/hr of sleep. He was ordered CPAP, but was unable to get it for insurance reasons. He was recently treated as an outpatient for pneumonia with a course of Levaquin.5/29 he began to have increasing confusion until 5/30 when he became hypoxic. His wife contacted his PCP who recommended he present to the emergency department for further evaluation.  6/7 Pt remains intubated with OGT in place. He continues to receive  Vital High Protein @ 20 mL/hr with 11 packets of Prostat/day. Propofol not weaned yesterday, but talked with Dr. Elsworth Soho this AM and he reports plan to d/c Propofol and order Precedex today. Will adjust TF regimen as outlined above with this plan in mind. No new weight today.   Patient is currently intubated on ventilator support MV: 7.2 L/min Temp (24hrs), Avg:98.5 F (36.9 C), Min:97.2 F (36.2 C), Max:99.5 F (37.5 C) Propofol: 39.7 ml/hr (1048 kcal from fat); plan to turn off and switch to  Precedex today. BP: 102/57 and MAP: 70  Medications reviewed; 15 mL liquid multivitamin per OGT/day, 40 mg Protonix per OGT/day, 40 mEq KCl per OGT x2 doses yesterday and x1 dose today. Labs reviewed; CBGs: 104 and 131 mg/dL this AM, K: 3.4 mmol/L, Cl: 96 mmol/L, creatinine: <0.3 mg/dL, Ca: 7.4 mg/dL.  Drips: Propofol @ 40 mcg/kg/min, Neo @ 90 mcg/min, Fentanyl @ 250 mcg/hr.    6/6 - Weight stable from yesterday.  - Pt receiving Vital High Protein @ 20 mL/hr with 11 packets of Prostat/day via OGT.  - Propofol rate this AM is the same as yesterday, but RN reports plan to hopefully wean off of Propofol today.  - Will monitor and adjust TF regimen tomorrow.  - Informed RN of plan to maintain current TF regimen for the rest of today.  - Pt underwent bedside bronch yesterday AM.   Patient is currently intubated on ventilator support MV: 7.2 L/min Temp (24hrs), Avg:98.9 F (37.2 C), Min:98.3 F (36.8 C), Max:99.3 F (37.4 C) Propofol: 39.7 ml/hr (1048 kcal from fat) BP: 96/52 and MAP: 65 K: 2.9 mmol/L Drips: Propofol @ 40 mcg/kg/min, Neo @ 60 mcg/min, Fentanyl @ 250 mcg/hr.    6/5 - Pt became less responsive yesterday afternoon with hypoxia and hypercarbia and pt was emergently intubated 6/4 at ~1500.  - OGT placed at that time and TF consult received this AM.  - Estimated nutrition needs updated and based on ventilation, ASPEN/SCCM guidelines and with respect to wounds.  -  Weight has been fairly stable over the past 2 days but will monitor closely.  Patient is currently intubated on ventilator support MV: 8.9L/min Temp (24hrs), Avg:98.7 F (37.1 C), Min:97.9 F (36.6 C), Max:99.7 F (37.6 C) Propofol: 39.66m/hr (1048 kcal from fat) BP:130/52 andMAP: 74 Drips: Propofol @ 40 mcg/kg/min, Neo @ 55 mcg/min, Fentanyl @ 250 mcg/hr.    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  Estimated Nutritional Needs:   Kcal:  2200-2500 (22-25 kcal/kg IBW)  Protein:  250 grams (2.5 grams/kg IBW)  Fluid:  2.3-2.5 L  EDUCATION NEEDS:   No education needs identified at this time    JJarome Matin MS, RD, LDN, CNSC Inpatient Clinical Dietitian Pager # [phone] hours/weekend pager # [phone]

## 2016-09-12 NOTE — Progress Notes (Signed)
Spoke with [name] in Infection Prevention regarding contact precautions being discontinued due to his urine culture being negative.  Contact precautions will be discontinued.  Harold AaseSusan J Jessamy Torosyan RN

## 2016-09-12 NOTE — Progress Notes (Signed)
eLink Physician Progress Note and Electrolyte Replacement  Patient Name: Harold AxeMark C Acoff DOB: 10/05/1950 MRN: [id number]  Date of Service  09/12/2016   HPI/Events of Note    Recent Labs Lab 09/09/16 0334 09/09/16 0755 09/10/16 0312 09/10/16 1633 09/11/16 0350 09/11/16 2048 09/12/16 0344  NA  --  135 136  --  138 141 137  K  --  5.5* 3.8  --  2.9* 3.9 3.4*  CL  --  84* 87*  --  93* 98* 96*  CO2  --  45* 40*  --  38* 36* 36*  GLUCOSE  --  111* 94  --  103* 118* 101*  BUN  --  20 23*  --  16 23* 20  CREATININE  --  0.57* 0.56*  --  0.36* 0.33* <0.30*  CALCIUM  --  8.9 8.4*  --  7.9* 8.1* 7.4*  MG 2.1  --  2.0 1.9 1.9 2.0  --   PHOS 6.5*  --  2.0* 2.6 3.3 2.9  --     CrCl cannot be calculated (This lab value cannot be used to calculate CrCl because it is not a number: <0.30).  Intake/Output      06/06 0701 - 06/07 0700   I.V. (mL/kg) 2532.8 (15.3)   NG/GT 480   IV Piggyback 950   Total Intake(mL/kg) 3962.8 (23.9)   Urine (mL/kg/hr) 2150 (0.5)   Emesis/NG output 160 (0)   Total Output 2310   Net +1652.8        - I/O DETAILED x 24h    Total I/O In: 1814.3 [I.V.:1274.3; NG/GT:240; IV Piggyback:300] Out: 1135 [Urine:975; Emesis/NG output:160] - I/O THIS SHIFT    ASSESSMENT Low K   eICURN Interventions  40meq kcl via tube   ASSESSMENT: MAJOR ELECTROLYTE      Dr. Kalman ShanMurali Sady Monaco, M.D., [facility] Hospital GrovesF.C.C.P Pulmonary and Critical Care Medicine Staff Physician [facility] Pager: [phone], If no answer or between  15:00h - 7:00h: call [phone]  09/12/2016 6:24 AM

## 2016-09-12 NOTE — Progress Notes (Signed)
Date:  September 12, 2016  Chart reviewed for concurrent status and case management needs.  Will continue to follow patient progress. Remains on full vent support has failed weaning attempts x2. Discharge Planning: following for needs  Expected discharge date: [id number]  Marcelle SmilingRhonda Sonya Gunnoe, BSN, Windsor HeightsRN3, ConnecticutCCM   [phone]
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
## 2016-09-13 ENCOUNTER — Inpatient Hospital Stay (HOSPITAL_COMMUNITY): Payer: 59

## 2016-09-13 LAB — BLOOD GAS, ARTERIAL
Acid-Base Excess: 9.8 mmol/L — ABNORMAL HIGH (ref 0.0–2.0)
BICARBONATE: 34.6 mmol/L — AB (ref 20.0–28.0)
Drawn by: 422461
FIO2: 30
LHR: 12 {breaths}/min
MECHVT: 600 mL
O2 Saturation: 92.9 %
PEEP: 10 cmH2O
PO2 ART: 67.2 mmHg — AB (ref 83.0–108.0)
Patient temperature: 99.3
pCO2 arterial: 49.7 mmHg — ABNORMAL HIGH (ref 32.0–48.0)
pH, Arterial: 7.458 — ABNORMAL HIGH (ref 7.350–7.450)

## 2016-09-13 LAB — BASIC METABOLIC PANEL
ANION GAP: 6 (ref 5–15)
BUN: 20 mg/dL (ref 6–20)
CO2: 35 mmol/L — ABNORMAL HIGH (ref 22–32)
Calcium: 7.7 mg/dL — ABNORMAL LOW (ref 8.9–10.3)
Chloride: 101 mmol/L (ref 101–111)
Creatinine, Ser: 0.34 mg/dL — ABNORMAL LOW (ref 0.61–1.24)
Glucose, Bld: 145 mg/dL — ABNORMAL HIGH (ref 65–99)
POTASSIUM: 3.7 mmol/L (ref 3.5–5.1)
SODIUM: 142 mmol/L (ref 135–145)

## 2016-09-13 LAB — CBC
HCT: 32.1 % — ABNORMAL LOW (ref 39.0–52.0)
Hemoglobin: 9.9 g/dL — ABNORMAL LOW (ref 13.0–17.0)
MCH: 29.9 pg (ref 26.0–34.0)
MCHC: 30.8 g/dL (ref 30.0–36.0)
MCV: 97 fL (ref 78.0–100.0)
PLATELETS: 110 10*3/uL — AB (ref 150–400)
RBC: 3.31 MIL/uL — AB (ref 4.22–5.81)
RDW: 13.9 % (ref 11.5–15.5)
WBC: 8.5 10*3/uL (ref 4.0–10.5)

## 2016-09-13 LAB — MAGNESIUM: MAGNESIUM: 1.8 mg/dL (ref 1.7–2.4)

## 2016-09-13 LAB — PHOSPHORUS: PHOSPHORUS: 2.5 mg/dL (ref 2.5–4.6)

## 2016-09-13 LAB — GLUCOSE, CAPILLARY
GLUCOSE-CAPILLARY: 147 mg/dL — AB (ref 65–99)
GLUCOSE-CAPILLARY: 132 mg/dL — AB (ref 65–99)
Glucose-Capillary: 139 mg/dL — ABNORMAL HIGH (ref 65–99)
Glucose-Capillary: 124 mg/dL — ABNORMAL HIGH (ref 65–99)
Glucose-Capillary: 124 mg/dL — ABNORMAL HIGH (ref 65–99)

## 2016-09-13 MED ORDER — VITAL AF 1.2 CAL PO LIQD
1000.0000 mL | ORAL | Status: DC
  Administered 2016-09-14: 1000 mL
  Filled 2016-09-13 (×4): qty 1000

## 2016-09-13 MED ORDER — PRO-STAT SUGAR FREE PO LIQD
60.0000 mL | Freq: Every day | ORAL | Status: DC
  Administered 2016-09-13 – 2016-09-15 (×10): 60 mL
  Filled 2016-09-13 (×10): qty 60

## 2016-09-13 MED ORDER — FUROSEMIDE 10 MG/ML IJ SOLN
40.0000 mg | Freq: Once | INTRAMUSCULAR | Status: AC
  Administered 2016-09-13: 40 mg via INTRAVENOUS
  Filled 2016-09-13: qty 4

## 2016-09-13 NOTE — Progress Notes (Signed)
Name: [name] MRN: [id number] DOB: January 24, 1951    ADMISSION DATE:  08/11/2016 CONSULTATION DATE:  5/31  REFERRING MD :  Dr. Willette Pa [facility]  CHIEF COMPLAINT:  Pneumonia  brief  66 year old paraplegic since 2009/T 8 injury with OSA but not on CPAP, admitted 5/31 with left-sided community acquired pneumonia, progressed to white out of left lung And acute hypercarbic respiratory failure requiring emergent intubation 6/4 Bronchoscopy performed with removal of thick inspissated secretions from left lung on 6/4 and 6/5 with some improvement in aeration of the left lung  PFT 11/09/14:  showed FVC=2.92 (46%), FEV1=2.12 (45%), %1sec=72, mid-flowsreduced at 38% predicted; no improvement in FEV1 post-bronchodil; LungVol showed TLC=4.68 (53%), RV=1.86 (65%), RV/TLC=40; DLCO=66% predicted. Home Sleep Study 11/20/14 showed 40 apneas and 254 hypopneas for an AHI=25.1/hr of sleep; 519 desaturations occurred w/ lowest O2sat=71% & ave O2sat=82%    SIGNIFICANT EVENTS  5/31 admit 09/07/2016 - dyspnea still worse  Than baseline per wife but improved since admit. REfuses bipap repeatedly due to claustrophobia. Hypercapnia 80s but improved. Getting lasix. Wife concerned about rising wbc 6/4 intubated. Bronch, sputum sent  6/5 repeat bronch. Mucomyst added. Vanc started as got report of MRSA UTI from foley prior to admit.  6/6 self extubated and re intubated by anesthesia   Culture data 5/31 sputum: noramal flora UC (prior to admit): MRSA  5/31 BCX2: neg 6/4 sputum: GPC pairs>>>nl flora 6/5 UC>>>neg  ABX Zosyn 5/31>> vanc 6/5>>>6/7  Echo ef 45%   SUBJECTIVE/OVERNIGHT/INTERVAL HX Remains critically ill More awake on vent ON neo gtt, lower doses Afebrile C/o loose stools  BP 115/61   Pulse 81   Temp 99.8 F (37.7 C) (Oral)   Resp 14   Ht 6' 7.02" (2.007 m)   Wt (!) 381 lb 6.3 oz (173 kg)   SpO2 95%   BMI 42.95 kg/m    Intake/Output Summary (Last 24 hours) at 09/13/16 0923 Last data  filed at 09/13/16 0800  Gross per 24 hour  Intake          2674.59 ml  Output             2625 ml  Net            49.59 ml     EXAM General:  Morbid obese, anxious affect HEENT:ETT- oral, no pallor, octerus , or JVD Neuro: paraplegic, more awke, folwos commands, mouths words CV: HSIR IR PULM: Coarse bs bilaterally, decreased left base WU:JWJX, non-tender, bsx4 active  Extremities: warm/dry,++edema , left ankle with dressing Skin: no rashes or lesions  LABS  PULMONARY  Recent Labs Lab 09/09/16 1400 09/09/16 1648 09/09/16 2100 09/11/16 0920 09/13/16 0359  PHART 7.208* 7.472* 7.566* 7.407 7.458*  PCO2ART 116* 61.6* 48.2* 61.4* 49.7*  PO2ART 49.0* 88.6 54.6* 84.5 67.2*  HCO3 44.6* 44.5* 43.5* 37.9* 34.6*  O2SAT 76.0 97.8 91.8 95.9 92.9    CBC  Recent Labs Lab 09/11/16 0350 09/12/16 0344 09/13/16 0311  HGB 10.0* 10.1* 9.9*  HCT 32.0* 31.9* 32.1*  WBC 10.6* 11.3* 8.5  PLT 121* 116* 110*    COAGULATION No results for input(s): INR in the last 168 hours.  CARDIAC    Recent Labs Lab 09/08/16 0346  TROPONINI <0.03   No results for input(s): PROBNP in the last 168 hours.   CHEMISTRY  Recent Labs Lab 09/10/16 9147 09/10/16 1633 09/11/16 0350 09/11/16 2048 09/12/16 0344 09/13/16 0311  NA 136  --  138 141 137 142  K 3.8  --  2.9* 3.9 3.4* 3.7  CL 87*  --  93* 98* 96* 101  CO2 40*  --  38* 36* 36* 35*  GLUCOSE 94  --  103* 118* 101* 145*  BUN 23*  --  16 23* 20 20  CREATININE 0.56*  --  0.36* 0.33* <0.30* 0.34*  CALCIUM 8.4*  --  7.9* 8.1* 7.4* 7.7*  MG 2.0 1.9 1.9 2.0  --  1.8  PHOS 2.0* 2.6 3.3 2.9  --  2.5   Estimated Creatinine Clearance: 161.1 mL/min (A) (by C-G formula based on SCr of 0.34 mg/dL (L)).   LIVER  Recent Labs Lab 09/08/16 0346 09/11/16 0350  AST 12* 13*  ALT 12* 13*  ALKPHOS 84 60  BILITOT 0.9 1.0  PROT 6.9 5.2*  ALBUMIN 3.7 2.7*     INFECTIOUS  Recent Labs Lab 09/07/16 0709 09/08/16 0346  PROCALCITON  <0.10 <0.10     ENDOCRINE CBG (last 3)   Recent Labs  09/12/16 2311 09/13/16 0315 09/13/16 0739  GLUCAP 185* 139* 147*      IMAGING x48h  - image(s) personally visualized  -   highlighted in bold Dg Chest Port 1 View  Result Date: 09/13/2016 CLINICAL DATA:  Respiratory failure. EXAM: PORTABLE CHEST 1 VIEW COMPARISON:  09/12/2016 . FINDINGS: Endotracheal tube, NG tube, right PICC line stable position. Cardiomegaly with bilateral from interstitial prominence and bilateral pleural effusions. Findings consistent CHF. Persistent basilar atelectasis. IMPRESSION: 1. Lines and tubes in stable position. 2. Cardiomegaly with diffuse bilateral pulmonary interstitial prominence and bilateral pleural effusions again noted. Findings consistent with CHF. No interim change. Electronically Signed   By: Maisie Fus  Register   On: 09/13/2016 06:58   Dg Chest Port 1 View  Result Date: 09/12/2016 CLINICAL DATA:  Respiratory failure, atrial fibrillation, diastolic CHF, restrictive lung disease, shortness of breath, morbid obesity EXAM: PORTABLE CHEST 1 VIEW COMPARISON:  Portable exam 0948 hours compared to 09/11/2016 FINDINGS: Tip of endotracheal tube projects 4.4 cm above carina. Nasogastric tube extends to at least the inferior mediastinum. RIGHT arm PICC line tip projects over proximal SVC. Enlargement of cardiac silhouette with pulmonary vascular congestion. Bibasilar atelectasis and perihilar edema persists. Probable small bibasilar effusions. No pneumothorax. Atherosclerotic calcification aorta. Bones demineralized. IMPRESSION: Pulmonary edema with bibasilar atelectasis and small pleural effusions. Electronically Signed   By: Ulyses Southward M.D.   On: 09/12/2016 10:16   Dg Chest Port 1 View  Result Date: 09/11/2016 CLINICAL DATA:  Intubation. EXAM: PORTABLE CHEST 1 VIEW 4:19 p.m. COMPARISON:  09/12/2014 at 5:11 a.m. FINDINGS: Endotracheal tube is in good position. NG tube tip is below the diaphragm. There is  increased atelectasis at the right lung base. There is pulmonary vascular congestion with persistent density at the left lung base which is probably a combination of pleural effusion and atelectasis. IMPRESSION: 1. Endotracheal tube in good position. 2. Increased atelectasis at the right lung base. 3. Persistent atelectasis/effusion at the left lung base. 4. Pulmonary vascular congestion. Electronically Signed   By: Francene Boyers M.D.   On: 09/11/2016 16:46   Dg Abd Portable 1v  Result Date: 09/11/2016 CLINICAL DATA:  OG tube placement. EXAM: PORTABLE ABDOMEN - 1 VIEW COMPARISON:  09/10/2016 FINDINGS: OG tube tip is in the distal stomach, essentially unchanged in position since the prior study. Persistent atelectasis/ effusion at the left lung base. Endotracheal tube in good position. IMPRESSION: OG tube tip in the distal stomach. Electronically Signed   By: Francene Boyers M.D.   On: 09/11/2016  16:46     ASSESSMENT and PLAN  Acute on chronic respiratory failure w/ hypoxia and hypercarbia in setting of CAP (NOS) and persistent left lung atelectasis/collapse from mucous plugging. Prob  OSA and OHS   -Oxygenation improved -Compensated hypercarbia  Plan Ventilator settings were reviewed and adjusted -lower PEEP to 8 RR has been decreased to normalize pH Can dc Mucomyst x 6 doses total    Circulatory shock- likely related to mechanical ventilation and sedation  chronic systolic and diastolic HF Chronic atrial fibrillation Plan Neo for MAP > 65, being tapered  Lasix 40 x1  Coreg on hold, continue Eliquis  Hypokalemia- will be repleted  CAP/ atelectasis -antibiotics broadened to Zosyn due to clinical worsening await final respiratory culture, plan x 10 ds, discontinued vancomycin since no MRSA isolated-  Paraplegia d/t spinal cord infarct Plan Cont supportive care PAD protocol - trial of Precedex with fentanyl drip with goal RASS goal -1 to facilitate weaning Added Seroquel 50  twice a day 6/7 & this seems to be working   Diarrhea -Lower Tfs to 50/h  Wife updated on a daily basis Guarded prognosis given for mechanical ventilation,  But making some progress now, so hopeful to wean    The patient is critically ill with multiple organ systems failure and requires high complexity decision making for assessment and support, frequent evaluation and titration of therapies, application of advanced monitoring technologies and extensive interpretation of multiple databases. Critical Care Time devoted to patient care services described in this note independent of APP time is 33 minutes.    Oretha MilchALVA,Florean Hoobler V. MD

## 2016-09-13 NOTE — Progress Notes (Signed)
Nutrition Follow-up  DOCUMENTATION CODES:   Morbid obesity  INTERVENTION:  -  Continue Vital AF 1.2 @ 50 mL/hr per Dr. Vassie Loll. Will increase Prostat to 60 mL x5/day (10 packets. This regimen will provide 2440 kcal, 240 grams of protein, and 973 mL free water. - Will initiate PEPuP protocol this AM.  - Free water flush per MD/NP, if desired.   NUTRITION DIAGNOSIS:   Increased nutrient needs related to acute illness, wound healing as evidenced by estimated needs. -ongoing  GOAL:   Provide needs based on ASPEN/SCCM guidelines -unmet with TF rate at this time.  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 He is morbidly obese and was seen in the pulmonary clinic in 2016 where a sleep study found an AHI of 25.1/hr of sleep. He was ordered CPAP, but was unable to get it for insurance reasons. He was recently treated as an outpatient for pneumonia with a course of Levaquin.5/29 he began to have increasing confusion until 5/30 when he became hypoxic. His wife contacted his PCP who recommended he present to the emergency department for further evaluation.  6/8 Pt remains intubated with OGT in place. He is currently receiving Vital AF 1.2 @ 50 mL/hr with 60 mL Prostat TID which is providing 2040 kcal, 180 grams of protein, and 973 mL free water. Per Dr. Reginia Naas note, plan to hold @ 50 mL/hr d/t diarrhea. Will continue to monitor stool output and make recommendations/adjustments as warranted. New weight this AM shows +7.4 kg from 6/6. Estimated needs remain based on IBW per ASPEN/SCCM guidelines.   Patient is currently intubated on ventilator support MV: 12.1 L/min Temp (24hrs), Avg:99.6 F (37.6 C), Min:99 F (37.2 C), Max:100.1 F (37.8 C) Propofol:  none BP:108/67 and MAP: 80  Medications reviewed; 40 mg IV Lasix x1 dose today, 15 mL liquid multivitamin per OGT/day, 40 mg Protonix per OGT/day. Labs reviewed; creatinine: 0.34 mg/dL, Ca: 7.7 mg/dL.  Drips: Precedex @ 0.4 mcg/kg/hr, Neo @ 10 mcg/min, Fentanyl @ 100 mcg/hr.    6/7 - He continues to receive  Vital High Protein @ 20 mL/hr with 11 packets of Prostat/day via OGT. - Talked with Dr. Vassie Loll this AM and he reports plan to d/c Propofol and order Precedex today.  - Will adjust TF regimen as outlined above with this plan in mind.  - No new weight today.   Patient is currently intubated on ventilator support MV: 7.2 L/min Temp (24hrs), Avg:98.5 F (36.9 C), Min:97.2 F (36.2 C), Max:99.5 F (37.5 C) Propofol: 39.7 ml/hr (1048 kcal from fat); plan to turn off and switch to Precedex today. BP: 102/57 and MAP: 70 Drips: Propofol @ 40 mcg/kg/min, Neo @ 90 mcg/min, Fentanyl @ 250 mcg/hr.    6/6 - Weight stable from yesterday.  - Pt receiving Vital High Protein @ 20 mL/hr with 11 packets of Prostat/day via OGT.  - Propofol rate this AM is the same as yesterday, but RN reports plan to hopefully wean off of Propofol today.  - Will monitor and adjust TF regimen tomorrow.  - Informed RN of plan to maintain current TF regimen for the rest of today.  - Pt underwent bedside bronch yesterday AM.   Patient is currently intubated on ventilator support MV: 7.2L/min Temp (24hrs), Avg:98.9  F (37.2 C), Min:98.3 F (36.8 C), Max:99.3 F (37.4 C) Propofol: 39.157ml/hr (1048 kcal from fat) BP: 96/52 and MAP: 65 K: 2.9 mmol/L Drips:Propofol @ 40 mcg/kg/min, Neo @ 60 mcg/min, Fentanyl @ 250 mcg/hr.    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  Estimated Nutritional Needs:   Kcal:  2200-2500 (22-25 kcal/kg IBW)  Protein:  250 grams (2.5 grams/kg IBW)  Fluid:  2.3-2.5 L  EDUCATION NEEDS:   No education needs identified at this time    Trenton GammonJessica Sharleen Szczesny, MS, RD, LDN, CNSC Inpatient Clinical Dietitian Pager # [phone] After hours/weekend pager # [phone]

## 2016-09-14 ENCOUNTER — Inpatient Hospital Stay (HOSPITAL_COMMUNITY): Payer: 59

## 2016-09-14 LAB — CBC
HCT: 29.3 % — ABNORMAL LOW (ref 39.0–52.0)
Hemoglobin: 9.1 g/dL — ABNORMAL LOW (ref 13.0–17.0)
MCH: 29.9 pg (ref 26.0–34.0)
MCHC: 31.1 g/dL (ref 30.0–36.0)
MCV: 96.4 fL (ref 78.0–100.0)
PLATELETS: 110 10*3/uL — AB (ref 150–400)
RBC: 3.04 MIL/uL — ABNORMAL LOW (ref 4.22–5.81)
RDW: 14 % (ref 11.5–15.5)
WBC: 7.6 10*3/uL (ref 4.0–10.5)

## 2016-09-14 LAB — GLUCOSE, CAPILLARY
GLUCOSE-CAPILLARY: 117 mg/dL — AB (ref 65–99)
GLUCOSE-CAPILLARY: 121 mg/dL — AB (ref 65–99)
GLUCOSE-CAPILLARY: 130 mg/dL — AB (ref 65–99)
GLUCOSE-CAPILLARY: 137 mg/dL — AB (ref 65–99)
GLUCOSE-CAPILLARY: 141 mg/dL — AB (ref 65–99)
Glucose-Capillary: 136 mg/dL — ABNORMAL HIGH (ref 65–99)
Glucose-Capillary: 125 mg/dL — ABNORMAL HIGH (ref 65–99)

## 2016-09-14 LAB — C DIFFICILE QUICK SCREEN W PCR REFLEX
C DIFFICILE (CDIFF) INTERP: NOT DETECTED
C Diff antigen: NEGATIVE
C Diff toxin: NEGATIVE

## 2016-09-14 LAB — PHOSPHORUS: PHOSPHORUS: 3 mg/dL (ref 2.5–4.6)

## 2016-09-14 LAB — BASIC METABOLIC PANEL
ANION GAP: 7 (ref 5–15)
BUN: 17 mg/dL (ref 6–20)
CALCIUM: 8.1 mg/dL — AB (ref 8.9–10.3)
CO2: 35 mmol/L — ABNORMAL HIGH (ref 22–32)
CREATININE: 0.39 mg/dL — AB (ref 0.61–1.24)
Chloride: 102 mmol/L (ref 101–111)
GFR calc Af Amer: >60 mL/min (ref >60)
GLUCOSE: 132 mg/dL — AB (ref 65–99)
Potassium: 3.2 mmol/L — ABNORMAL LOW (ref 3.5–5.1)
Sodium: 144 mmol/L (ref 135–145)

## 2016-09-14 LAB — MAGNESIUM: Magnesium: 1.8 mg/dL (ref 1.7–2.4)

## 2016-09-14 MED ORDER — POTASSIUM CHLORIDE 20 MEQ/15ML (10%) PO SOLN
40.0000 meq | Freq: Two times a day (BID) | ORAL | Status: AC
  Administered 2016-09-14 (×2): 40 meq
  Filled 2016-09-14 (×2): qty 30

## 2016-09-14 MED ORDER — FUROSEMIDE 10 MG/ML IJ SOLN
40.0000 mg | Freq: Once | INTRAMUSCULAR | Status: AC
  Administered 2016-09-14: 40 mg via INTRAVENOUS
  Filled 2016-09-14: qty 4

## 2016-09-14 MED ORDER — CARVEDILOL 12.5 MG PO TABS
12.5000 mg | ORAL_TABLET | Freq: Two times a day (BID) | ORAL | Status: DC
  Administered 2016-09-14 (×2): 12.5 mg via ORAL
  Filled 2016-09-14 (×3): qty 1

## 2016-09-14 MED ORDER — QUETIAPINE FUMARATE 50 MG PO TABS
50.0000 mg | ORAL_TABLET | Freq: Two times a day (BID) | ORAL | Status: DC
  Administered 2016-09-15 (×2): 50 mg
  Filled 2016-09-14 (×3): qty 1

## 2016-09-14 MED ORDER — CHLORHEXIDINE GLUCONATE CLOTH 2 % EX PADS
6.0000 | MEDICATED_PAD | Freq: Every day | CUTANEOUS | Status: DC
  Administered 2016-09-15 – 2016-09-17 (×3): 6 via TOPICAL

## 2016-09-14 MED ORDER — CARVEDILOL 12.5 MG PO TABS
12.5000 mg | ORAL_TABLET | Freq: Two times a day (BID) | ORAL | Status: DC
  Administered 2016-09-15 – 2016-09-16 (×3): 12.5 mg
  Filled 2016-09-14 (×3): qty 1

## 2016-09-14 MED ORDER — CARVEDILOL 12.5 MG PO TABS: 25.0000 mg | ORAL_TABLET | Freq: Two times a day (BID) | ORAL | Status: DC

## 2016-09-14 NOTE — Progress Notes (Signed)
Pt weaned on 8 of pressure support for 3.5 hours.  Later in afternoon pt weaned on 10 of pressure support for 2 hours.

## 2016-09-14 NOTE — Progress Notes (Addendum)
Name: [name] MRN: [id number] DOB: 03/10/1951    ADMISSION DATE:  08/08/2016 CONSULTATION DATE:  5/31  REFERRING MD :  Dr. Willette Pa [facility]  CHIEF COMPLAINT:  Pneumonia  Brief:  66 year old paraplegic since 2009/T 8 injury with OSA but not on CPAP, admitted 5/31 with left-sided community acquired pneumonia, progressed to white out of left lung And acute hypercarbic respiratory failure requiring emergent intubation 6/4 Bronchoscopy performed with removal of thick inspissated secretions from left lung on 6/4 and 6/5 with some improvement in aeration of the left lung  PFT 11/09/14:  showed FVC=2.92 (46%), FEV1=2.12 (45%), %1sec=72, mid-flowsreduced at 38% predicted; no improvement in FEV1 post-bronchodil; LungVol showed TLC=4.68 (53%), RV=1.86 (65%), RV/TLC=40; DLCO=66% predicted. Home Sleep Study 11/20/14 showed 40 apneas and 254 hypopneas for an AHI=25.1/hr of sleep; 519 desaturations occurred w/ lowest O2sat=71% & ave O2sat=82%    SIGNIFICANT EVENTS  5/31 admit 09/07/2016 - dyspnea still worse  Than baseline per wife but improved since admit. REfuses bipap repeatedly due to claustrophobia. Hypercapnia 80s but improved. Getting lasix. Wife concerned about rising wbc 6/4 intubated. Bronch, sputum sent  6/5 repeat bronch. Mucomyst added. Vanc started as got report of MRSA UTI from foley prior to admit.  6/6 self extubated and re intubated by anesthesia   Culture data 5/31 sputum: noramal flora UC (prior to admit): MRSA  5/31 BCX2: neg 6/4 sputum: GPC pairs>>>nl flora 6/5 UC>>>neg  ABX Zosyn 5/31>> vanc 6/5>>>6/7  Echo ef 45%   SUBJECTIVE/OVERNIGHT/INTERVAL HX Has been off pressors for 24 hours Precedex weaned off. Fentanyl is weaning down He is on pressure support at 8/8. Appears comfortable  BP (!) 145/64   Pulse (!) 101   Temp 98.2 F (36.8 C) (Axillary)   Resp 15   Ht 6' 7.02" (2.007 m)   Wt (!) 372 lb 9.2 oz (169 kg)   SpO2 97%   BMI 41.96 kg/m    Intake/Output  Summary (Last 24 hours) at 09/14/16 0917 Last data filed at 09/14/16 0800  Gross per 24 hour  Intake          3063.85 ml  Output             2100 ml  Net           963.85 ml     EXAM Blood pressure (!) 145/64, pulse (!) 101, temperature 98.2 F (36.8 C), temperature source Axillary, resp. rate 15, height 6' 7.02" (2.007 m), weight (!) 372 lb 9.2 oz (169 kg), SpO2 97 %. Gen:      No acute distress HEENT:  EOMI, sclera anicteric, ETT in place Neck:     No masses; no thyromegaly Lungs:    Clear to auscultation bilaterally; normal respiratory effort CV:         Regular rate and rhythm; no murmurs Abd:      + bowel sounds; soft, non-tender; no palpable masses, no distension Ext:    No edema; adequate peripheral perfusion Skin:      Warm and dry; + edema, no rash Neuro: paraplegic, awake, responsive   LABS  PULMONARY  Recent Labs Lab 09/09/16 1400 09/09/16 1648 09/09/16 2100 09/11/16 0920 09/13/16 0359  PHART 7.208* 7.472* 7.566* 7.407 7.458*  PCO2ART 116* 61.6* 48.2* 61.4* 49.7*  PO2ART 49.0* 88.6 54.6* 84.5 67.2*  HCO3 44.6* 44.5* 43.5* 37.9* 34.6*  O2SAT 76.0 97.8 91.8 95.9 92.9    CBC  Recent Labs Lab 09/12/16 0344 09/13/16 0311 09/14/16 0351  HGB 10.1* 9.9* 9.1*  HCT 31.9* 32.1* 29.3*  WBC 11.3* 8.5 7.6  PLT 116* 110* 110*    COAGULATION No results for input(s): INR in the last 168 hours.  CARDIAC    Recent Labs Lab 09/08/16 0346  TROPONINI <0.03   No results for input(s): PROBNP in the last 168 hours.   CHEMISTRY  Recent Labs Lab 09/10/16 1633 09/11/16 0350 09/11/16 2048 09/12/16 0344 09/13/16 0311 09/14/16 0351  NA  --  138 141 137 142 144  K  --  2.9* 3.9 3.4* 3.7 3.2*  CL  --  93* 98* 96* 101 102  CO2  --  38* 36* 36* 35* 35*  GLUCOSE  --  103* 118* 101* 145* 132*  BUN  --  16 23* 20 20 17   CREATININE  --  0.36* 0.33* <0.30* 0.34* 0.39*  CALCIUM  --  7.9* 8.1* 7.4* 7.7* 8.1*  MG 1.9 1.9 2.0  --  1.8 1.8  PHOS 2.6 3.3 2.9  --   2.5 3.0   Estimated Creatinine Clearance: 159 mL/min (A) (by C-G formula based on SCr of 0.39 mg/dL (L)).   LIVER  Recent Labs Lab 09/08/16 0346 09/11/16 0350  AST 12* 13*  ALT 12* 13*  ALKPHOS 84 60  BILITOT 0.9 1.0  PROT 6.9 5.2*  ALBUMIN 3.7 2.7*     INFECTIOUS  Recent Labs Lab 09/08/16 0346  PROCALCITON <0.10     ENDOCRINE CBG (last 3)   Recent Labs  09/14/16 0028 09/14/16 0434 09/14/16 0744  GLUCAP 117* 136* 137*      IMAGING x48h  - image(s) personally visualized  -   highlighted in bold Dg Chest Port 1 View  Result Date: 09/14/2016 CLINICAL DATA:  Hypoxia EXAM: PORTABLE CHEST 1 VIEW COMPARISON:  September 13, 2016 FINDINGS: Endotracheal tube tip is 3 cm above the carina. Nasogastric tube tip and side port are below the diaphragm. No pneumothorax. There are bilateral pleural effusions with interstitial edema. There is patchy airspace opacity in the lung bases. There is cardiomegaly with pulmonary venous hypertension. There is aortic atherosclerosis. No adenopathy. No bone lesions. IMPRESSION: Tube positions as described without pneumothorax. Changes of congestive heart failure persists. Airspace opacity in the lung bases may represent alveolar edema or superimposed pneumonia. Both entities may exist concurrently. Appearance essentially stable compared to 1 day prior. Electronically Signed   By: Bretta Bang III M.D.   On: 09/14/2016 07:33   Dg Chest Port 1 View  Result Date: 09/13/2016 CLINICAL DATA:  Respiratory failure. EXAM: PORTABLE CHEST 1 VIEW COMPARISON:  09/12/2016 . FINDINGS: Endotracheal tube, NG tube, right PICC line stable position. Cardiomegaly with bilateral from interstitial prominence and bilateral pleural effusions. Findings consistent CHF. Persistent basilar atelectasis. IMPRESSION: 1. Lines and tubes in stable position. 2. Cardiomegaly with diffuse bilateral pulmonary interstitial prominence and bilateral pleural effusions again noted. Findings  consistent with CHF. No interim change. Electronically Signed   By: Maisie Fus  Register   On: 09/13/2016 06:58   Dg Chest Port 1 View  Result Date: 09/12/2016 CLINICAL DATA:  Respiratory failure, atrial fibrillation, diastolic CHF, restrictive lung disease, shortness of breath, morbid obesity EXAM: PORTABLE CHEST 1 VIEW COMPARISON:  Portable exam 0948 hours compared to 09/11/2016 FINDINGS: Tip of endotracheal tube projects 4.4 cm above carina. Nasogastric tube extends to at least the inferior mediastinum. RIGHT arm PICC line tip projects over proximal SVC. Enlargement of cardiac silhouette with pulmonary vascular congestion. Bibasilar atelectasis and perihilar edema persists. Probable small bibasilar effusions. No pneumothorax. Atherosclerotic  calcification aorta. Bones demineralized. IMPRESSION: Pulmonary edema with bibasilar atelectasis and small pleural effusions. Electronically Signed   By: Ulyses SouthwardMark  Boles M.D.   On: 09/12/2016 10:16     ASSESSMENT and PLAN  Acute on chronic respiratory failure w/ hypoxia and hypercarbia in setting of CAP (NOS) and persistent left lung atelectasis/collapse from mucous plugging. Prob  OSA and OHS   Off mucomyst Continue weaning trials.  Hopeful for extubation within a day or 2   Circulatory shock- likely related to mechanical ventilation and sedation Chronic systolic and diastolic HF Chronic atrial fibrillation  Off neo Lasix for diuresis. One dose 40 mg today. Continue eliquis,  Restart coreg at 1/2 dose Replete K  CAP/ atelectasis  Continue zosyn for 10 days Follow cultures Vaco stopped as there is no evidence of MRSA  Paraplegia d/t spinal cord infarct Continue supportive care Wean fentanyl. Off precedex Continue Seroquel  No family at bedside today. Guarded prognosis given for mechanical ventilation. Some progress today. Hopeful for wean.  The patient is critically ill with multiple organ system failure and requires high complexity decision  making for assessment and support, frequent evaluation and titration of therapies, advanced monitoring, review of radiographic studies and interpretation of complex data.   Critical Care Time devoted to patient care services, exclusive of separately billable procedures, described in this note is 35 minutes.   Chilton GreathousePraveen Burnette Valenti MD [facility] Pager [phone] If no answer or after 3pm call: [phone] 09/14/2016, 9:23 AM

## 2016-09-15 ENCOUNTER — Inpatient Hospital Stay (HOSPITAL_COMMUNITY): Payer: 59

## 2016-09-15 LAB — CBC
HCT: 30.8 % — ABNORMAL LOW (ref 39.0–52.0)
Hemoglobin: 9.3 g/dL — ABNORMAL LOW (ref 13.0–17.0)
MCH: 29.4 pg (ref 26.0–34.0)
MCHC: 30.2 g/dL (ref 30.0–36.0)
MCV: 97.5 fL (ref 78.0–100.0)
PLATELETS: 128 10*3/uL — AB (ref 150–400)
RBC: 3.16 MIL/uL — AB (ref 4.22–5.81)
RDW: 13.9 % (ref 11.5–15.5)
WBC: 7.7 10*3/uL (ref 4.0–10.5)

## 2016-09-15 LAB — PHOSPHORUS: Phosphorus: 3.2 mg/dL (ref 2.5–4.6)

## 2016-09-15 LAB — GLUCOSE, CAPILLARY
GLUCOSE-CAPILLARY: 141 mg/dL — AB (ref 65–99)
GLUCOSE-CAPILLARY: 104 mg/dL — AB (ref 65–99)
GLUCOSE-CAPILLARY: 106 mg/dL — AB (ref 65–99)
Glucose-Capillary: 127 mg/dL — ABNORMAL HIGH (ref 65–99)
Glucose-Capillary: 121 mg/dL — ABNORMAL HIGH (ref 65–99)

## 2016-09-15 LAB — MAGNESIUM: MAGNESIUM: 2 mg/dL (ref 1.7–2.4)

## 2016-09-15 LAB — BASIC METABOLIC PANEL
ANION GAP: 8 (ref 5–15)
BUN: 22 mg/dL — AB (ref 6–20)
CALCIUM: 8.4 mg/dL — AB (ref 8.9–10.3)
CO2: 34 mmol/L — ABNORMAL HIGH (ref 22–32)
Chloride: 104 mmol/L (ref 101–111)
Creatinine, Ser: 0.47 mg/dL — ABNORMAL LOW (ref 0.61–1.24)
GFR calc Af Amer: >60 mL/min (ref >60)
GLUCOSE: 136 mg/dL — AB (ref 65–99)
POTASSIUM: 3.2 mmol/L — AB (ref 3.5–5.1)
SODIUM: 146 mmol/L — AB (ref 135–145)

## 2016-09-15 MED ORDER — HYDROCODONE-ACETAMINOPHEN 7.5-325 MG PO TABS
1.0000 | ORAL_TABLET | Freq: Four times a day (QID) | ORAL | Status: DC | PRN
  Administered 2016-09-15 – 2016-09-17 (×6): 1 via ORAL
  Filled 2016-09-15 (×8): qty 1

## 2016-09-15 MED ORDER — ORAL CARE MOUTH RINSE
15.0000 mL | Freq: Two times a day (BID) | OROMUCOSAL | Status: DC
  Administered 2016-09-16 – 2016-09-18 (×4): 15 mL via OROMUCOSAL

## 2016-09-15 MED ORDER — FENTANYL CITRATE (PF) 100 MCG/2ML IJ SOLN
12.5000 ug | INTRAMUSCULAR | Status: DC | PRN
  Administered 2016-09-15: 12.5 ug via INTRAVENOUS
  Filled 2016-09-15: qty 2

## 2016-09-15 MED ORDER — POTASSIUM CHLORIDE 20 MEQ/15ML (10%) PO SOLN
40.0000 meq | Freq: Two times a day (BID) | ORAL | Status: AC
  Administered 2016-09-15 (×2): 40 meq
  Filled 2016-09-15 (×2): qty 30

## 2016-09-15 NOTE — Progress Notes (Signed)
Spoke to pt regarding cpap.  Pt states he had a sleep study done, and was told that he did not have sleep apnea.  Pt also states he does not wear cpap at home and declined to wear one tonight.  Pt was advised that RT is available all night should he change his mind.  RN aware.

## 2016-09-15 NOTE — Progress Notes (Signed)
Name: [name] MRN: [id number] DOB: [birth year]    ADMISSION DATE:  10-Sep-2016 CONSULTATION DATE:  5/31  REFERRING MD :  Dr. Willette PaSheehan TRH  CHIEF COMPLAINT:  Pneumonia  Brief:  66 year old paraplegic since 2009/T 8 injury with OSA but not on CPAP, admitted 5/31 with left-sided community acquired pneumonia, progressed to white out of left lung And acute hypercarbic respiratory failure requiring emergent intubation 6/4 Bronchoscopy performed with removal of thick inspissated secretions from left lung on 6/4 and 6/5 with some improvement in aeration of the left lung  PFT 11/09/14:  showed FVC=2.92 (46%), FEV1=2.12 (45%), %1sec=72, mid-flowsreduced at 38% predicted; no improvement in FEV1 post-bronchodil; LungVol showed TLC=4.68 (53%), RV=1.86 (65%), RV/TLC=40; DLCO=66% predicted. Home Sleep Study 11/20/14 showed 40 apneas and 254 hypopneas for an AHI=25.1/hr of sleep; 519 desaturations occurred w/ lowest O2sat=71% & ave O2sat=82%    SIGNIFICANT EVENTS  5/31 admit 09/07/2016 - dyspnea still worse  Than baseline per wife but improved since admit. REfuses bipap repeatedly due to claustrophobia. Hypercapnia 80s but improved. Getting lasix. Wife concerned about rising wbc 6/4 intubated. Bronch, sputum sent  6/5 repeat bronch. Mucomyst added. Vanc started as got report of MRSA UTI from foley prior to admit.  6/6 self extubated and re intubated by anesthesia   Culture data 5/31 sputum: noramal flora UC (prior to admit): MRSA  5/31 BCX2: neg 6/4 sputum: GPC pairs>>>nl flora 6/5 UC>>>neg  ABX Zosyn 5/31>> vanc 6/5>>>6/7  Echo ef 45%   SUBJECTIVE/OVERNIGHT/INTERVAL HX Continues to do well on weans  BP (!) 100/51   Pulse 79   Temp 98.7 F (37.1 C) (Oral)   Resp (!) 24   Ht 6' 7.02" (2.007 m)   Wt (!) 372 lb 9.2 oz (169 kg)   SpO2 96%   BMI 41.96 kg/m    Intake/Output Summary (Last 24 hours) at 09/15/16 1209 Last data filed at 09/15/16 1100  Gross per 24 hour  Intake           2517.72 ml  Output             2470 ml  Net            47.72 ml     EXAM Gen:      No acute distress HEENT:  EOMI, sclera anicteric Neck:     No masses; no thyromegaly Lungs:    Scattered rhonchi; normal respiratory effort CV:         Regular rate and rhythm; no murmurs Abd:      + bowel sounds; soft, non-tender; no palpable masses, no distension Ext:    No edema; adequate peripheral perfusion Skin:      Warm and dry; no rash Neuro: alert and oriented x 3 Psych: normal mood and affect  LABS  PULMONARY  Recent Labs Lab 09/09/16 1400 09/09/16 1648 09/09/16 2100 09/11/16 0920 09/13/16 0359  PHART 7.208* 7.472* 7.566* 7.407 7.458*  PCO2ART 116* 61.6* 48.2* 61.4* 49.7*  PO2ART 49.0* 88.6 54.6* 84.5 67.2*  HCO3 44.6* 44.5* 43.5* 37.9* 34.6*  O2SAT 76.0 97.8 91.8 95.9 92.9    CBC  Recent Labs Lab 09/13/16 0311 09/14/16 0351 09/15/16 0452  HGB 9.9* 9.1* 9.3*  HCT 32.1* 29.3* 30.8*  WBC 8.5 7.6 7.7  PLT 110* 110* 128*    COAGULATION No results for input(s): INR in the last 168 hours.  CARDIAC   No results for input(s): TROPONINI in the last 168 hours. No results for input(s): PROBNP in the last 168  hours.   CHEMISTRY  Recent Labs Lab 09/11/16 0350 09/11/16 2048 09/12/16 0344 09/13/16 0311 09/14/16 0351 09/15/16 0452  NA 138 141 137 142 144 146*  K 2.9* 3.9 3.4* 3.7 3.2* 3.2*  CL 93* 98* 96* 101 102 104  CO2 38* 36* 36* 35* 35* 34*  GLUCOSE 103* 118* 101* 145* 132* 136*  BUN 16 23* 20 20 17  22*  CREATININE 0.36* 0.33* <0.30* 0.34* 0.39* 0.47*  CALCIUM 7.9* 8.1* 7.4* 7.7* 8.1* 8.4*  MG 1.9 2.0  --  1.8 1.8 2.0  PHOS 3.3 2.9  --  2.5 3.0 3.2   Estimated Creatinine Clearance: 159 mL/min (A) (by C-G formula based on SCr of 0.47 mg/dL (L)).   LIVER  Recent Labs Lab 09/11/16 0350  AST 13*  ALT 13*  ALKPHOS 60  BILITOT 1.0  PROT 5.2*  ALBUMIN 2.7*     INFECTIOUS No results for input(s): LATICACIDVEN, PROCALCITON in the last 168  hours.   ENDOCRINE CBG (last 3)   Recent Labs  09/14/16 2341 09/15/16 0320 09/15/16 0729  GLUCAP 130* 127* 141*      IMAGING x48h  - image(s) personally visualized  -   highlighted in bold Dg Chest Port 1 View  Result Date: 09/15/2016 CLINICAL DATA:  Acute respiratory failure EXAM: PORTABLE CHEST 1 VIEW COMPARISON:  09/14/2016 and prior radiographs FINDINGS: An endotracheal tube with tip 3.7 cm above the carina, NG tube entering the stomach with tip off the field of view and right PICC line with tip overlying the upper SVC again noted. Cardiomegaly, pulmonary edema, left lower lung consolidation/atelectasis and right basilar atelectasis again noted. There is no evidence of pneumothorax. There has been little interval change since the prior study. IMPRESSION: Unchanged appearance of the chest. Electronically Signed   By: Harmon Pier M.D.   On: 09/15/2016 07:04   Dg Chest Port 1 View  Result Date: 09/14/2016 CLINICAL DATA:  Hypoxia EXAM: PORTABLE CHEST 1 VIEW COMPARISON:  September 13, 2016 FINDINGS: Endotracheal tube tip is 3 cm above the carina. Nasogastric tube tip and side port are below the diaphragm. No pneumothorax. There are bilateral pleural effusions with interstitial edema. There is patchy airspace opacity in the lung bases. There is cardiomegaly with pulmonary venous hypertension. There is aortic atherosclerosis. No adenopathy. No bone lesions. IMPRESSION: Tube positions as described without pneumothorax. Changes of congestive heart failure persists. Airspace opacity in the lung bases may represent alveolar edema or superimposed pneumonia. Both entities may exist concurrently. Appearance essentially stable compared to 1 day prior. Electronically Signed   By: Bretta Bang III M.D.   On: 09/14/2016 07:33     ASSESSMENT and PLAN  Acute on chronic respiratory failure w/ hypoxia and hypercarbia in setting of CAP (NOS) and persistent left lung atelectasis/collapse from mucous  plugging. Prob  OSA and OHS   Off mucomyst Continue weaning trials.  Plan for extubation today   Circulatory shock- likely related to mechanical ventilation and sedation Chronic systolic and diastolic HF Chronic atrial fibrillation Lasix for diuresis. 40 mg IV qd Continue eliquis,  Restarted coreg at 1/2 dose Replete K  CAP/ atelectasis  CXR much improved 6/10 Continue zosyn for 10 days. Stop after today Vaco stopped as there is no evidence of MRSA  Paraplegia d/t spinal cord infarct Continue supportive care Off sedation Continue Seroquel  No family at bedside today. Guarded prognosis given for mechanical ventilation. Some progress today. Hopeful for wean.  The patient is critically ill with multiple organ  system failure and requires high complexity decision making for assessment and support, frequent evaluation and titration of therapies, advanced monitoring, review of radiographic studies and interpretation of complex data.   Critical Care Time devoted to patient care services, exclusive of separately billable procedures, described in this note is 35 minutes.   Chilton Greathouse MD [facility] Pager [phone] If no answer or after 3pm call: [phone] 09/15/2016, 12:09 PM

## 2016-09-15 NOTE — Progress Notes (Signed)
eLink Physician-Brief Progress Note Patient Name: Glendon AxeMark C Richman DOB: 1950-11-16 MRN: [id number]   Date of Service  09/15/2016  HPI/Events of Note  Hypokalemia  eICU Interventions  Potassium replaced     Intervention Category Intermediate Interventions: Electrolyte abnormality - evaluation and management  Anival Pasha 09/15/2016, 6:12 AM

## 2016-09-15 NOTE — Procedures (Signed)
Extubation Procedure Note  Patient Details:   Name: Glendon AxeMark C Cargle DOB: 05-17-1950 MRN: [id number]   Airway Documentation:  Airway 8 mm (Active)  Secured at (cm) 27 cm 09/15/2016  7:22 AM  Measured From Lips 09/15/2016  7:22 AM  Secured Location Right 09/15/2016  7:22 AM  Secured By [employer] Tube Holder 09/15/2016  8:00 AM  Tube Holder Repositioned Yes 09/15/2016  8:00 AM  Cuff Pressure (cm H2O) 25 cm H2O 09/15/2016  7:22 AM  Site Condition Dry;Cool 09/14/2016  4:15 AM    Evaluation  O2 sats: 93 Complications: none Patient tolerated procedure well. Bilateral Breath Sounds: Diminished   Pt able to speak  Revonda HumphreyDePue, Erikah Thumm S 09/15/2016, 12:30 PM

## 2016-09-15 NOTE — Progress Notes (Signed)
eLink Physician-Brief Progress Note Patient Name: [name] DOB: 1951/02/28 MRN: [id number]   Date of Service  09/15/2016  HPI/Events of Note  Chronic pain issue.  Extubated today.  eICU Interventions  Stop fentanyl norco prn     Intervention Category Minor Interventions: Other:  [name], [name], [name] 09/15/2016, 4:12 PM

## 2016-09-16 ENCOUNTER — Encounter (HOSPITAL_COMMUNITY): Payer: Self-pay

## 2016-09-16 ENCOUNTER — Inpatient Hospital Stay (HOSPITAL_COMMUNITY): Payer: 59

## 2016-09-16 LAB — GLUCOSE, CAPILLARY
GLUCOSE-CAPILLARY: 112 mg/dL — AB (ref 65–99)
GLUCOSE-CAPILLARY: 129 mg/dL — AB (ref 65–99)
GLUCOSE-CAPILLARY: 135 mg/dL — AB (ref 65–99)
GLUCOSE-CAPILLARY: 140 mg/dL — AB (ref 65–99)
Glucose-Capillary: 100 mg/dL — ABNORMAL HIGH (ref 65–99)
Glucose-Capillary: 124 mg/dL — ABNORMAL HIGH (ref 65–99)

## 2016-09-16 LAB — CBC
HEMATOCRIT: 31.5 % — AB (ref 39.0–52.0)
HEMOGLOBIN: 9.5 g/dL — AB (ref 13.0–17.0)
MCH: 29.2 pg (ref 26.0–34.0)
MCHC: 30.2 g/dL (ref 30.0–36.0)
MCV: 96.9 fL (ref 78.0–100.0)
Platelets: 127 10*3/uL — ABNORMAL LOW (ref 150–400)
RBC: 3.25 MIL/uL — AB (ref 4.22–5.81)
RDW: 13.8 % (ref 11.5–15.5)
WBC: 8.7 10*3/uL (ref 4.0–10.5)

## 2016-09-16 LAB — BASIC METABOLIC PANEL
Anion gap: 7 (ref 5–15)
BUN: 16 mg/dL (ref 6–20)
CHLORIDE: 106 mmol/L (ref 101–111)
CO2: 31 mmol/L (ref 22–32)
Calcium: 7.9 mg/dL — ABNORMAL LOW (ref 8.9–10.3)
Creatinine, Ser: <0.3 mg/dL — ABNORMAL LOW (ref 0.61–1.24)
Glucose, Bld: 102 mg/dL — ABNORMAL HIGH (ref 65–99)
POTASSIUM: 4 mmol/L (ref 3.5–5.1)
SODIUM: 144 mmol/L (ref 135–145)

## 2016-09-16 LAB — MAGNESIUM: MAGNESIUM: 2 mg/dL (ref 1.7–2.4)

## 2016-09-16 LAB — PHOSPHORUS: PHOSPHORUS: 3.4 mg/dL (ref 2.5–4.6)

## 2016-09-16 MED ORDER — FUROSEMIDE 40 MG PO TABS
40.0000 mg | ORAL_TABLET | Freq: Two times a day (BID) | ORAL | Status: DC
  Administered 2016-09-16 – 2016-09-17 (×3): 40 mg via ORAL
  Filled 2016-09-16 (×3): qty 1

## 2016-09-16 MED ORDER — ASPIRIN 81 MG PO CHEW
81.0000 mg | CHEWABLE_TABLET | Freq: Every day | ORAL | Status: DC
  Administered 2016-09-16 – 2016-09-17 (×2): 81 mg via ORAL
  Filled 2016-09-16 (×2): qty 1

## 2016-09-16 MED ORDER — ADULT MULTIVITAMIN W/MINERALS CH
1.0000 | ORAL_TABLET | Freq: Every day | ORAL | Status: DC
  Administered 2016-09-16 – 2016-09-17 (×2): 1 via ORAL
  Filled 2016-09-16 (×2): qty 1

## 2016-09-16 MED ORDER — PANTOPRAZOLE SODIUM 40 MG PO TBEC
40.0000 mg | DELAYED_RELEASE_TABLET | Freq: Every day | ORAL | Status: DC
  Administered 2016-09-16 – 2016-09-17 (×2): 40 mg via ORAL
  Filled 2016-09-16 (×2): qty 1

## 2016-09-16 MED ORDER — CARVEDILOL 25 MG PO TABS
25.0000 mg | ORAL_TABLET | Freq: Two times a day (BID) | ORAL | Status: DC
  Administered 2016-09-16 – 2016-09-17 (×3): 25 mg via ORAL
  Filled 2016-09-16 (×2): qty 1
  Filled 2016-09-16: qty 2

## 2016-09-16 MED ORDER — SENNOSIDES-DOCUSATE SODIUM 8.6-50 MG PO TABS
1.0000 | ORAL_TABLET | Freq: Every day | ORAL | Status: DC
  Administered 2016-09-17: 1 via ORAL
  Filled 2016-09-16 (×2): qty 1

## 2016-09-16 MED ORDER — TIZANIDINE HCL 4 MG PO TABS
4.0000 mg | ORAL_TABLET | Freq: Three times a day (TID) | ORAL | Status: DC
Start: 2016-09-16 — End: 2016-09-18
  Administered 2016-09-16 – 2016-09-17 (×5): 4 mg via ORAL
  Filled 2016-09-16 (×5): qty 1

## 2016-09-16 NOTE — Addendum Note (Signed)
Addendum  created 09/16/16 0803 by Achille RichHodierne, Dai Apel, MD   Anesthesia Attestations filed

## 2016-09-16 NOTE — Evaluation (Addendum)
Clinical/Bedside Swallow Evaluation Patient Details  Name: [name] MRN: [id number] Date of Birth: 02/28/1951  Today's Date: 09/16/2016 Time: SLP Start Time (ACUTE ONLY): 1100 SLP Stop Time (ACUTE ONLY): 1123 SLP Time Calculation (min) (ACUTE ONLY): 23 min  Past Medical History:  Past Medical History:  Diagnosis Date  . Atrial fibrillation (HCC) 2016  . CHF (congestive heart failure) (HCC)   . Coronary artery disease   . Hypertension   . Paraplegic spinal paralysis (HCC) 2009   left side non weight bearing can move rt leg --unable to walk   . Spinal cord injury at T9 level [facility])    Past Surgical History:  Past Surgical History:  Procedure Laterality Date  . APPENDECTOMY    . BACK SURGERY    . CHOLECYSTECTOMY    . HERNIA REPAIR    . IRRIGATION AND DEBRIDEMENT ABSCESS  06/17/2011   Procedure: IRRIGATION AND DEBRIDEMENT ABSCESS;  Surgeon: Adolph Pollack, MD;  Location: [facility] ORS;  Service: General;  Laterality: N/A;   debridement sacral decubitus  . VARICOSE VEIN SURGERY     HPI:  66 year old male admitted 08/08/2016 with SOB and AMS. PMH: AFib, diastolic dysfunction, heart failure, paraplegic (T8 injury 2009), restrictive lung disease, HTN, morbid obesity (388#), CAD, sacral decubitus, recent PNA   Assessment / Plan / Recommendation Clinical Impression  No history of swallowing difficulty prior to admit. Pt was intubated for 7 days, with self-extubation /reintubation once. Pt has been receiving liquids since yesterday after extubation, and tolerating liquids and po meds well per RN.   Pt presents with adequate oral motor strength and function. Today, pt was given trials of thin liquid, puree, and solid consistencies. All items were tolerated well without overt s/s aspiration or decline in respiratory status. Timely oral prep and propulsion and trigger of swallow reflex, as well as adequate laryngeal elevation on palpation were noted. Will begin regular diet and thin liquids, with  recommendation for adherence to safe swallow precautions.   Unfortunately, pt's body habitus may prevent objective study via MBS, given constraints of the equipment.   ST will follow for assessment of diet tolerance, given increased aspiration risk from extended intubation.    SLP Visit Diagnosis: Dysphagia, pharyngeal phase (R13.13)    Aspiration Risk  Mild aspiration risk;Moderate aspiration risk    Diet Recommendation Thin liquid;Regular   Liquid Administration via: Straw;Cup Medication Administration: Whole meds with liquid Supervision: Patient able to self feed Compensations: Minimize environmental distractions;Slow rate;Small sips/bites Postural Changes: Seated upright at 90 degrees;Remain upright for at least 30 minutes after po intake    Other  Recommendations Oral Care Recommendations: Oral care QID   Follow up Recommendations None      Frequency and Duration min 1 x/week  2 weeks;1 week       Prognosis Prognosis for Safe Diet Advancement: Good      Swallow Study   General HPI: 66 year old male admitted 09/04/2016 with SOB and AMS. PMH: AFib, diastolic dysfunction, heart failure, paraplegic (T8 injury 2009), restrictive lung disease, HTN, morbid obesity (388#), CAD, sacral decubitus, recent PNA Previous Swallow Assessment: none Diet Prior to this Study: NPO Temperature Spikes Noted: No Respiratory Status: Nasal cannula History of Recent Intubation: Yes Length of Intubations (days): 7 days Date extubated: 09/15/16 Behavior/Cognition: Alert;Cooperative;Pleasant mood Oral Cavity Assessment: Within Functional Limits Oral Care Completed by SLP: No Oral Cavity - Dentition: Missing dentition;Poor condition Vision: Functional for self-feeding Self-Feeding Abilities: Able to feed self Patient Positioning: Upright in  bed Baseline Vocal Quality: Normal Volitional Cough: Strong Volitional Swallow: Able to elicit    Oral/Motor/Sensory Function Overall Oral Motor/Sensory  Function: Within functional limits   Ice Chips Ice chips: Not tested   Thin Liquid Thin Liquid: Within functional limits Presentation: Straw    Nectar Thick Nectar Thick Liquid: Not tested   Honey Thick Honey Thick Liquid: Not tested   Puree Puree: Within functional limits Presentation: Spoon;Self Fed   Solid   GO   Solid: Within functional limits Presentation: Self Fed       [name] B. [name], MSP, CCC-SLP [phone]  Harold AuroraBueche, [name] [name] 09/16/2016,11:35 AM

## 2016-09-16 NOTE — Progress Notes (Signed)
RT at bedside to administer CPAP.  Pt states that he is not ready at this time and will notify RT if he decides to utilize CPAP tonight.  RT to monitor and assess as needed.

## 2016-09-16 NOTE — Progress Notes (Signed)
Nutrition Follow-up  DOCUMENTATION CODES:   Morbid obesity  INTERVENTION:  - Diet advancement as medically feasible. - Will order appropriate oral nutrition supplements to aid in meeting estimated protein need with diet advancement. - Will continue to monitor for additional nutrition-related needs.  NUTRITION DIAGNOSIS:   Increased nutrient needs related to acute illness, wound healing as evidenced by estimated needs. -ongoing  GOAL:   Patient will meet greater than or equal to 90% of their needs -unable to meet at this time.  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 He is morbidly obese and was seen in the pulmonary clinic in 2016 where a sleep study found an AHI of 25.1/hr of sleep. He was ordered CPAP, but was unable to get it for insurance reasons. He was recently treated as an outpatient for pneumonia with a course of Levaquin.5/29 he began to have increasing confusion until 5/30 when he became hypoxic. His wife contacted his PCP who recommended he present to the emergency department for further evaluation.  6/11 Pt was extubated yesterday ~1230 with OGT out at that time; TF was turned off yesterday AM in anticipation of extubation. Pt remains NPO this AM. Estimated nutrition needs updated based on extubation. Weight continues to trend up.   Medications reviewed; 40 mg Protonix/day, 40 mEq KCl per PEG x2 doses yesterday.  Labs reviewed; CBGs: 100 and 112 mg/dL, creatinine: <1.6<0.3 mg/dL, Ca: 7.9 mg/dL.   6/8 - Pt remains intubated with OGT in place. - He is currently receiving Vital AF 1.2 @ 50 mL/hr with 60 mL Prostat TID which is providing 2040 kcal, 180 grams of protein, and 973 mL free water.  - Per Dr. Reginia NaasAlva's note, plan to hold @ 50 mL/hr d/t diarrhea.  - New  weight this AM shows +7.4 kg from 6/6.  - Estimated needs remain based on IBW per ASPEN/SCCM guidelines.   Patient is currently intubated on ventilator support MV: 12.1 L/min Temp (24hrs), Avg:99.6 F (37.6 C), Min:99 F (37.2 C), Max:100.1 F (37.8 C) Propofol: none BP:108/67 and MAP: 80 Drips: Precedex @ 0.4 mcg/kg/hr, Neo @ 10 mcg/min, Fentanyl @ 100 mcg/hr.    6/7 - He continues to receive Vital High Protein @ 20 mL/hr with 11 packets of Prostat/day via OGT. - Talked with Dr. Vassie LollAlva this AM and he reports plan to d/c Propofol and order Precedex today.  - Will adjust TF regimen as outlined above with this plan in mind.  - No new weight today.   Patient is currently intubated on ventilator support MV: 7.2L/min Temp (24hrs), Avg:98.5 F (36.9 C), Min:97.2 F (36.2 C), Max:99.5 F (37.5 C) Propofol: 39.877ml/hr (1048 kcal from fat); plan to turn off and switch to Precedex today. BP: 102/57 and MAP: 70 Drips:Propofol @ 40 mcg/kg/min, Neo @ 90 mcg/min, Fentanyl @ 250 mcg/hr.     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  Estimated Nutritional Needs:   Kcal:  1610-9604 (15-16 kcal/kg)  Protein:  165-180 grams (1-1.1 grams/kg)  Fluid:  >/= 2.5 L/day  EDUCATION NEEDS:   No education needs identified at this time    Trenton Gammon, MS, RD, LDN, CNSC Inpatient Clinical Dietitian Pager # [phone] After hours/weekend pager # [phone]

## 2016-09-16 NOTE — Progress Notes (Signed)
Date:  September 16, 2016 Chart reviewed for concurrent status and case management needs. Will continue to follow patient progress. Acute on chronic respiratory failure w/ hypoxia and hypercarbia in setting of CAP (NOS) and persistent left lung atelectasis/collapse from mucous plugging. Prob OSA and OHS  Off mucomyst Continue weaning trials.  Plan for extubation today  Discharge Planning: following for needs Expected discharge date: [id number] Marcelle SmilingRhonda Ludmilla Mcgillis, BSN, [place]3, [place]CCM   [phone]

## 2016-09-16 NOTE — Progress Notes (Signed)
Name: [name] MRN: [id number] DOB: 01-Aug-1950    ADMISSION DATE:  08/27/2016 CONSULTATION DATE:  5/31  REFERRING MD :  Dr. Willette Pa [facility]  CHIEF COMPLAINT:  Pneumonia  BRIEF SUMMARY:  66 year old paraplegic since 2009 / T8 injury with OSA but not on CPAP, admitted 5/31 with left-sided community acquired pneumonia, progressed to white out of left lung & acute hypercarbic respiratory failure requiring emergent intubation 6/4.  Bronchoscopy performed with removal of thick inspissated secretions from left lung on 6/4 and 6/5 with some improvement in aeration of the left lung   SUBJECTIVE:  RN reports no acute events > SLP evaluation pending.    BP (!) 145/65 (BP Location: Left Arm)   Pulse 79   Temp 98.7 F (37.1 C) (Oral)   Resp (!) 28   Ht 6' 7.02" (2.007 m)   Wt (!) 388 lb 0.2 oz (176 kg)   SpO2 96%   BMI 43.69 kg/m    Intake/Output Summary (Last 24 hours) at 09/16/16 0925 Last data filed at 09/16/16 0900  Gross per 24 hour  Intake              655 ml  Output             1420 ml  Net             -765 ml   EXAM General: obese male lying in bed in NAD HEENT: MM pink/moist, good dentition, no jvd Neuro: AAOx4, speech clear, MAE  CV: s1s2 rrr, no m/r/g PULM: even/non-labored, lungs bilaterally clear anterior, diminished bases  WU:JWJX, non-tender, bsx4 active  Extremities: warm/dry, BLE lymphedema, dressing on LLE c/d/i Skin: no rashes or lesions   LABS  PULMONARY  Recent Labs Lab 09/09/16 1400 09/09/16 1648 09/09/16 2100 09/11/16 0920 09/13/16 0359  PHART 7.208* 7.472* 7.566* 7.407 7.458*  PCO2ART 116* 61.6* 48.2* 61.4* 49.7*  PO2ART 49.0* 88.6 54.6* 84.5 67.2*  HCO3 44.6* 44.5* 43.5* 37.9* 34.6*  O2SAT 76.0 97.8 91.8 95.9 92.9    CBC  Recent Labs Lab 09/14/16 0351 09/15/16 0452 09/16/16 0500  HGB 9.1* 9.3* 9.5*  HCT 29.3* 30.8* 31.5*  WBC 7.6 7.7 8.7  PLT 110* 128* 127*    COAGULATION No results for input(s): INR in the last 168  hours.  CARDIAC   No results for input(s): TROPONINI in the last 168 hours. No results for input(s): PROBNP in the last 168 hours.   CHEMISTRY  Recent Labs Lab 09/11/16 2048 09/12/16 0344 09/13/16 0311 09/14/16 0351 09/15/16 0452 09/16/16 0500  NA 141 137 142 144 146* 144  K 3.9 3.4* 3.7 3.2* 3.2* 4.0  CL 98* 96* 101 102 104 106  CO2 36* 36* 35* 35* 34* 31  GLUCOSE 118* 101* 145* 132* 136* 102*  BUN 23* 20 20 17  22* 16  CREATININE 0.33* <0.30* 0.34* 0.39* 0.47* <0.30*  CALCIUM 8.1* 7.4* 7.7* 8.1* 8.4* 7.9*  MG 2.0  --  1.8 1.8 2.0 2.0  PHOS 2.9  --  2.5 3.0 3.2 3.4   CrCl cannot be calculated (This lab value cannot be used to calculate CrCl because it is not a number: <0.30).   LIVER  Recent Labs Lab 09/11/16 0350  AST 13*  ALT 13*  ALKPHOS 60  BILITOT 1.0  PROT 5.2*  ALBUMIN 2.7*    INFECTIOUS No results for input(s): LATICACIDVEN, PROCALCITON in the last 168 hours.  ENDOCRINE CBG (last 3)   Recent Labs  09/15/16 2315 09/16/16 0542 09/16/16  0747  GLUCAP 106* 100* 112*     IMAGING x48h  - image(s) personally visualized  -   highlighted in bold Dg Chest Port 1 View  Result Date: 09/16/2016 CLINICAL DATA:  Initial evaluation for respiratory failure. EXAM: PORTABLE CHEST 1 VIEW COMPARISON:  Prior radiograph from 09/15/2016. FINDINGS: Endotracheal and enteric tubes have been removed. Stable cardiomegaly. Mediastinal silhouette unchanged. Aortic atherosclerosis. Right-sided PICC catheter remains in place. Lungs are hypoinflated. Persisting and pulmonary and interstitial vascular congestion, overall relatively unchanged. Superimposed bibasilar atelectasis and/ or consolidation, also similar. No no pneumothorax. Osseous structures unchanged. IMPRESSION: 1. Interval removal of endotracheal and enteric tubes. 2. Otherwise overall little interval changed pain appearance of the chest with diffuse vascular an interstitial congestion with bibasilar opacities, either  atelectasis and/or consolidation. Electronically Signed   By: Rise Mu M.D.   On: 09/16/2016 05:59   Dg Chest Port 1 View  Result Date: 09/15/2016 CLINICAL DATA:  Acute respiratory failure EXAM: PORTABLE CHEST 1 VIEW COMPARISON:  09/14/2016 and prior radiographs FINDINGS: An endotracheal tube with tip 3.7 cm above the carina, NG tube entering the stomach with tip off the field of view and right PICC line with tip overlying the upper SVC again noted. Cardiomegaly, pulmonary edema, left lower lung consolidation/atelectasis and right basilar atelectasis again noted. There is no evidence of pneumothorax. There has been little interval change since the prior study. IMPRESSION: Unchanged appearance of the chest. Electronically Signed   By: Harmon Pier M.D.   On: 09/15/2016 07:04   STUDIES PFT 11/09/14 >> FVC=2.92 (46%), FEV1=2.12 (45%), %1sec=72, mid-flowsreduced at 38% predicted; no improvement in FEV1 post-bronchodil; LungVol showed TLC=4.68 (53%), RV=1.86 (65%), RV/TLC=40; DLCO=66% predicted. Home Sleep Study 11/20/14 >> 40 apneas and 254 hypopneas for an AHI=25.1/hr of sleep; 519 desaturations occurred w/ lowest O2 sat=71% & ave O2sat=82% ECHO 6/1 >> technically difficult study, moderate LVH, LVEF 45-50%, diffuse hypokinesis  SIGNIFICANT EVENTS  5/31  Admit 6/02  Dyspnea worse than baseline per wife but improved since admit. Refuses bipap due to claustrophobia. Hypercapnia 80s but improved. Getting lasix.   6/04  Intubated. Bronch, sputum sent  6/05  Repeat bronch. Mucomyst added. Vanc for report of MRSA UTI from foley prior to admit.  6/06  Self extubated and re intubated by anesthesia  6/10  Extubated  6/11  Tx out of ICU   CULTURES Sputum 5/31 >> normal flora  UC (prior to admit) >> MRSA BCx2 5/31 >> negative  Sputum 6/04 >> normal flora  UC 6/5 >> negative  ANTIBIOTICS Zosyn 5/31 >> Vanc 6/5 >> 6/7   ASSESSMENT and PLAN  Acute on chronic respiratory failure w/ hypoxia and  hypercarbia in setting of CAP (NOS) and persistent left lung atelectasis/collapse from mucous plugging. Prob  OSA and OHS   P: Push pulmonary hygiene- IS, flutter valve  Intermittent CXR   Circulatory shock- likely related to mechanical ventilation and sedation Chronic systolic and diastolic HF Chronic atrial fibrillation P: Continue eliquis, ASA Continue Coreg, increase back to home dose of 25mg  BID Tele monitoring  Resume lasix 40 mg BID (home dose 40 mg TID)  CAP Atelectasis with Mucus Plugging - s/p FOB with secretion clearance P: Push pulmonary hygiene as above  Monitor intermittent CXR   OSA / OHS  P:  Will need follow up for sleep as outpatient.  Has not been able to tolerate CPAP mask with claustrophobia > could use nasal pillows with chin strap  Paraplegia d/t spinal cord infarct P: Supportive care  PT efforts as able  Wean seroquel to off Bowel regimen - senokot-S QD    - Family:  Wife updated at bedside 6/11. Plan for possible d/c on 6/13.  Will have to coordinate with wife for transportation / wheelchair.   - [facility]:  [facility].  Await SLP evaluation and BM.  Goal for possible d/c on 6/13.  Canary BrimBrandi Loretto Belinsky, NP-C [facility] Pgr: [phone] or if no answer [phone] 09/16/2016, 9:25 AM

## 2016-09-17 DIAGNOSIS — J189 Pneumonia, unspecified organism: Secondary | ICD-10-CM

## 2016-09-17 DIAGNOSIS — I2510 Atherosclerotic heart disease of native coronary artery without angina pectoris: Secondary | ICD-10-CM

## 2016-09-17 DIAGNOSIS — J984 Other disorders of lung: Secondary | ICD-10-CM

## 2016-09-17 DIAGNOSIS — D649 Anemia, unspecified: Secondary | ICD-10-CM

## 2016-09-17 LAB — CBC WITH DIFFERENTIAL/PLATELET
BASOS ABS: 0 10*3/uL (ref 0.0–0.1)
Basophils Relative: 0 %
EOS PCT: 1 %
Eosinophils Absolute: 0.1 10*3/uL (ref 0.0–0.7)
HEMATOCRIT: 32.9 % — AB (ref 39.0–52.0)
Hemoglobin: 9.6 g/dL — ABNORMAL LOW (ref 13.0–17.0)
LYMPHS ABS: 1 10*3/uL (ref 0.7–4.0)
LYMPHS PCT: 14 %
MCH: 29.8 pg (ref 26.0–34.0)
MCHC: 29.2 g/dL — ABNORMAL LOW (ref 30.0–36.0)
MCV: 102.2 fL — AB (ref 78.0–100.0)
MONO ABS: 0.5 10*3/uL (ref 0.1–1.0)
Monocytes Relative: 7 %
Neutro Abs: 5.6 10*3/uL (ref 1.7–7.7)
Neutrophils Relative %: 78 %
PLATELETS: 154 10*3/uL (ref 150–400)
RBC: 3.22 MIL/uL — AB (ref 4.22–5.81)
RDW: 13.6 % (ref 11.5–15.5)
WBC: 7.1 10*3/uL (ref 4.0–10.5)

## 2016-09-17 LAB — GLUCOSE, CAPILLARY: GLUCOSE-CAPILLARY: 133 mg/dL — AB (ref 65–99)

## 2016-09-17 LAB — COMPREHENSIVE METABOLIC PANEL
ALT: 22 U/L (ref 17–63)
AST: 22 U/L (ref 15–41)
Albumin: 3 g/dL — ABNORMAL LOW (ref 3.5–5.0)
Alkaline Phosphatase: 81 U/L (ref 38–126)
Anion gap: 4 — ABNORMAL LOW (ref 5–15)
BILIRUBIN TOTAL: 0.5 mg/dL (ref 0.3–1.2)
BUN: 11 mg/dL (ref 6–20)
CO2: 38 mmol/L — ABNORMAL HIGH (ref 22–32)
CREATININE: 0.34 mg/dL — AB (ref 0.61–1.24)
Calcium: 8.4 mg/dL — ABNORMAL LOW (ref 8.9–10.3)
Chloride: 102 mmol/L (ref 101–111)
Glucose, Bld: 139 mg/dL — ABNORMAL HIGH (ref 65–99)
Potassium: 4.8 mmol/L (ref 3.5–5.1)
Sodium: 144 mmol/L (ref 135–145)
TOTAL PROTEIN: 6 g/dL — AB (ref 6.5–8.1)

## 2016-09-17 LAB — MAGNESIUM: MAGNESIUM: 2.1 mg/dL (ref 1.7–2.4)

## 2016-09-17 LAB — PHOSPHORUS: PHOSPHORUS: 3.4 mg/dL (ref 2.5–4.6)

## 2016-09-17 MED ORDER — PREMIER PROTEIN SHAKE
11.0000 [oz_av] | Freq: Two times a day (BID) | ORAL | Status: DC
  Administered 2016-09-17: 11 [oz_av] via ORAL
  Filled 2016-09-17 (×2): qty 325.31

## 2016-09-17 MED ORDER — GUAIFENESIN ER 600 MG PO TB12
1200.0000 mg | ORAL_TABLET | Freq: Two times a day (BID) | ORAL | Status: DC
  Administered 2016-09-17 (×2): 1200 mg via ORAL
  Filled 2016-09-17 (×2): qty 2

## 2016-09-17 MED ORDER — SODIUM CHLORIDE 3 % IN NEBU
4.0000 mL | INHALATION_SOLUTION | Freq: Every day | RESPIRATORY_TRACT | Status: DC
  Administered 2016-09-17: 4 mL via RESPIRATORY_TRACT
  Filled 2016-09-17 (×2): qty 4

## 2016-09-17 MED ORDER — ACETAMINOPHEN-CODEINE #4 300-60 MG PO TABS
2.0000 | ORAL_TABLET | Freq: Four times a day (QID) | ORAL | Status: DC
  Administered 2016-09-17 (×4): 2 via ORAL
  Filled 2016-09-17 (×4): qty 2

## 2016-09-17 NOTE — Progress Notes (Signed)
NUTRITION NOTE  Full follow-up note written yesterday at 0911. Pt seen by SLP today who recommends regular texture foods and thin liquids. He is currently on a Heart Healthy diet and RN was ordering lunch at time of RD visit.   Talked with pt about need to focus on protein and provided him "Protein Content of Foods" handout from the Academy of [employer]. Talked with him about discussing adding double protein portions at meals and also discussed trying Premier Protein. Pt is in agreement with both of these plans.    Trenton GammonJessica Thurl Boen, MS, RD, LDN, [facility]: [place] Rhonda WCNSC Inpatient Clinical Dietitian Pager # [phone] After hours/weekend pager # [phone]

## 2016-09-17 NOTE — Progress Notes (Signed)
  Speech Language Pathology Treatment: Dysphagia  Patient Details Name: [name] MRN: [id number] DOB: 1951-03-03 Today's Date: 09/17/2016 Time: [id number] SLP Time Calculation (min) (ACUTE ONLY): 20 min  Assessment / Plan / Recommendation Clinical Impression  Pt seen at bedside for follow up to BSE, for assessment of diet tolerance. No overt s/s aspiration observed or reported. ST will sign off for now. Please reconsult if needs arise.   HPI HPI: 66 year old male admitted 02/23/2017 with SOB and AMS. PMH: AFib, diastolic dysfunction, heart failure, paraplegic (T8 injury 2009), restrictive lung disease, HTN, morbid obesity (388#), CAD, sacral decubitus, recent PNA      SLP Plan  Discharge SLP treatment due to (comment)       Recommendations  Diet recommendations: Regular;Thin liquid Liquids provided via: Straw;Cup Medication Administration: Whole meds with liquid Supervision: Patient able to self feed Compensations: Minimize environmental distractions;Slow rate;Small sips/bites Postural Changes and/or Swallow Maneuvers: Seated upright 90 degrees                Oral Care Recommendations: Oral care QID Follow up Recommendations: None SLP Visit Diagnosis: Dysphagia, pharyngeal phase (R13.13) Plan: Discharge SLP treatment due to (comment)       Celia B. Lake Andes[place], [facility] LLCMSP, CCC-SLP [phone]  [name], Celia Brown 09/17/2016, 12:52 PM

## 2016-09-17 NOTE — Progress Notes (Signed)
Patient refuses CPAP for the night. Machine removed from room. RT will continue to monitor.

## 2016-09-17 NOTE — Progress Notes (Signed)
Spoke with pt's wife concerning HH needs. Mrs Kellie ShropshireGavin [facility], there are not other needs.

## 2016-09-17 NOTE — Progress Notes (Signed)
PROGRESS NOTE    SEVEN DOLLENS  NFA:[id number] DOB: 1950/06/29 DOA: 08/07/2016 PCP: Shirlean Mylar, MD  Brief Narrative:  The patient 66 year old paraplegic since 2009 / T8 injury with OSA but not on CPAP, admitted 5/31 with left-sided community acquired pneumonia, progressed to white out of left lung & acute hypercarbic respiratory failure requiring emergent intubation 6/4. Bronchoscopy performed with removal of thick inspissated secretions from left lung on 6/4 and 6/5 with some improvement in aeration of the left lung. Patient self extubated on 6/6 and then rei-ntubated was subsequently extubated 09/15/16. Hospitalist Service took over 6/12. Patient only complaining of Pain today and is suctioning out his secretions with Yankauer.   Assessment & Plan:   Principal Problem:   Community acquired bacterial pneumonia Active Problems:   Paraplegia following spinal cord injury (HCC)   Anemia   Leukocytosis   Hypoxia   Restrictive lung disease   Morbid obesity (HCC)   CAD (coronary artery disease), native coronary artery   Diastolic dysfunction with chronic heart failure (HCC)   Atrial fibrillation, chronic (HCC)   Acute on chronic respiratory failure with hypoxia and hypercapnia (HCC)   Chronic respiratory failure with hypoxia (HCC)   Pressure injury of skin   Acute on chronic systolic and diastolic heart failure, NYHA class 4 (HCC)   Atelectasis  Acute on chronic respiratory failure w/ hypoxia and hypercarbia in setting of CAP (NOS) and persistent left lunga telectasis/collapse from mucous plugging and concomitant probableOSA /OHS  -Improved as Patient was Extubated 6/10 -C/w Pulmonary Cygiene- IS, flutter valve -C/w DuoNeb 3 mL q6h,and Albuterol 2.5 mg Neb q2hprn -Added Hypertonic Saline Nebs 4 mL x 3 days -C/w Intermittent CXR and repeat in AM -C/w Guaifenesin 1200 mg po BID daily -SLP recommended Regular Diet Thin Liquid  Circulatory Shock -Likely related to mechanical  ventilation and sedation -Improved  Chronic Atrial Fibrillation -C/w Carvedilol 25 mg po BID -C/w Apixaban 5 mg po Per Tube and ASA 81 mg po Daily  Chronic systolic and diastolic HF -C/w Carvedilol 25 mg po BID -C/w Telemetry monitoring  -Resume Lasix 40 mg po BID (home dose 40 mg po TID) -Strict I's/O's, Daily Weights -Patient is +823 mL  CAP and Atelectasis with Mucus Plugging - s/p Fiberoptic Bronchcosopy x 2 with Secretion clearance -S/p Treatment with Full Course of Abx of Zosyn x 10 days as Vancomycin was discontinued as there was no evidence of MRSA -Push pulmonary hygiene as above  -Monitor intermittent CXR  And repeat CXR in AM  OSA / OHS  -Will need follow up for sleep as outpatient.   -Has not been able to tolerate CPAP mask with claustrophobia > could use nasal pillows with chin strap  Paraplegia d/t spinal cord Infarct due to a Myelogram -C/w Supportive care  -PT/OT Evaluate and Treat  -Weaned Seroquel off -C/w Bowel regimen - Senna-Docusate 1 tab po Daily -Received Fleet Enema on 5/31 at 1910  Back Pain/Chronic Pain -Adde Home Acetaminophen-Codeine back at 2 Tab po 4 Times daily instead of Home Dose -C/w Norco 1 tab po q6hprn for Moderate Pain -C/w Tizanidine 4 mg po TID  DVT prophylaxis: Anticoagulated with Apixaban 5 mg per Tube BID Code Status: FULL CODE Family Communication: No Family present at bedside Disposition Plan:  Possibly Home in AM  Consultants:   PCCM/Pulmonary Transfer 09/17/16   Procedures: Bronchoscopy x2l Intubation x2;   Antimicrobials:  Anti-infectives    Start     Dose/Rate Route Frequency Ordered Stop   09/10/16 1400  vancomycin (VANCOCIN) 1,250 mg in sodium chloride 0.9 % 250 mL IVPB  Status:  Discontinued     1,250 mg 166.7 mL/hr over 90 Minutes Intravenous Every 12 hours 09/10/16 1315 09/12/16 0938   09/09/16 2200  amoxicillin-clavulanate (AUGMENTIN) 875-125 MG per tablet 1 tablet  Status:  Discontinued     1 tablet Per  Tube Every 12 hours 09/09/16 1502 09/09/16 1514   09/09/16 1600  piperacillin-tazobactam (ZOSYN) IVPB 3.375 g  Status:  Discontinued     3.375 g 12.5 mL/hr over 240 Minutes Intravenous Every 8 hours 09/09/16 1521 09/15/16 1218   09/09/16 1515  piperacillin-tazo (ZOSYN) NICU IV syringe 200 mg/mL  Status:  Discontinued     75 mg/kg  165.6 kg 124.2 mL/hr over 30 Minutes Intravenous Every 8 hours 09/09/16 1514 09/09/16 1519   09/08/16 1000  amoxicillin-clavulanate (AUGMENTIN) 875-125 MG per tablet 1 tablet  Status:  Discontinued     1 tablet Oral Every 12 hours 09/08/16 0851 09/09/16 1502   09/06/16 0400  vancomycin (VANCOCIN) 1,250 mg in sodium chloride 0.9 % 250 mL IVPB  Status:  Discontinued     1,250 mg 166.7 mL/hr over 90 Minutes Intravenous Every 12 hours 08/30/2016 1926 09/06/16 0854   09/06/16 0000  piperacillin-tazobactam (ZOSYN) IVPB 3.375 g  Status:  Discontinued     3.375 g 12.5 mL/hr over 240 Minutes Intravenous Every 8 hours 08/18/2016 1625 09/08/16 0851   08/06/2016 1630  vancomycin (VANCOCIN) IVPB 1000 mg/200 mL premix     1,000 mg 200 mL/hr over 60 Minutes Intravenous  Once 08/26/2016 1625 08/24/2016 1813   08/25/2016 1445  piperacillin-tazobactam (ZOSYN) IVPB 3.375 g     3.375 g 100 mL/hr over 30 Minutes Intravenous  Once 08/28/2016 1437 08/07/2016 1605   09/02/2016 1445  vancomycin (VANCOCIN) IVPB 1000 mg/200 mL premix     1,000 mg 200 mL/hr over 60 Minutes Intravenous  Once 08/08/2016 1437 08/13/2016 1705     Subjective: Seen and examined and was complaining of Pain. PICC line to be removed after Peripheral IV insertion as it felt as it it was clogging. No nausea or vomiting but still coughing quite a bit of mucous.   Objective: Vitals:   09/17/16 0903 09/17/16 1422 09/17/16 1450 09/17/16 1948  BP:   (!) 123/56   Pulse: 99 95 (!) 106 (!) 101  Resp: 20 20 18 20   Temp:   98.1 F (36.7 C)   TempSrc:   Oral   SpO2: 95% 95% 97% 96%  Weight:      Height:        Intake/Output Summary  (Last 24 hours) at 09/17/16 2037 Last data filed at 09/17/16 1812  Gross per 24 hour  Intake              920 ml  Output             1425 ml  Net             -505 ml   Filed Weights   09/15/16 0500 09/16/16 0500 09/17/16 0430  Weight: (!) 169 kg (372 lb 9.2 oz) (!) 176 kg (388 lb 0.2 oz) (!) 172.5 kg (380 lb 4.7 oz)   Examination: Physical Exam:  Constitutional: WN/WD obese Paraplegic Caucasian male in NAD and appears calm and comfortable Eyes: Lids and conjunctivae normal, sclerae anicteric  ENMT: External Ears, Nose appear normal. Grossly normal hearing. Mucous membranes are moist.  Neck: Appears normal, supple, no cervical masses, normal ROM, no appreciable  thyromegaly, mild JVD but difficult to tell Respiratory: Diminished to auscultation bilaterally, no wheezing, rales, rhonchi or crackles. Normal respiratory effort and patient is not tachypenic. No accessory muscle use.   Cardiovascular: RRR, no murmurs / rubs / gallops. S1 and S2 auscultated. 1+ Lower extremity edema Abdomen: Soft, non-tender, Distended due to body habitus. No masses palpated. No appreciable hepatosplenomegaly. Bowel sounds positive.  GU: Deferred. Musculoskeletal: No clubbing / cyanosis of digits/nails. Paraplegic Skin:  Warm and dry. No rashes noted on limited skin exam but LLE has wound.  Neurologic: CN 2-12 grossly intact with no focal deficits.Romberg sign cerebellar reflexes not assessed.  Psychiatric: Normal judgment and insight. Alert and oriented x 3. Normal mood and appropriate affect.   Data Reviewed: I have personally reviewed following labs and imaging studies  CBC:  Recent Labs Lab 09/13/16 0311 09/14/16 0351 09/15/16 0452 09/16/16 0500 09/17/16 1002  WBC 8.5 7.6 7.7 8.7 7.1  NEUTROABS  --   --   --   --  5.6  HGB 9.9* 9.1* 9.3* 9.5* 9.6*  HCT 32.1* 29.3* 30.8* 31.5* 32.9*  MCV 97.0 96.4 97.5 96.9 102.2*  PLT 110* 110* 128* 127* 154   Basic Metabolic Panel:  Recent Labs Lab  09/13/16 0311 09/14/16 0351 09/15/16 0452 09/16/16 0500 09/17/16 1002  NA 142 144 146* 144 144  K 3.7 3.2* 3.2* 4.0 4.8  CL 101 102 104 106 102  CO2 35* 35* 34* 31 38*  GLUCOSE 145* 132* 136* 102* 139*  BUN 20 17 22* 16 11  CREATININE 0.34* 0.39* 0.47* <0.30* 0.34*  CALCIUM 7.7* 8.1* 8.4* 7.9* 8.4*  MG 1.8 1.8 2.0 2.0 2.1  PHOS 2.5 3.0 3.2 3.4 3.4   GFR: Estimated Creatinine Clearance: 160.8 mL/min (A) (by C-G formula based on SCr of 0.34 mg/dL (L)). Liver Function Tests:  Recent Labs Lab 09/11/16 0350 09/17/16 1002  AST 13* 22  ALT 13* 22  ALKPHOS 60 81  BILITOT 1.0 0.5  PROT 5.2* 6.0*  ALBUMIN 2.7* 3.0*   No results for input(s): LIPASE, AMYLASE in the last 168 hours. No results for input(s): AMMONIA in the last 168 hours. Coagulation Profile: No results for input(s): INR, PROTIME in the last 168 hours. Cardiac Enzymes: No results for input(s): CKTOTAL, CKMB, CKMBINDEX, TROPONINI in the last 168 hours. BNP (last 3 results) No results for input(s): PROBNP in the last 8760 hours. HbA1C: No results for input(s): HGBA1C in the last 72 hours. CBG:  Recent Labs Lab 09/16/16 1125 09/16/16 1532 09/16/16 2031 09/16/16 2347 09/17/16 0426  GLUCAP 124* 135* 129* 140* 133*   Lipid Profile: No results for input(s): CHOL, HDL, LDLCALC, TRIG, CHOLHDL, LDLDIRECT in the last 72 hours. Thyroid Function Tests: No results for input(s): TSH, T4TOTAL, FREET4, T3FREE, THYROIDAB in the last 72 hours. Anemia Panel: No results for input(s): VITAMINB12, FOLATE, FERRITIN, TIBC, IRON, RETICCTPCT in the last 72 hours. Sepsis Labs: No results for input(s): PROCALCITON, LATICACIDVEN in the last 168 hours.  Recent Results (from the past 240 hour(s))  Culture, respiratory (NON-Expectorated)     Status: None   Collection Time: 09/09/16  3:17 PM  Result Value Ref Range Status   Specimen Description TRACHEAL ASPIRATE  Final   Special Requests Normal  Final   Gram Stain   Final     ABUNDANT WBC PRESENT,BOTH PMN AND MONONUCLEAR RARE GRAM POSITIVE COCCI IN PAIRS    Culture   Final    Consistent with normal respiratory flora. Performed at [facility]  Lab, [address], [place], [place] [address]    Report Status 09/12/2016 FINAL  Final  Culture, Urine     Status: None   Collection Time: 09/10/16 12:53 PM  Result Value Ref Range Status   Specimen Description URINE, CATHETERIZED  Final   Special Requests NONE  Final   Culture   Final    NO GROWTH Performed at [facility], [address] [place], [place] [address]    Report Status 09/11/2016 FINAL  Final  C difficile quick scan w PCR reflex     Status: None   Collection Time: 09/14/16  1:11 PM  Result Value Ref Range Status   C Diff antigen NEGATIVE NEGATIVE Final   C Diff toxin NEGATIVE NEGATIVE Final   C Diff interpretation No C. difficile detected.  Final    Radiology Studies: Dg Chest Port 1 View  Result Date: 09/16/2016 CLINICAL DATA:  Initial evaluation for respiratory failure. EXAM: PORTABLE CHEST 1 VIEW COMPARISON:  Prior radiograph from 09/15/2016. FINDINGS: Endotracheal and enteric tubes have been removed. Stable cardiomegaly. Mediastinal silhouette unchanged. Aortic atherosclerosis. Right-sided PICC catheter remains in place. Lungs are hypoinflated. Persisting and pulmonary and interstitial vascular congestion, overall relatively unchanged. Superimposed bibasilar atelectasis and/ or consolidation, also similar. No no pneumothorax. Osseous structures unchanged. IMPRESSION: 1. Interval removal of endotracheal and enteric tubes. 2. Otherwise overall little interval changed pain appearance of the chest with diffuse vascular an interstitial congestion with bibasilar opacities, either atelectasis and/or consolidation. Electronically Signed   By: Rise Mu M.D.   On: 09/16/2016 05:59   Scheduled Meds: . acetaminophen-codeine  2 tablet Oral QID  . apixaban  5 mg Per Tube BID  . aspirin  81  mg Oral Daily  . carvedilol  25 mg Oral BID WC  . Chlorhexidine Gluconate Cloth  6 each Topical Daily  . furosemide  40 mg Oral BID  . guaiFENesin  1,200 mg Oral BID  . ipratropium-albuterol  3 mL Nebulization Q6H  . mouth rinse  15 mL Mouth Rinse BID  . multivitamin with minerals  1 tablet Oral Daily  . pantoprazole  40 mg Oral Daily  . protein supplement shake  11 oz Oral BID BM  . senna-docusate  1 tablet Oral Daily  . sodium chloride flush  10-40 mL Intracatheter Q12H  . sodium chloride HYPERTONIC  4 mL Nebulization Daily  . tiZANidine  4 mg Oral TID   Continuous Infusions: . sodium chloride 20 mL/hr at 09/16/16 1600    LOS: 12 days   Merlene Laughter, DO [facility] Pager [phone]  If 7PM-7AM, please contact night-coverage [email or web address] Password [facility] 09/17/2016, 8:37 PM

## 2016-09-17 NOTE — Progress Notes (Signed)
Pt has decided that he isn't going to wear the CPAP tonight.  RN aware, RT to monitor and assess as needed.

## 2016-09-17 NOTE — Progress Notes (Signed)
PT Cancellation Note / Screen  Patient Details Name: [name] MRN: [id number] DOB: [birth year]      Reason Eval/Treat Not Completed: PT screened, no needs identified, will sign off  Pt reports his wife assists him with care at home. He has a hoyer lift, w/c and handicap accessible bathroom.  Pt states a HH RN assists with his wound care.  No acute PT needs identified.   PT to sign off.   Alazae Crymes,KATHrine E 09/17/2016, 12:00 PM Zenovia JarredKati Ravon Mortellaro, PT, DPT 09/17/2016 Pager: [phone]

## 2016-09-18 ENCOUNTER — Inpatient Hospital Stay (HOSPITAL_COMMUNITY): Payer: 59

## 2016-09-18 LAB — GLUCOSE, CAPILLARY: Glucose-Capillary: 189 mg/dL — ABNORMAL HIGH (ref 65–99)

## 2016-09-18 MED FILL — Medication: Qty: 1 | Status: AC

## 2016-10-06 NOTE — Progress Notes (Signed)
Received telemetry call that patient was bradycardiac with HR of 35 at 01:37. Responded to bedside and pt was unresponsive. Pt was in asystole so Code blue was called and I initiated CPR.

## 2016-10-06 NOTE — ED Provider Notes (Signed)
2:07 AM I was called to a Code Blue on the floor for Mr. [name]. Per the nurse, he became bradycardic on the monitor and went into asytole. CPR started. Given multiple epinephrines and high quality CPR performed. However, he never regained a pulse or an organized rhythm. The code was called at 0158. D/w hospitalist, [name], who will inform wife who is on the way.  Cardiopulmonary Resuscitation (CPR) Procedure Note Directed/Performed by: [name], [name] I personally directed ancillary staff and/or performed CPR in an effort to regain return of spontaneous circulation and to maintain cardiac, neuro and systemic perfusion.     [name], Harold Schools, [name] March 21, 2017 [phone]

## 2016-10-06 NOTE — Death Summary Note (Signed)
DEATH SUMMARY   Patient Details  Name: [name] MRN: [id number] DOB: 08-31-1950  Admission/Discharge Information   Admit Date:  09/09/2016  Date of Death: Date of Death: 09/22/2016  Time of Death: Time of Death: 0158  Length of Stay: Aug 23, 2022  Referring Physician: Shirlean Mylar, MD   Reason(s) for Hospitalization  Shortness of Breath  Diagnoses  Preliminary cause of death:    Cardiopulmonary Arrest likely from Mucous Plugging in the setting of CAP, Persistent Left Lung Atelectasis/Collapse and concomitant OSA/OHS  Secondary Diagnoses (including complications and co-morbidities):   Acute on chronic respiratory failure w/ hypoxia and hypercarbia in setting of CAP (NOS) and persistent left lunga telectasis/collapse from mucous plugging and concomitant probableOSA /OHS   Circulatory Shock  Chronic Atrial Fibrillation  Chronic systolic and diastolic HF  CAP and Atelectasis with Mucus Plugging - s/p Fiberoptic Bronchcosopy x 2 with Secretion clearance  OSA / OHS   Paraplegia d/t spinal cord Infarct due to a Myelogram  Back Pain/Chronic Pain  Principal Problem:   Community acquired bacterial pneumonia Active Problems:   Paraplegia following spinal cord injury (HCC)   Anemia   Leukocytosis   Hypoxia   Restrictive lung disease   Morbid obesity (HCC)   CAD (coronary artery disease), native coronary artery   Diastolic dysfunction with chronic heart failure (HCC)   Atrial fibrillation, chronic (HCC)   Acute on chronic respiratory failure with hypoxia and hypercapnia (HCC)   Chronic respiratory failure with hypoxia (HCC)   Pressure injury of skin   Acute on chronic systolic and diastolic heart failure, NYHA class 4 (HCC)   Atelectasis  Brief Hospital Course (including significant findings, care, treatment, and services provided and events leading to death)  [name] is a 66 y.o. year old male with a PMH of male with medical history significant of atrial  fibrillation, diastolic dysfunction with chronic heart failure, paraplegia following spinal cord injury at T8 level, restrictive lung disease, morbid obesity, and coronary artery disease with a history of a sacral decubitus who presents to the emergency department with complaints of shortness of breath.  Patient has been treated by his primary care physician recently with Levaquin for a pneumonia. Hs wife noticed that he was getting more confused and that he began developing desaturation  noted desaturations as low as the mid to low 80s. One episode as low as 81%. Patient's wife contacted the patient's primary care physician who recommended he come to the emergency department for further evaluation and management.   Given that he has failed outpatient treatment with levofloxacin the emergency department started vancomycin and Zosyn for treatment of community-acquired pneumonia which is complicated by failed outpatient treatment. In the emergency department the patient was noted to be hypoxemic and requiring increased oxygen up to 5 L to obtain 90% however when I saw the patient he was barely able to keep his sats 88% with 5 L. ABG was obtained and patient was placed on BiPAP and PCCM was consulted.   He was admitted 09/10/2022 with left-sided community acquired pneumonia, progressed to white out of left lung &acute hypercarbic respiratory failure requiring emergent intubation 6/4 and transferred to [facility] service. Bronchoscopy performed with removal of thick inspissated secretions from left lung on 6/4 and 6/5 with some improvement in aeration of the left lung. Patient self extubated on 6/6 and then rei-ntubated was subsequently extubated 09/15/16. Hospitalist Service took over 6/12. Patient decompensated the night of the 08/23/2022 and was Bradycardic on Telemetry and when bedside nurse  went to assess the patient was found to be in asystole and respiratory arrest. A code Blue was called and ACLS protocol started. ED MD  ran the code. Multiple Epi's given with high quality CPR. Unable to achieve any ROSC, so code called by ED MD at 0158.    Pertinent Labs and Studies  Significant Diagnostic Studies Dg Abd 1 View  Result Date: 09/10/2016 CLINICAL DATA:  66 year old male with nasogastric tube placement. Initial encounter. EXAM: ABDOMEN - 1 VIEW COMPARISON:  02/19/2014 CT. FINDINGS: Nasogastric tube tip at the expected level of the gastric antrum. Heart may be enlarged. Aortic calcification. Postsurgical changes lumbar spine. IMPRESSION: Nasogastric tube tip at the level of the gastric antrum. Electronically Signed   By: Lacy Duverney M.D.   On: 09/10/2016 15:33   Dg Chest Port 1 View  Result Date: 09/16/2016 CLINICAL DATA:  Initial evaluation for respiratory failure. EXAM: PORTABLE CHEST 1 VIEW COMPARISON:  Prior radiograph from 09/15/2016. FINDINGS: Endotracheal and enteric tubes have been removed. Stable cardiomegaly. Mediastinal silhouette unchanged. Aortic atherosclerosis. Right-sided PICC catheter remains in place. Lungs are hypoinflated. Persisting and pulmonary and interstitial vascular congestion, overall relatively unchanged. Superimposed bibasilar atelectasis and/ or consolidation, also similar. No no pneumothorax. Osseous structures unchanged. IMPRESSION: 1. Interval removal of endotracheal and enteric tubes. 2. Otherwise overall little interval changed pain appearance of the chest with diffuse vascular an interstitial congestion with bibasilar opacities, either atelectasis and/or consolidation. Electronically Signed   By: Rise Mu M.D.   On: 09/16/2016 05:59   Dg Chest Port 1 View  Result Date: 09/15/2016 CLINICAL DATA:  Acute respiratory failure EXAM: PORTABLE CHEST 1 VIEW COMPARISON:  09/14/2016 and prior radiographs FINDINGS: An endotracheal tube with tip 3.7 cm above the carina, NG tube entering the stomach with tip off the field of view and right PICC line with tip overlying the upper SVC  again noted. Cardiomegaly, pulmonary edema, left lower lung consolidation/atelectasis and right basilar atelectasis again noted. There is no evidence of pneumothorax. There has been little interval change since the prior study. IMPRESSION: Unchanged appearance of the chest. Electronically Signed   By: Harmon Pier M.D.   On: 09/15/2016 07:04   Dg Chest Port 1 View  Result Date: 09/14/2016 CLINICAL DATA:  Hypoxia EXAM: PORTABLE CHEST 1 VIEW COMPARISON:  September 13, 2016 FINDINGS: Endotracheal tube tip is 3 cm above the carina. Nasogastric tube tip and side port are below the diaphragm. No pneumothorax. There are bilateral pleural effusions with interstitial edema. There is patchy airspace opacity in the lung bases. There is cardiomegaly with pulmonary venous hypertension. There is aortic atherosclerosis. No adenopathy. No bone lesions. IMPRESSION: Tube positions as described without pneumothorax. Changes of congestive heart failure persists. Airspace opacity in the lung bases may represent alveolar edema or superimposed pneumonia. Both entities may exist concurrently. Appearance essentially stable compared to 1 day prior. Electronically Signed   By: Bretta Bang III M.D.   On: 09/14/2016 07:33   Dg Chest Port 1 View  Result Date: 09/13/2016 CLINICAL DATA:  Respiratory failure. EXAM: PORTABLE CHEST 1 VIEW COMPARISON:  09/12/2016 . FINDINGS: Endotracheal tube, NG tube, right PICC line stable position. Cardiomegaly with bilateral from interstitial prominence and bilateral pleural effusions. Findings consistent CHF. Persistent basilar atelectasis. IMPRESSION: 1. Lines and tubes in stable position. 2. Cardiomegaly with diffuse bilateral pulmonary interstitial prominence and bilateral pleural effusions again noted. Findings consistent with CHF. No interim change. Electronically Signed   By: Maisie Fus  Register   On: 09/13/2016 06:58  Dg Chest Port 1 View  Result Date: 09/12/2016 CLINICAL DATA:  Respiratory failure,  atrial fibrillation, diastolic CHF, restrictive lung disease, shortness of breath, morbid obesity EXAM: PORTABLE CHEST 1 VIEW COMPARISON:  Portable exam 0948 hours compared to 09/11/2016 FINDINGS: Tip of endotracheal tube projects 4.4 cm above carina. Nasogastric tube extends to at least the inferior mediastinum. RIGHT arm PICC line tip projects over proximal SVC. Enlargement of cardiac silhouette with pulmonary vascular congestion. Bibasilar atelectasis and perihilar edema persists. Probable small bibasilar effusions. No pneumothorax. Atherosclerotic calcification aorta. Bones demineralized. IMPRESSION: Pulmonary edema with bibasilar atelectasis and small pleural effusions. Electronically Signed   By: Ulyses Southward M.D.   On: 09/12/2016 10:16   Dg Chest Port 1 View  Result Date: 09/11/2016 CLINICAL DATA:  Intubation. EXAM: PORTABLE CHEST 1 VIEW 4:19 p.m. COMPARISON:  09/12/2014 at 5:11 a.m. FINDINGS: Endotracheal tube is in good position. NG tube tip is below the diaphragm. There is increased atelectasis at the right lung base. There is pulmonary vascular congestion with persistent density at the left lung base which is probably a combination of pleural effusion and atelectasis. IMPRESSION: 1. Endotracheal tube in good position. 2. Increased atelectasis at the right lung base. 3. Persistent atelectasis/effusion at the left lung base. 4. Pulmonary vascular congestion. Electronically Signed   By: Francene Boyers M.D.   On: 09/11/2016 16:46   Dg Chest Port 1 View  Result Date: 09/11/2016 CLINICAL DATA:  Acute on chronic respiratory failure, pneumonia, morbid obesity, CHF. EXAM: PORTABLE CHEST 1 VIEW COMPARISON:  Portable chest x-ray of September 10, 2016 FINDINGS: Aeration of the left lung has improved markedly. The left hemidiaphragm remains obscured in the retrocardiac region dense. Density in the right perihilar region persists. The heart is normal in size. The central pulmonary vascularity is prominent. There is  calcification in the wall of the aortic arch. The endotracheal tube tip lies approximately 5.8 cm above the carina. The esophagogastric tube tip and proximal port project below the GE junction. The PICC line tip projects at the junction of the right and left brachiocephalic veins. IMPRESSION: Marked improvement in the appearance of the left lung with decreased atelectasis. Persistent left lower lobe atelectasis or pneumonia. There is persistent subsegmental atelectasis in the right perihilar region. The support tubes are in stable position. Thoracic aortic atherosclerosis. Electronically Signed   By: David  [place] M.D.   On: 09/11/2016 07:18   Dg Chest Port 1 View  Result Date: 09/10/2016 CLINICAL DATA:  Central line placement EXAM: PORTABLE CHEST 1 VIEW COMPARISON:  09/10/2016 1057 hours FINDINGS: Endotracheal tube and NG tube are stable. Near complete opacification of the left hemithorax is worse. There is shift of the mediastinum to the left. Right PICC is been placed. Tip is in the upper SVC. Subsegmental atelectasis at the right lung base. IMPRESSION: Right PICC placed.  Tip is in the upper SVC Worsening opacification of the left hemithorax, now with evidence of volume loss. Electronically Signed   By: Jolaine Click M.D.   On: 09/10/2016 12:35   Dg Chest Port 1 View  Result Date: 09/10/2016 CLINICAL DATA:  Atelectasis EXAM: PORTABLE CHEST 1 VIEW COMPARISON:  09/10/2016 FINDINGS: Endotracheal tube and NG tube are unchanged. There is improved aeration in the left lung. Continued diffuse left lung airspace disease and probable moderate left effusion. There is cardiomegaly with vascular congestion. IMPRESSION: Improving aeration on the left with continued diffuse left lung airspace disease and moderate left effusion. Cardiomegaly, vascular congestion. Electronically Signed   By:  Charlett Nose M.D.   On: 09/10/2016 11:07   Dg Chest Port 1 View  Result Date: 09/10/2016 CLINICAL DATA:  Follow-up left lung  atelectasis EXAM: PORTABLE CHEST 1 VIEW COMPARISON:  Portable chest x-ray of September 09, 2016 FINDINGS: The left hemithorax remains opacified. Mild shift of mediastinum toward the left is present. The right lung is reasonably well inflated. Linear increased density in the right perihilar region persists. The cardiac silhouette is obscured. The endotracheal tube tip lies 5.5 cm above the carina. The esophagogastric tube tip projects below the inferior margin of the image. IMPRESSION: Stable white out of the left hemithorax compatible with total or near total atelectasis with minimal shift of the mediastinum toward the left. Persistent subsegmental atelectasis or infiltrate in the right perihilar region. The support tubes are in reasonable position. Electronically Signed   By: David  [place] M.D.   On: 09/10/2016 07:17   Portable Chest Xray  Result Date: 09/09/2016 CLINICAL DATA:  Atelectasis. EXAM: PORTABLE CHEST 1 VIEW COMPARISON:  Earlier today at 0951 hours. FINDINGS: Interval intubation. Endotracheal tube terminates 5.4 cm above carina. Nasogastric extends beyond the inferior aspect of the film. Minimal tracheal deviation to the left. Probable cardiomegaly. Left hemithorax whiteout. Right infrahilar atelectasis. No pneumothorax. IMPRESSION: Interval intubation and placement of nasogastric tube. Otherwise, no significant change in left chest whiteout and right infrahilar atelectasis. Electronically Signed   By: Jeronimo Greaves M.D.   On: 09/09/2016 15:43   Dg Chest Port 1 View  Result Date: 09/09/2016 CLINICAL DATA:  Shortness of breath.  Left lung collapse. EXAM: PORTABLE CHEST 1 VIEW COMPARISON:  Yesterday FINDINGS: Complete left chest opacification persists, with volume loss. There is new bandlike opacity at the right base. Right upper lobe shows no signs of edema. No air bronchogram noted. Heart size is of uncertain due to obscuration. IMPRESSION: 1. Continued left chest white out with signs of pulmonary  collapse. 2. New atelectasis at the right base. Electronically Signed   By: Marnee Spring M.D.   On: 09/09/2016 10:25   Dg Chest Port 1 View  Result Date: 09/08/2016 CLINICAL DATA:  Acute on chronic respiratory failure. EXAM: PORTABLE CHEST 1 VIEW COMPARISON:  09/07/2016 FINDINGS: Complete opacification of the left hemithorax is now noted. Right perihilar streaky opacities are identified. There is no evidence of pneumothorax. No other significant change noted. IMPRESSION: Complete opacification of the left hemithorax which may be a combination of effusion/ consolidation/atelectasis. Right perihilar vascular congestion/atelectasis. Electronically Signed   By: Harmon Pier M.D.   On: 09/08/2016 08:37   Dg Chest Port 1 View  Result Date: 09/07/2016 CLINICAL DATA:  Cough and shortness of breath EXAM: PORTABLE CHEST 1 VIEW COMPARISON:  09/02/2016 FINDINGS: Near complete whiteout of the left hemithorax noted. There is some apparent associated left hemithoracic volume loss. Apparent progression of large left pleural effusion. Cardiopericardial silhouette obscured. Vascular congestion noted right lung without right pleural effusion. The visualized bony structures of the thorax are intact. Telemetry leads overlie the chest. IMPRESSION: Interval progression of left lung collapse/ consolidation with pleural effusion. Electronically Signed   By: Kennith Center M.D.   On: 09/07/2016 09:39   Dg Chest Portable 1 View  Result Date: 08/21/2016 CLINICAL DATA:  66 year old presenting with acute onset of shortness of breath. EXAM: PORTABLE CHEST 1 VIEW COMPARISON:  CT chest 06/13/2016, 07/21/2012. Chest x-rays 02/23/2014, 02/19/2014 and earlier. FINDINGS: Markedly suboptimal inspiration which accounts for atelectasis of the right lung base. Confluent airspace consolidation in the left upper  lobe and left lower lobe, likely associated with a left pleural effusion. Cardiac silhouette mildly enlarged for the AP technique,  unchanged. Pulmonary vascularity normal. IMPRESSION: 1. Acute pneumonia involving the left upper lobe and left lower lobe, likely associated with a small left effusion. 2. Markedly suboptimal inspiration accounts for atelectasis at the right lung base. Right lung otherwise clear. 3. Stable mild cardiomegaly without pulmonary edema. Electronically Signed   By: Hulan Saashomas  Lawrence M.D.   On: 07-16-2016 14:29   Dg Abd Portable 1v  Result Date: 09/11/2016 CLINICAL DATA:  OG tube placement. EXAM: PORTABLE ABDOMEN - 1 VIEW COMPARISON:  09/10/2016 FINDINGS: OG tube tip is in the distal stomach, essentially unchanged in position since the prior study. Persistent atelectasis/ effusion at the left lung base. Endotracheal tube in good position. IMPRESSION: OG tube tip in the distal stomach. Electronically Signed   By: Francene BoyersJames  Maxwell M.D.   On: 09/11/2016 16:46   Microbiology No results found for this or any previous visit (from the past 240 hour(s)).  Lab Basic Metabolic Panel: No results for input(s): NA, K, CL, CO2, GLUCOSE, BUN, CREATININE, CALCIUM, MG, PHOS in the last 168 hours. Liver Function Tests: No results for input(s): AST, ALT, ALKPHOS, BILITOT, PROT, ALBUMIN in the last 168 hours. No results for input(s): LIPASE, AMYLASE in the last 168 hours. No results for input(s): AMMONIA in the last 168 hours. CBC: No results for input(s): WBC, NEUTROABS, HGB, HCT, MCV, PLT in the last 168 hours. Cardiac Enzymes: No results for input(s): CKTOTAL, CKMB, CKMBINDEX, TROPONINI in the last 168 hours. Sepsis Labs: No results for input(s): PROCALCITON, WBC, LATICACIDVEN in the last 168 hours.  Procedures/Operations   Bronchoscopy x2 and Intubation x2;  ECHOCARDIOGRAM ------------------------------------------------------------------- Study Conclusions  - Left ventricle: The cavity size was normal. Wall thickness was   increased in a pattern of moderate LVH. Indeterminant diastolic   function (atrial  fibrillation). Systolic function was mildly   reduced. The estimated ejection fraction was in the range of 45%   to 50%. Diffuse hypokinesis. - Aortic valve: There was no stenosis. - Aorta: Dilated aortic root. Aortic root dimension: 43 mm (ED). - Mitral valve: There was no significant regurgitation. - Right ventricle: The cavity size was moderately dilated. Systolic   function was mildly reduced. - Tricuspid valve: Peak RV-RA gradient (S): 80 mm Hg. - Systemic veins: The IVC was not visualized. - Pericardium, extracardiac: A trivial pericardial effusion was   identified posterior to the heart.  Impressions:  - Technically difficult study with poor acoustic windows. The   patient was in atrial fibrillation. Normal LV size with mild LV   hypertrophy. EF 45-50%, mild diffuse hypokinesis. The RV was   poorly visualized but probably moderately dilated with mildly   decreased systolic function. Severe pulmonary hypertension.  Heloise BeechamOmair Latif Allizon Woznick 09/27/2016, 6:09 PM

## 2016-10-06 NOTE — Progress Notes (Addendum)
Code blue called on pt at 0139. Per staff, pt was bradycardic on tele and when RN went to bedside, pt was in asystole and respiratory arrested. ACLS protocol started. ED MD ran the code. Multiple Epi's given with high quality CPR. Unable to achieve any ROSC, so code called by ED MD at 0158.  NP was called to bedside after code was finished. NP awaiting wife's arrival as wife is unaware that pt has expired.  [facility] NP [facility] Wife informed of event and death of pt upon her arrival. Condolences and comforting offered by staff members. Wife thanked staff for the care provided to her husband. Death certificate completed and given to [place], [facility] KJKG, NP [facility]

## 2016-10-06 DEATH — deceased

## 2017-01-21 LAB — BLOOD GAS, ARTERIAL
Drawn by: 225631
O2 Content: 5 L/min
O2 SAT: 94.7 %
PATIENT TEMPERATURE: 98
PH ART: 7.163 — AB (ref 7.350–7.450)
PO2 ART: 87.4 mmHg (ref 83.0–108.0)
RATE: 38 resp/min

## 2018-05-09 IMAGING — DX DG ABD PORTABLE 1V
1 series · 1 of 1 positions shown · non-contrast
Comparison: 09/10/2016

CLINICAL DATA: OG tube placement.

EXAM:
PORTABLE ABDOMEN - 1 VIEW

[abdomen kub]
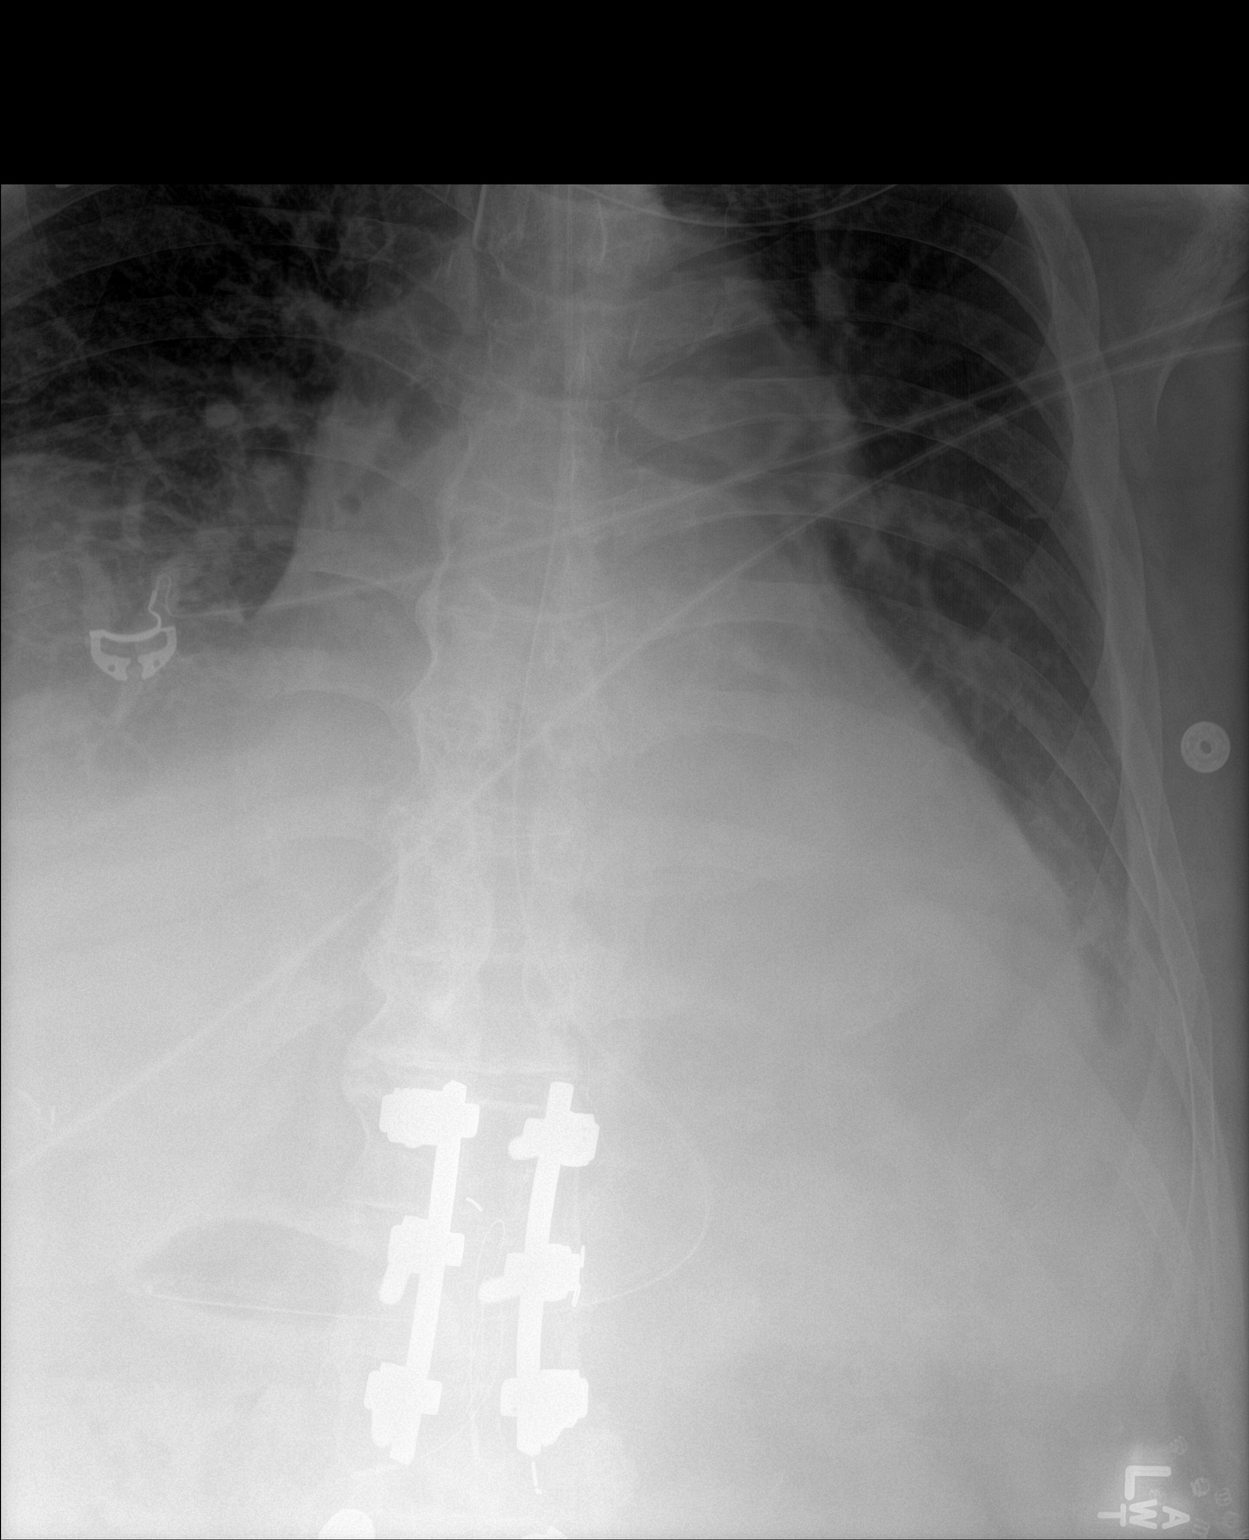

[1 of 1 positions shown; findings below may reference images not displayed]

FINDINGS: OG tube tip is in the distal stomach, essentially unchanged in
position since the prior study.

Persistent atelectasis/ effusion at the left lung base. Endotracheal
tube in good position.
IMPRESSION: OG tube tip in the distal stomach.

## 2018-05-10 IMAGING — DX DG CHEST 1V PORT
2 series · 2 of 2 positions shown · non-contrast
Comparison: Portable exam 9979 hours compared to 09/11/2016

CLINICAL DATA: Respiratory failure, atrial fibrillation, diastolic
CHF, restrictive lung disease, shortness of breath, morbid obesity

EXAM:
PORTABLE CHEST 1 VIEW

[chest ap (1 of 2)]
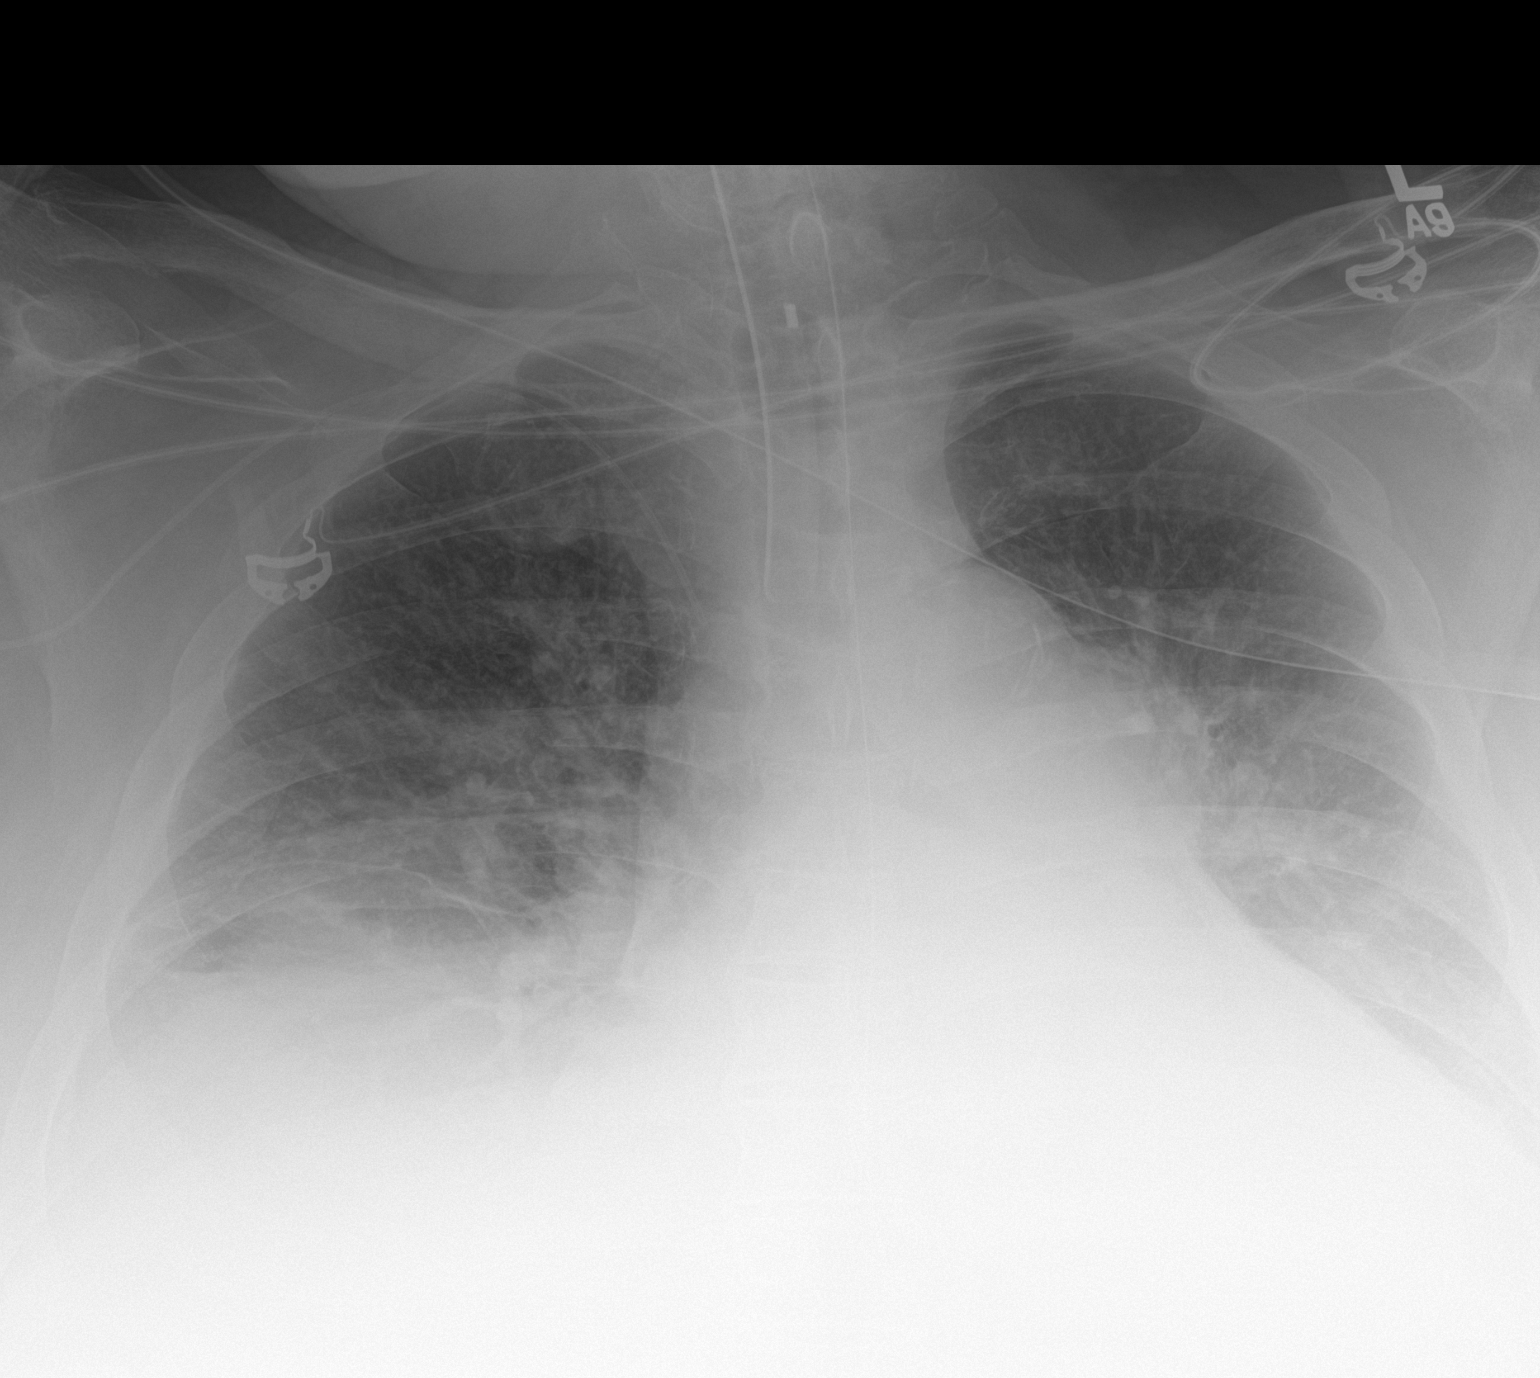

[chest ap (2 of 2)]
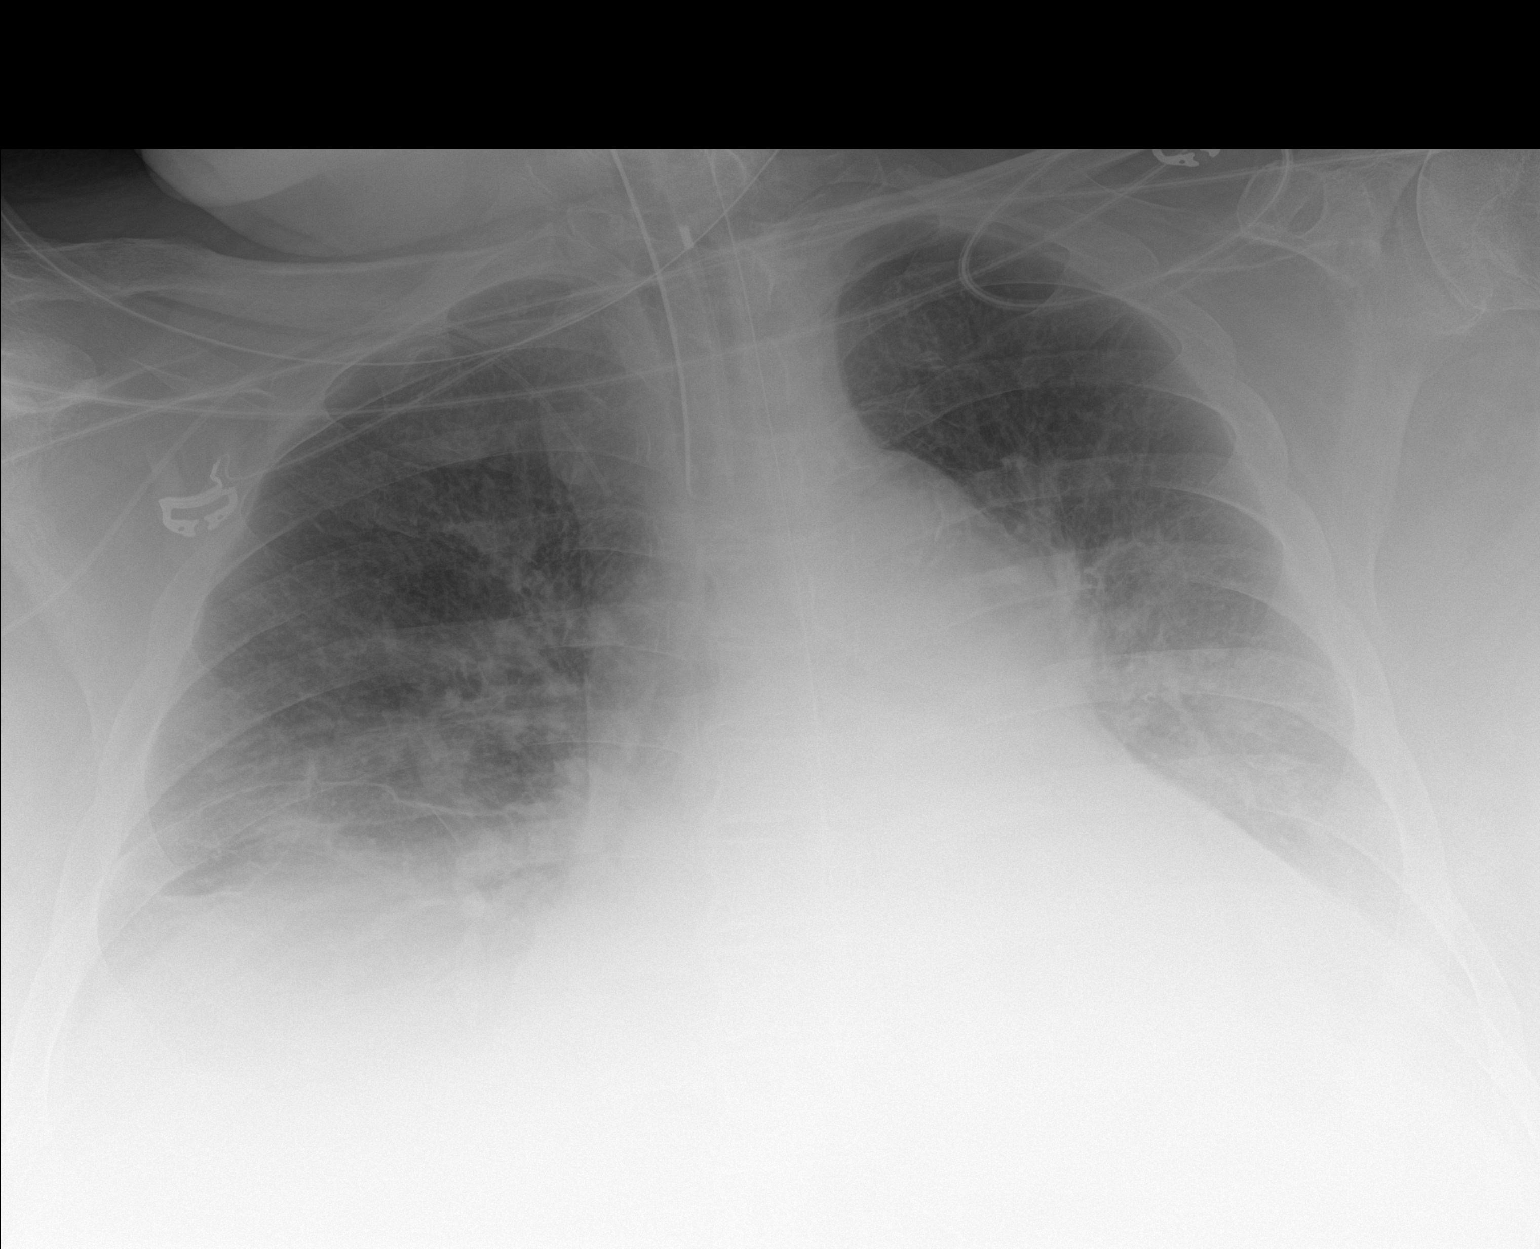

[2 of 2 positions shown; findings below may reference images not displayed]

FINDINGS: Tip of endotracheal tube projects 4.4 cm above carina.

Nasogastric tube extends to at least the inferior mediastinum.

RIGHT arm PICC line tip projects over proximal SVC.

Enlargement of cardiac silhouette with pulmonary vascular
congestion.

Bibasilar atelectasis and perihilar edema persists.

Probable small bibasilar effusions.

No pneumothorax.

Atherosclerotic calcification aorta.

Bones demineralized.
IMPRESSION: Pulmonary edema with bibasilar atelectasis and small pleural
effusions.

## 2018-05-11 IMAGING — DX DG CHEST 1V PORT
1 series · 1 of 1 positions shown · non-contrast
Comparison: 09/12/2016 .

CLINICAL DATA: Respiratory failure.

EXAM:
PORTABLE CHEST 1 VIEW

[chest ap]
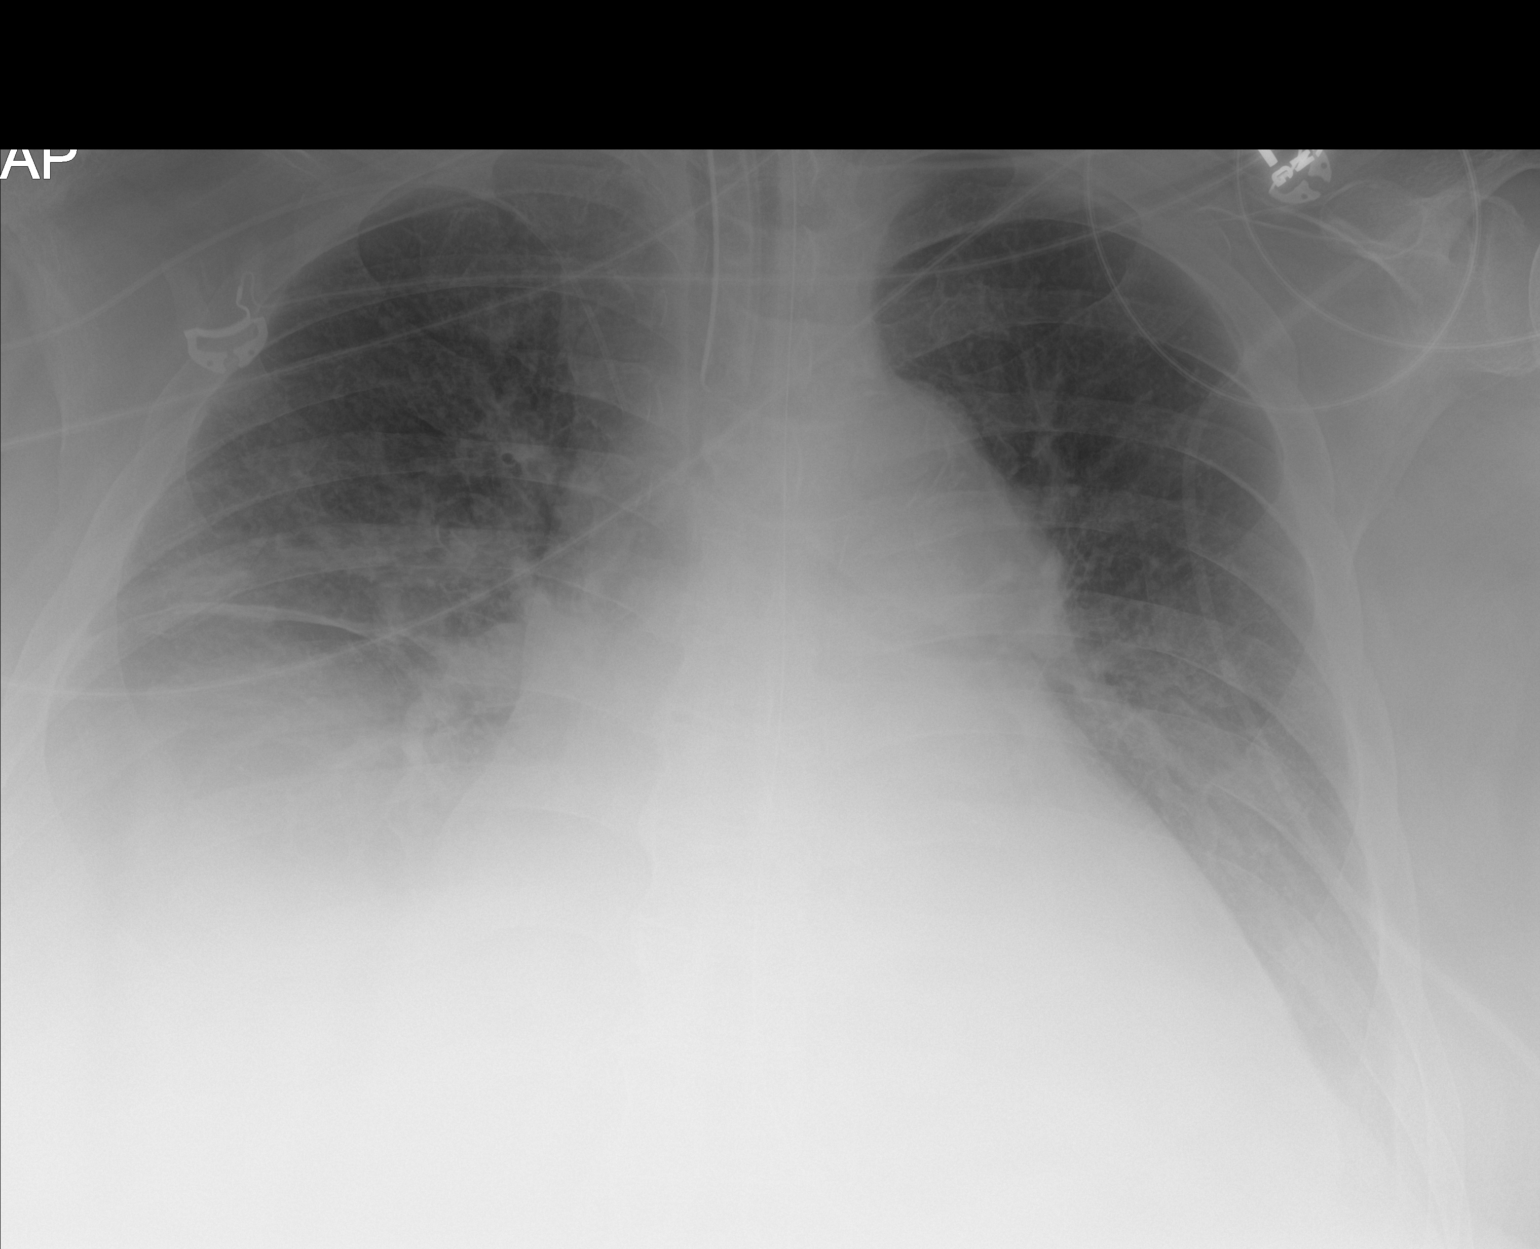

[1 of 1 positions shown; findings below may reference images not displayed]

FINDINGS: Endotracheal tube, NG tube, right PICC line stable position.
Cardiomegaly with bilateral from interstitial prominence and
bilateral pleural effusions. Findings consistent CHF. Persistent
basilar atelectasis.
IMPRESSION: 1. Lines and tubes in stable position.

2. Cardiomegaly with diffuse bilateral pulmonary interstitial
prominence and bilateral pleural effusions again noted. Findings
consistent with CHF. No interim change.

## 2018-05-13 IMAGING — DX DG CHEST 1V PORT
1 series · 1 of 1 positions shown · non-contrast
Comparison: 09/14/2016 and prior radiographs

CLINICAL DATA: Acute respiratory failure

EXAM:
PORTABLE CHEST 1 VIEW

[chest ap]
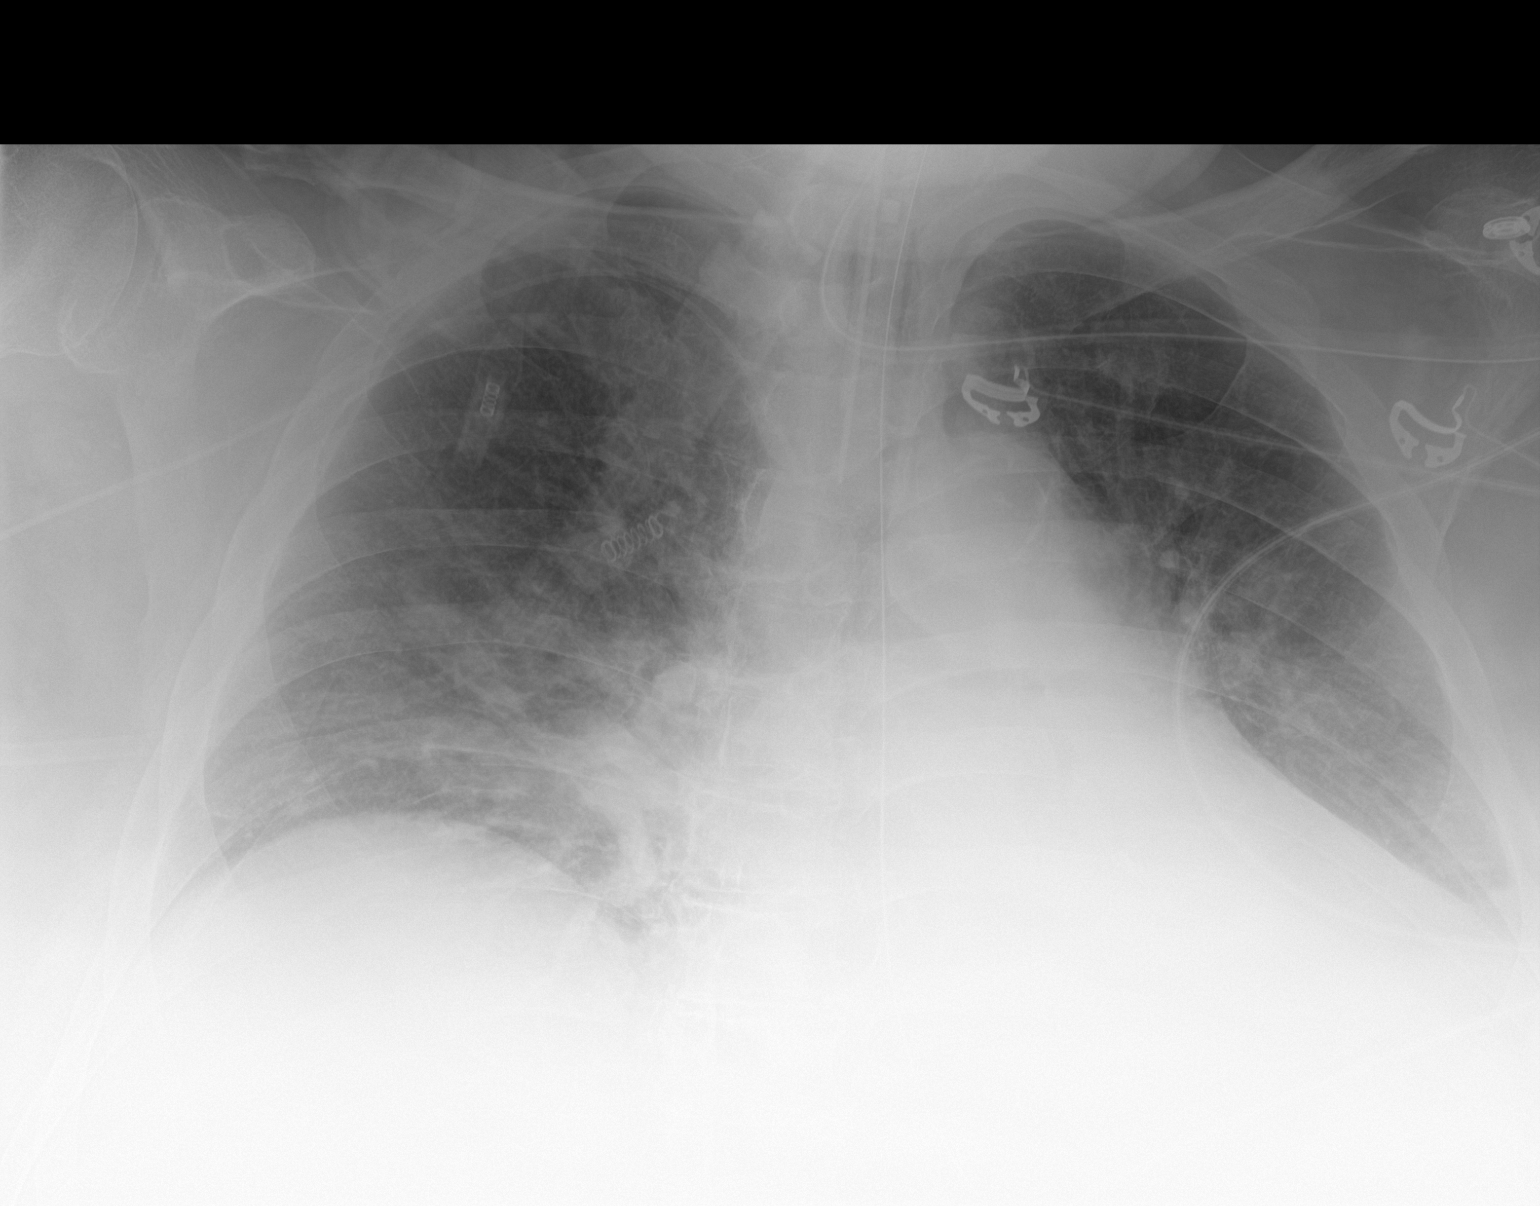

[1 of 1 positions shown; findings below may reference images not displayed]

FINDINGS: An endotracheal tube with tip 3.7 cm above the carina, NG tube
entering the stomach with tip off the field of view and right PICC
line with tip overlying the upper SVC again noted.

Cardiomegaly, pulmonary edema, left lower lung
consolidation/atelectasis and right basilar atelectasis again noted.

There is no evidence of pneumothorax. There has been little interval
change since the prior study.
IMPRESSION: Unchanged appearance of the chest.
